# Patient Record
Sex: Male | Born: 1935 | Race: White | Hispanic: No | Smoking: Former smoker
Health system: Southern US, Community
[De-identification: ages and names within clinical notes are randomized; demographics above are authoritative.]

## PROBLEM LIST (undated history)

## (undated) DIAGNOSIS — J90 Pleural effusion, not elsewhere classified: Secondary | ICD-10-CM

## (undated) DIAGNOSIS — I714 Abdominal aortic aneurysm, without rupture, unspecified: Secondary | ICD-10-CM

## (undated) DIAGNOSIS — I4891 Unspecified atrial fibrillation: Secondary | ICD-10-CM

## (undated) DIAGNOSIS — N529 Male erectile dysfunction, unspecified: Secondary | ICD-10-CM

## (undated) DIAGNOSIS — K769 Liver disease, unspecified: Secondary | ICD-10-CM

## (undated) DIAGNOSIS — E7800 Pure hypercholesterolemia, unspecified: Secondary | ICD-10-CM

## (undated) DIAGNOSIS — N400 Enlarged prostate without lower urinary tract symptoms: Secondary | ICD-10-CM

## (undated) DIAGNOSIS — N62 Hypertrophy of breast: Secondary | ICD-10-CM

## (undated) DIAGNOSIS — R011 Cardiac murmur, unspecified: Secondary | ICD-10-CM

## (undated) DIAGNOSIS — J8410 Pulmonary fibrosis, unspecified: Secondary | ICD-10-CM

## (undated) DIAGNOSIS — M1990 Unspecified osteoarthritis, unspecified site: Secondary | ICD-10-CM

## (undated) DIAGNOSIS — I719 Aortic aneurysm of unspecified site, without rupture: Secondary | ICD-10-CM

## (undated) DIAGNOSIS — K449 Diaphragmatic hernia without obstruction or gangrene: Secondary | ICD-10-CM

## (undated) DIAGNOSIS — I4510 Unspecified right bundle-branch block: Secondary | ICD-10-CM

## (undated) DIAGNOSIS — IMO0001 Reserved for inherently not codable concepts without codable children: Secondary | ICD-10-CM

## (undated) DIAGNOSIS — H353290 Exudative age-related macular degeneration, unspecified eye, stage unspecified: Secondary | ICD-10-CM

## (undated) DIAGNOSIS — S72002A Fracture of unspecified part of neck of left femur, initial encounter for closed fracture: Secondary | ICD-10-CM

## (undated) DIAGNOSIS — G629 Polyneuropathy, unspecified: Secondary | ICD-10-CM

## (undated) DIAGNOSIS — R7989 Other specified abnormal findings of blood chemistry: Secondary | ICD-10-CM

## (undated) DIAGNOSIS — E119 Type 2 diabetes mellitus without complications: Secondary | ICD-10-CM

## (undated) DIAGNOSIS — R778 Other specified abnormalities of plasma proteins: Secondary | ICD-10-CM

## (undated) HISTORY — PX: NOSE SURGERY: SHX723

## (undated) HISTORY — DX: Diaphragmatic hernia without obstruction or gangrene: K44.9

## (undated) HISTORY — DX: Exudative age-related macular degeneration, unspecified eye, stage unspecified: H35.3290

## (undated) HISTORY — DX: Unspecified right bundle-branch block: I45.10

## (undated) HISTORY — DX: Pulmonary fibrosis, unspecified: J84.10

## (undated) HISTORY — DX: Pure hypercholesterolemia, unspecified: E78.00

## (undated) HISTORY — DX: Abdominal aortic aneurysm, without rupture, unspecified: I71.40

## (undated) HISTORY — PX: SHOULDER SURGERY: SHX246

## (undated) HISTORY — DX: Male erectile dysfunction, unspecified: N52.9

## (undated) HISTORY — DX: Hypertrophy of breast: N62

## (undated) HISTORY — DX: Liver disease, unspecified: K76.9

## (undated) HISTORY — DX: Polyneuropathy, unspecified: G62.9

## (undated) HISTORY — DX: Cardiac murmur, unspecified: R01.1

## (undated) HISTORY — DX: Type 2 diabetes mellitus without complications: E11.9

## (undated) HISTORY — PX: COLOSTOMY REVERSAL: SHX5782

## (undated) HISTORY — DX: Abdominal aortic aneurysm, without rupture: I71.4

## (undated) HISTORY — DX: Unspecified atrial fibrillation: I48.91

---

## 1958-07-29 HISTORY — PX: COLOSTOMY: SHX63

## 2004-05-28 ENCOUNTER — Encounter: Admission: RE | Admit: 2004-05-28 | Discharge: 2004-05-28 | Payer: Self-pay | Admitting: Geriatric Medicine

## 2006-11-03 ENCOUNTER — Encounter: Admission: RE | Admit: 2006-11-03 | Discharge: 2006-11-03 | Payer: Self-pay | Admitting: Geriatric Medicine

## 2006-11-20 ENCOUNTER — Encounter: Admission: RE | Admit: 2006-11-20 | Discharge: 2006-11-20 | Payer: Self-pay | Admitting: Geriatric Medicine

## 2006-11-27 ENCOUNTER — Ambulatory Visit (HOSPITAL_COMMUNITY): Admission: RE | Admit: 2006-11-27 | Discharge: 2006-11-27 | Payer: Self-pay | Admitting: Chiropractic Medicine

## 2007-06-03 ENCOUNTER — Encounter: Admission: RE | Admit: 2007-06-03 | Discharge: 2007-06-03 | Payer: Self-pay | Admitting: Geriatric Medicine

## 2007-07-15 ENCOUNTER — Encounter: Admission: RE | Admit: 2007-07-15 | Discharge: 2007-07-15 | Payer: Self-pay | Admitting: Gastroenterology

## 2008-07-07 ENCOUNTER — Encounter: Admission: RE | Admit: 2008-07-07 | Discharge: 2008-07-07 | Payer: Self-pay | Admitting: Geriatric Medicine

## 2009-07-10 ENCOUNTER — Encounter: Admission: RE | Admit: 2009-07-10 | Discharge: 2009-07-10 | Payer: Self-pay | Admitting: Geriatric Medicine

## 2010-07-25 ENCOUNTER — Ambulatory Visit: Payer: Self-pay | Admitting: Cardiology

## 2011-07-19 ENCOUNTER — Other Ambulatory Visit (HOSPITAL_COMMUNITY): Payer: Self-pay | Admitting: Geriatric Medicine

## 2011-08-05 ENCOUNTER — Ambulatory Visit (HOSPITAL_COMMUNITY)
Admission: RE | Admit: 2011-08-05 | Discharge: 2011-08-05 | Disposition: A | Discharge status: Home / Self Care | Payer: Medicare Other | Source: Ambulatory Visit | Attending: Geriatric Medicine | Admitting: Geriatric Medicine

## 2011-08-05 DIAGNOSIS — K224 Dyskinesia of esophagus: Secondary | ICD-10-CM | POA: Insufficient documentation

## 2011-08-05 DIAGNOSIS — R1310 Dysphagia, unspecified: Secondary | ICD-10-CM | POA: Insufficient documentation

## 2011-08-05 DIAGNOSIS — K225 Diverticulum of esophagus, acquired: Secondary | ICD-10-CM | POA: Insufficient documentation

## 2013-08-18 ENCOUNTER — Other Ambulatory Visit: Payer: Self-pay | Admitting: Geriatric Medicine

## 2013-08-18 DIAGNOSIS — I714 Abdominal aortic aneurysm, without rupture, unspecified: Secondary | ICD-10-CM

## 2013-08-18 DIAGNOSIS — N281 Cyst of kidney, acquired: Secondary | ICD-10-CM

## 2013-08-23 ENCOUNTER — Other Ambulatory Visit: Payer: Self-pay | Admitting: Geriatric Medicine

## 2013-08-23 DIAGNOSIS — I714 Abdominal aortic aneurysm, without rupture, unspecified: Secondary | ICD-10-CM

## 2013-08-25 ENCOUNTER — Other Ambulatory Visit: Payer: Medicare Other

## 2013-08-26 ENCOUNTER — Other Ambulatory Visit: Payer: Self-pay | Admitting: Nurse Practitioner

## 2013-08-26 ENCOUNTER — Ambulatory Visit
Admission: RE | Admit: 2013-08-26 | Discharge: 2013-08-26 | Disposition: A | Discharge status: Home / Self Care | Payer: Medicare Other | Source: Ambulatory Visit | Attending: Nurse Practitioner | Admitting: Nurse Practitioner

## 2013-08-26 DIAGNOSIS — J069 Acute upper respiratory infection, unspecified: Secondary | ICD-10-CM

## 2013-08-27 ENCOUNTER — Ambulatory Visit
Admission: RE | Admit: 2013-08-27 | Discharge: 2013-08-27 | Disposition: A | Discharge status: Home / Self Care | Payer: Medicare Other | Source: Ambulatory Visit | Attending: Geriatric Medicine | Admitting: Geriatric Medicine

## 2013-08-27 ENCOUNTER — Other Ambulatory Visit: Payer: Self-pay | Admitting: Geriatric Medicine

## 2013-08-27 DIAGNOSIS — I714 Abdominal aortic aneurysm, without rupture, unspecified: Secondary | ICD-10-CM

## 2013-08-27 DIAGNOSIS — R918 Other nonspecific abnormal finding of lung field: Secondary | ICD-10-CM

## 2013-08-27 MED ORDER — IOHEXOL 300 MG/ML  SOLN
125.0000 mL | Freq: Once | INTRAMUSCULAR | Status: AC | PRN
  Administered 2013-08-27: 125 mL via INTRAVENOUS

## 2013-08-27 MED ORDER — IOHEXOL 300 MG/ML  SOLN: 100.0000 mL | Freq: Once | INTRAMUSCULAR | Status: DC | PRN

## 2013-08-30 ENCOUNTER — Ambulatory Visit
Admission: RE | Admit: 2013-08-30 | Discharge: 2013-08-30 | Disposition: A | Discharge status: Home / Self Care | Payer: Medicare Other | Source: Ambulatory Visit | Attending: Geriatric Medicine | Admitting: Geriatric Medicine

## 2013-08-30 DIAGNOSIS — R918 Other nonspecific abnormal finding of lung field: Secondary | ICD-10-CM

## 2013-08-30 MED ORDER — IOHEXOL 300 MG/ML  SOLN
75.0000 mL | Freq: Once | INTRAMUSCULAR | Status: AC | PRN
  Administered 2013-08-30: 75 mL via INTRAVENOUS

## 2013-09-24 ENCOUNTER — Encounter: Payer: Self-pay | Admitting: *Deleted

## 2013-09-27 ENCOUNTER — Encounter: Payer: Self-pay | Admitting: Pulmonary Disease

## 2013-09-27 ENCOUNTER — Ambulatory Visit (INDEPENDENT_AMBULATORY_CARE_PROVIDER_SITE_OTHER): Payer: Medicare Other | Admitting: Pulmonary Disease

## 2013-09-27 ENCOUNTER — Other Ambulatory Visit (INDEPENDENT_AMBULATORY_CARE_PROVIDER_SITE_OTHER): Payer: Medicare Other

## 2013-09-27 VITALS — BP 128/60 | HR 55 | Temp 97.4°F | Ht 71.25 in | Wt 209.0 lb

## 2013-09-27 DIAGNOSIS — J849 Interstitial pulmonary disease, unspecified: Secondary | ICD-10-CM

## 2013-09-27 DIAGNOSIS — R918 Other nonspecific abnormal finding of lung field: Secondary | ICD-10-CM

## 2013-09-27 DIAGNOSIS — J8410 Pulmonary fibrosis, unspecified: Secondary | ICD-10-CM

## 2013-09-27 LAB — RHEUMATOID FACTOR: Rhuematoid fact SerPl-aCnc: 39 IU/mL — ABNORMAL HIGH (ref <=14)

## 2013-09-27 LAB — C-REACTIVE PROTEIN: CRP: 1 mg/dL (ref 0.5–20.0)

## 2013-09-27 LAB — SEDIMENTATION RATE: Sed Rate: 26 mm/hr — ABNORMAL HIGH (ref 0–22)

## 2013-09-27 NOTE — Patient Instructions (Signed)
Will check bloodwork to look for autoimmune diseases. Will schedule for breathing studies in 2-3 weeks, with followup with me on the same day to review.

## 2013-09-27 NOTE — Addendum Note (Signed)
Addended by: Virl Cagey on: 09/27/2013 03:10 PM   Modules accepted: Orders

## 2013-09-27 NOTE — Assessment & Plan Note (Addendum)
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 This dates back to at least 2008, but I cannot find a chest CT for comparison in order to determine disease progression.  He denies any of the classic causes for pulmonary fibrosis, and therefore this most likely represents idiopathic pulmonary fibrosis. We do need to check autoimmune blood work, and also need to do pulmonary function studies to get some idea how much this may be impacting his lung function. I will then see the patient back, and we can discuss prognosis and whether to consider various treatment options.  My only other thought is whether some of this could be due to chronic microaspiration.  The pt does have a h/o esophageal dysmotility on BS, but denies issues with aspiration.

## 2013-09-27 NOTE — Progress Notes (Signed)
Subjective:    Patient ID: [name] [name], male    DOB: 17-May-1927, 80 y.o.   MRN: [id number]  HPI The patient is an 80 year old male who I've been asked to see for an abnormal CT chest. He recently was found to have interstitial changes on a chest x-ray, and this was followed by a CT chest. This showed subpleural fibrosis, as well as honeycombing and traction bronchiectasis. The pattern was most consistent with usual interstitial pneumonitis. He was also noted to have bilateral pleural based densities which involved the major fissure, and it is unclear whether this is part of his overall fibrotic process. A review of his x-ray shows the patient had some degree of fibrosis dating back to an abdominal CT in 2008. Currently, the patient feels that his exertional tolerance is similar to the past with respect to shortness of breath, but has noticed that he gets much more fatigued. He describes a 3 block dyspnea on exertion at a moderate pace on flat ground, and will also get winded walking up one flight of stairs. He denies any significant cough except in the mornings upon arising, and it is typically dry. He will produce mucus that he's had issues with postnasal drip. The patient denies ever being on Macrodantin or amiodarone for any prolonged period of time. He has never been diagnosed with rheumatoid arthritis or other autoimmune process. He has no family history for this either. He does have an issue with swallowing at times, but denies any problem with aspiration. He did have a barium swallow in 2013 which showed esophageal dysmotility. He has never worked in Psychologist, educational, and denies any exposure to asbestos. His father did run an Geophysicist/field seismologist, and he helped out with this. He is unsure if he ever did significant brake work.  The patient does have a long standing history of smoking, and continues to do so.   Review of Systems  Constitutional: Negative for fever and unexpected weight change.   HENT: Positive for rhinorrhea. Negative for congestion, dental problem, ear pain, nosebleeds, postnasal drip, sinus pressure, sneezing, sore throat and trouble swallowing.   Eyes: Negative for redness and itching.  Respiratory: Negative for cough, chest tightness, shortness of breath and wheezing.        Increased mucus  Cardiovascular: Negative for palpitations and leg swelling.  Gastrointestinal: Negative for nausea and vomiting.  Genitourinary: Negative for dysuria.  Musculoskeletal: Negative for joint swelling.  Skin: Negative for rash.  Neurological: Negative for headaches.  Hematological: Does not bruise/bleed easily.  Psychiatric/Behavioral: Negative for dysphoric mood. The patient is not nervous/anxious.        Objective:   Physical Exam Constitutional:  Thin male, no acute distress  HENT:  Nares patent without discharge  Oropharynx without exudate, palate and uvula are normal  Eyes:  Perrla, eomi, no scleral icterus  Neck:  No JVD, no TMG  Cardiovascular:  Normal rate, regular rhythm, no rubs or gallops.  1/6 sem        Intact distal pulses but dimished.  Pulmonary :  Normal breath sounds, no stridor or respiratory distress   No rhonchi, or wheezing.  Crackles both bases.   Abdominal:  Soft, nondistended, bowel sounds present.  No tenderness noted.   Musculoskeletal:  Minimal ankle edema noted.  Lymph Nodes:  No cervical lymphadenopathy noted  Skin:  No cyanosis noted  Neurologic:  Alert, appropriate, moves all 4 extremities without obvious deficit.         Assessment & Plan:

## 2013-09-27 NOTE — Assessment & Plan Note (Signed)
The patient has bilateral pleural based densities that are associated with the major fissure, but he denies any history of asbestos exposure. The abnormality on the right is a little more concerning, and I cannot exclude the possibility of a pleural based mass given his prior smoking history. We will need to follow this with a repeat CT in approximately 6 months.

## 2013-09-28 LAB — CYCLIC CITRUL PEPTIDE ANTIBODY, IGG: Cyclic Citrullin Peptide Ab: >300 U/mL — ABNORMAL HIGH (ref 0.0–5.0)

## 2013-09-28 LAB — ANA: ANA: NEGATIVE

## 2013-09-28 LAB — ANTI-SCLERODERMA ANTIBODY: Scleroderma (Scl-70) (ENA) Antibody, IgG: <1

## 2013-09-28 LAB — CK TOTAL AND CKMB (NOT AT ARMC)
CK, MB: 1.1 ng/mL (ref 0.0–5.0)
Total CK: 28 U/L (ref 7–232)

## 2013-09-28 LAB — JO-1 ANTIBODY-IGG: Jo-1 Antibody, IgG: <1

## 2013-09-28 LAB — RNP ANTIBODY: Ribonucleic Protein(ENA) Antibody, IgG: <1

## 2013-09-29 LAB — ALDOLASE: ALDOLASE: 5.3 U/L (ref <=8.1)

## 2013-10-01 ENCOUNTER — Emergency Department (HOSPITAL_COMMUNITY): Payer: Medicare Other

## 2013-10-01 ENCOUNTER — Emergency Department (HOSPITAL_COMMUNITY)
Admission: EM | Admit: 2013-10-01 | Discharge: 2013-10-01 | Disposition: A | Discharge status: Home / Self Care | Payer: Medicare Other | Attending: Emergency Medicine | Admitting: Emergency Medicine

## 2013-10-01 ENCOUNTER — Encounter (HOSPITAL_COMMUNITY): Payer: Self-pay | Admitting: Emergency Medicine

## 2013-10-01 DIAGNOSIS — W010XXA Fall on same level from slipping, tripping and stumbling without subsequent striking against object, initial encounter: Secondary | ICD-10-CM | POA: Insufficient documentation

## 2013-10-01 DIAGNOSIS — S62639A Displaced fracture of distal phalanx of unspecified finger, initial encounter for closed fracture: Secondary | ICD-10-CM | POA: Insufficient documentation

## 2013-10-01 DIAGNOSIS — Z7982 Long term (current) use of aspirin: Secondary | ICD-10-CM | POA: Insufficient documentation

## 2013-10-01 DIAGNOSIS — S62509A Fracture of unspecified phalanx of unspecified thumb, initial encounter for closed fracture: Secondary | ICD-10-CM

## 2013-10-01 DIAGNOSIS — N529 Male erectile dysfunction, unspecified: Secondary | ICD-10-CM | POA: Insufficient documentation

## 2013-10-01 DIAGNOSIS — E119 Type 2 diabetes mellitus without complications: Secondary | ICD-10-CM | POA: Insufficient documentation

## 2013-10-01 DIAGNOSIS — R42 Dizziness and giddiness: Secondary | ICD-10-CM | POA: Insufficient documentation

## 2013-10-01 DIAGNOSIS — Z8709 Personal history of other diseases of the respiratory system: Secondary | ICD-10-CM | POA: Insufficient documentation

## 2013-10-01 DIAGNOSIS — Z791 Long term (current) use of non-steroidal anti-inflammatories (NSAID): Secondary | ICD-10-CM | POA: Insufficient documentation

## 2013-10-01 DIAGNOSIS — H353290 Exudative age-related macular degeneration, unspecified eye, stage unspecified: Secondary | ICD-10-CM | POA: Insufficient documentation

## 2013-10-01 DIAGNOSIS — W1809XA Striking against other object with subsequent fall, initial encounter: Secondary | ICD-10-CM | POA: Insufficient documentation

## 2013-10-01 DIAGNOSIS — N62 Hypertrophy of breast: Secondary | ICD-10-CM | POA: Insufficient documentation

## 2013-10-01 DIAGNOSIS — E7800 Pure hypercholesterolemia, unspecified: Secondary | ICD-10-CM | POA: Insufficient documentation

## 2013-10-01 DIAGNOSIS — Y9289 Other specified places as the place of occurrence of the external cause: Secondary | ICD-10-CM | POA: Insufficient documentation

## 2013-10-01 DIAGNOSIS — Z79899 Other long term (current) drug therapy: Secondary | ICD-10-CM | POA: Insufficient documentation

## 2013-10-01 DIAGNOSIS — S0990XA Unspecified injury of head, initial encounter: Secondary | ICD-10-CM | POA: Insufficient documentation

## 2013-10-01 DIAGNOSIS — W19XXXA Unspecified fall, initial encounter: Secondary | ICD-10-CM

## 2013-10-01 DIAGNOSIS — Y9389 Activity, other specified: Secondary | ICD-10-CM | POA: Insufficient documentation

## 2013-10-01 DIAGNOSIS — Z8719 Personal history of other diseases of the digestive system: Secondary | ICD-10-CM | POA: Insufficient documentation

## 2013-10-01 DIAGNOSIS — I4891 Unspecified atrial fibrillation: Secondary | ICD-10-CM | POA: Insufficient documentation

## 2013-10-01 DIAGNOSIS — R011 Cardiac murmur, unspecified: Secondary | ICD-10-CM | POA: Insufficient documentation

## 2013-10-01 DIAGNOSIS — F17200 Nicotine dependence, unspecified, uncomplicated: Secondary | ICD-10-CM | POA: Insufficient documentation

## 2013-10-01 LAB — CBC WITH DIFFERENTIAL/PLATELET
Basophils Absolute: 0 10*3/uL (ref 0.0–0.1)
Basophils Relative: 0 % (ref 0–1)
Eosinophils Absolute: 0 10*3/uL (ref 0.0–0.7)
Eosinophils Relative: 1 % (ref 0–5)
HEMATOCRIT: 35.2 % — AB (ref 39.0–52.0)
Hemoglobin: 12.2 g/dL — ABNORMAL LOW (ref 13.0–17.0)
LYMPHS ABS: 1.3 10*3/uL (ref 0.7–4.0)
Lymphocytes Relative: 22 % (ref 12–46)
MCH: 30.7 pg (ref 26.0–34.0)
MCHC: 34.7 g/dL (ref 30.0–36.0)
MCV: 88.4 fL (ref 78.0–100.0)
MONO ABS: 0.8 10*3/uL (ref 0.1–1.0)
Monocytes Relative: 13 % — ABNORMAL HIGH (ref 3–12)
NEUTROS ABS: 3.9 10*3/uL (ref 1.7–7.7)
NEUTROS PCT: 64 % (ref 43–77)
Platelets: 117 10*3/uL — ABNORMAL LOW (ref 150–400)
RBC: 3.98 MIL/uL — AB (ref 4.22–5.81)
RDW: 13.7 % (ref 11.5–15.5)
WBC: 6.1 10*3/uL (ref 4.0–10.5)

## 2013-10-01 LAB — TYPE AND SCREEN
ABO/RH(D): A POS
Antibody Screen: NEGATIVE

## 2013-10-01 LAB — BASIC METABOLIC PANEL
BUN: 16 mg/dL (ref 6–23)
CHLORIDE: 104 meq/L (ref 96–112)
CO2: 26 mEq/L (ref 19–32)
Calcium: 9.3 mg/dL (ref 8.4–10.5)
Creatinine, Ser: 0.84 mg/dL (ref 0.50–1.35)
GFR, EST NON AFRICAN AMERICAN: 78 mL/min — AB (ref >90)
GLUCOSE: 174 mg/dL — AB (ref 70–99)
Potassium: 4.8 mEq/L (ref 3.7–5.3)
SODIUM: 140 meq/L (ref 137–147)

## 2013-10-01 LAB — ABO/RH: ABO/RH(D): A POS

## 2013-10-01 LAB — PROTIME-INR
INR: 1.06 (ref 0.00–1.49)
Prothrombin Time: 13.6 seconds (ref 11.6–15.2)

## 2013-10-01 MED ORDER — MORPHINE SULFATE 4 MG/ML IJ SOLN
4.0000 mg | Freq: Once | INTRAMUSCULAR | Status: AC
  Administered 2013-10-01: 4 mg via INTRAVENOUS
  Filled 2013-10-01: qty 1

## 2013-10-01 MED ORDER — HYDROCODONE-ACETAMINOPHEN 5-325 MG PO TABS: 1.0000 | ORAL_TABLET | Freq: Four times a day (QID) | ORAL | Status: DC | PRN

## 2013-10-01 MED ORDER — ONDANSETRON HCL 4 MG/2ML IJ SOLN
4.0000 mg | Freq: Once | INTRAMUSCULAR | Status: AC
  Administered 2013-10-01: 4 mg via INTRAVENOUS
  Filled 2013-10-01: qty 2

## 2013-10-01 MED ORDER — ONDANSETRON HCL 4 MG PO TABS: 4.0000 mg | ORAL_TABLET | Freq: Four times a day (QID) | ORAL | Status: DC

## 2013-10-01 NOTE — Discharge Instructions (Signed)
Thumb Fracture  There are many types of thumb fractures (breaks). There are different ways of treating these fractures, all of which may be correct, varying from case to case. Your caregiver will discuss different ways to treat these fractures with you. TREATMENT   Immobilization. This means the fracture is casted as it is without changing the positions of the fracture (bone pieces) involved. This fracture is casted in a "thumb spica" also called a hitchhiker cast. It is generally left on for 2 to 6 weeks.  Closed reduction. The bones are manipulated back into position without using surgery.  ORIF (open reduction and internal fixation). The fracture site is opened and the bone pieces are fixed into place with some type of hardware such as screws or wires. Your caregiver will discuss the type of fracture you have and the treatment that will be best for that problem. If surgery is the treatment of choice, the following is information for you to know and to let your caregiver know about prior to surgery. LET YOUR CAREGIVERS KNOW ABOUT:  Allergies.  Medications taken including herbs, eye drops, over the counter medications, and creams.  Use of steroids (by mouth or creams).  Previous problems with anesthetics or Novocain.  Family history of anesthetic complications..  Possibility of pregnancy, if this applies.  History of blood clots (thrombophlebitis).  History of bleeding or blood problems.  Previous surgery.  Other health problems. AFTER THE PROCEDURE  After surgery, you will be taken to the recovery area. A nurse will watch and check your progress. Once you are awake, stable, and taking fluids well, barring other problems you will be allowed to go home. Once home, an ice pack applied to your operative site may help with discomfort and keep the swelling down. Elevate your hand above your heart as much as possible for the first 4-5 days after the injury/surgery. HOME CARE INSTRUCTIONS     Follow your caregiver's instructions as to activities, exercises, physical therapy, and driving a car.  Use thumb and exercise as directed.  Only take over-the-counter or prescription medicines for pain, discomfort, or fever as directed by your caregiver. Do not take aspirin until your caregiver instructs. This can increase bleeding immediately following surgery. SEEK MEDICAL CARE IF:   There is increased bleeding (more than a small spot) from the wound or from beneath your cast or splint.  There is redness, swelling, or increasing pain in the wound or from beneath your cast or splint.  You have pus coming from wound or from beneath your cast or splint.  An unexplained oral temperature above 102 F (38.9 C) develops.  There is a foul smell coming from the wound or dressing or from beneath your cast or splint. SEEK IMMEDIATE MEDICAL CARE IF:   You develop severe pain, decreased sensation such as numbness or tingling.  You develop a rash.  You have difficulty breathing.  Youhave any allergic problems. If you do not have a window in your cast for observing the wound, a discharge or minor bleeding may show up as a stain on the outside of your cast. Report these findings to your caregiver. If you have a removable splint overlying the surgical dressings it is common to see a small amount of bleeding. Change the dressings as instructed by your caregiver. Document Released: 04/13/2003 Document Revised: 10/07/2011 Document Reviewed: 11/23/2007 [facility] Patient Information 2014 [place].  Fall Prevention and Home Safety Falls cause injuries and can affect all age groups. It is possible  to prevent falls.  HOW TO PREVENT FALLS  Wear shoes with rubber soles that do not have an opening for your toes.  Keep the inside and outside of your house well lit.  Use night lights throughout your home.  Remove clutter from floors.  Clean up floor spills.  Remove throw rugs or fasten  them to the floor with carpet tape.  Do not place electrical cords across pathways.  Put grab bars by your tub, shower, and toilet. Do not use towel bars as grab bars.  Put handrails on both sides of the stairway. Fix loose handrails.  Do not climb on stools or stepladders, if possible.  Do not wax your floors.  Repair uneven or unsafe sidewalks, walkways, or stairs.  Keep items you use a lot within reach.  Be aware of pets.  Keep emergency numbers next to the telephone.  Put smoke detectors in your home and near bedrooms. Ask your doctor what other things you can do to prevent falls. Document Released: 05/11/2009 Document Revised: 01/14/2012 Document Reviewed: 10/15/2011 [facility] Patient Information 2014 [place], [place].

## 2013-10-01 NOTE — ED Notes (Signed)
Dr. Burney Gauze at the bedside.

## 2013-10-01 NOTE — ED Notes (Addendum)
Pt tripped over the door sill last night and fell. He states he hit the back of his head but did not black out. His son ran in the room and helped him get up immediately. He woke today and felt dizzy, and c/o L thumb, R shoulder and R groin/thigh pain pain since. He states he is unable to bear any weight on R leg due to pain. A&Ox4 now.

## 2013-10-01 NOTE — ED Provider Notes (Signed)
CSN: [id number]     Arrival date & time 10/01/13  3716 History   First MD Initiated Contact with Patient 10/01/13 1128     Chief Complaint  Patient presents with  . Fall     (Consider location/radiation/quality/duration/timing/severity/associated sxs/prior Treatment) HPI Comments: The patient is an 80 year old male with history diabetes, heat or fibrillation, gynecomastia, right bundle branch block, hyperlipidemia, AAA, neuropathy who presents today after a fall last night. He reports that he was trying to take off his shirt which was over his head and eyes when he tripped on a lip in the floor. He fell to the ground and landed on his right side. His son was in the other room and was able to immediately help him up. There was no LOC, confusion, or disorientation. He has pain to his right thumb, shoulder, and hip. He has not been able to bear weight since the time of the fall. He has had some lightheadedness since the time of the fall. He denies any sensitivity to light. He has decreased sensation in his lower legs bilaterally, however this is chronic for his due to his diabetes. He feels like this is slightly worse since the time of the fall. He denies chest pain, shortness of breath, fever, chills, nausea, vomiting.   The history is provided by the patient. No language interpreter was used.    Past Medical History  Diagnosis Date  . Diabetes   . Atrial fibrillation   . Gynecomastia   . RBBB   . High cholesterol   . Hiatal hernia   . AAA (abdominal aortic aneurysm)   . Heart murmur   . Neuropathic impotence   . Neuropathy   . Macular degeneration, wet   . Pulmonary fibrosis   . Liver disease    Past Surgical History  Procedure Laterality Date  . Colostomy  1960  . Shoulder surgery Right   . Nose surgery      multiple times--broken nose/lacerations   Family History  Problem Relation Age of Onset  . Rectal cancer Father   . COPD Brother    History  Substance Use Topics  .  Smoking status: Current Every Day Smoker -- 0.25 packs/day for 63 years    Types: Cigarettes  . Smokeless tobacco: Not on file     Comment: smoke about 5-6 cigs qd  . Alcohol Use: No     Comment: quit 8 years ago    Review of Systems  Constitutional: Negative for fever and chills.  Respiratory: Negative for shortness of breath.   Cardiovascular: Negative for chest pain.  Gastrointestinal: Negative for nausea, vomiting and abdominal pain.  Musculoskeletal: Positive for arthralgias, gait problem, joint swelling and myalgias.  Neurological: Positive for light-headedness.  All other systems reviewed and are negative.      Allergies  Review of patient's allergies indicates no known allergies.  Home Medications   Current Outpatient Rx  Name  Route  Sig  Dispense  Refill  . 5-HTP-VALERIAN-NIACIN-B6-MG PO   Oral   Take 1 tablet by mouth daily.         . Acetylcarnitine HCl (ACETYL-L-CARNITINE HCL) POWD   Does not apply   by Does not apply route daily.         Marland Kitchen antiseptic oral rinse (BIOTENE) LIQD   Mouth Rinse   15 mLs by Mouth Rinse route as needed for dry mouth.         . Artificial Saliva (BIOTENE DRY MOUTH) GUM  Mouth/Throat   Use as directed in the mouth or throat as needed.         Marland Kitchen aspirin 81 MG tablet   Oral   Take 81 mg by mouth daily.         . Calcium 1500 MG tablet   Oral   Take 3,000 mg by mouth daily.         . DHA-EPA-Vit B6-B12-Folic Acid (CARDIOVID PLUS PO)   Oral   Take 3 tablets by mouth daily.         . finasteride (PROSCAR) 5 MG tablet   Oral   Take 5 mg by mouth daily.         Marland Kitchen glipiZIDE (GLUCOTROL) 5 MG tablet   Oral   Take 10 mg by mouth daily before breakfast.         . Glucosamine-Chondroit-Vit C-Mn (GLUCOSAMINE 1500 COMPLEX PO)   Oral   Take by mouth daily.         Marland Kitchen GOTU KOLA, CENTELLA ASIATICA, PO   Oral   Take 4 tablets by mouth daily.         . metFORMIN (GLUCOPHAGE) 500 MG tablet   Oral   Take  1,000 mg by mouth 2 (two) times daily with a meal.         . Multiple Vitamins-Minerals (EYE VITAMINS PO)   Oral   Take 4 tablets by mouth daily. SMOTHERS FORMULA         . naproxen sodium (ANAPROX) 220 MG tablet   Oral   Take 220 mg by mouth 2 (two) times daily with a meal.         . Omega-3 Fatty Acids (FISH OIL) 1200 MG CAPS   Oral   Take 1,200 mg by mouth daily.         . rosuvastatin (CRESTOR) 20 MG tablet   Oral   Take 20 mg by mouth daily.         Orlie Dakin Sodium (STOOL SOFTENER & LAXATIVE PO)   Oral   Take 3 capsules by mouth daily.         . sotalol (BETAPACE) 80 MG tablet   Oral   Take 80 mg by mouth 2 (two) times daily.          BP 120/66  Pulse 67  Temp(Src) 97.4 F (36.3 C) (Oral)  Resp 18  SpO2 99% Physical Exam  Nursing note and vitals reviewed. Constitutional: He is oriented to person, place, and time. He appears well-developed and well-nourished. He does not have a sickly appearance. He does not appear ill. No distress.  HENT:  Head: Normocephalic and atraumatic.  Right Ear: External ear normal.  Left Ear: External ear normal.  Nose: Nose normal.  Mouth/Throat: Uvula is midline, oropharynx is clear and moist and mucous membranes are normal.  Eyes: Conjunctivae and EOM are normal. Pupils are equal, round, and reactive to light.  Neck: Normal range of motion. No spinous process tenderness and no muscular tenderness present. No rigidity. No tracheal deviation present.  Cardiovascular: Normal rate, regular rhythm, normal heart sounds, intact distal pulses and normal pulses.   Pulses:      Radial pulses are 2+ on the right side, and 2+ on the left side.       Dorsalis pedis pulses are 2+ on the right side, and 2+ on the left side.       Posterior tibial pulses are 2+ on the right side, and 2+ on  the left side.  Pulmonary/Chest: Effort normal and breath sounds normal. No stridor.  Abdominal: Soft. He exhibits no distension. There  is no tenderness.  Musculoskeletal: Normal range of motion.  Significant amount of bruising and swelling to right thumb. Neurovascularly intact. Compartment soft. TTP.  Log roll test positive on right. TTP from right hip to right knee diffusely. Strength 5/5 in lower extremities bilaterally tested against resistance.   Neurological: He is alert and oriented to person, place, and time. He has normal strength. Coordination normal.  Finger nose finger normal. Grip strength 5/5 bilaterally.   Skin: Skin is warm and dry. He is not diaphoretic.  Psychiatric: He has a normal mood and affect. His behavior is normal.    ED Course  Procedures (including critical care time) Labs Review Labs Reviewed  BASIC METABOLIC PANEL - Abnormal; Notable for the following:    Glucose, Bld 174 (*)    GFR calc non Af Amer 78 (*)    All other components within normal limits  CBC WITH DIFFERENTIAL - Abnormal; Notable for the following:    RBC 3.98 (*)    Hemoglobin 12.2 (*)    HCT 35.2 (*)    Platelets 117 (*)    Monocytes Relative 13 (*)    All other components within normal limits  PROTIME-INR  TYPE AND SCREEN  ABO/RH   Imaging Review Dg Shoulder Right  10/01/2013   CLINICAL DATA:  Status post fall 1 day ago.  Right shoulder pain.  EXAM: RIGHT SHOULDER - 2+ VIEW  COMPARISON:  None.  FINDINGS: No acute bony or joint abnormality is identified. The patient has severe acromioclavicular osteoarthritis. Image right lung and ribs are unremarkable.  IMPRESSION: No acute finding.  Severe acromioclavicular osteoarthritis.   Electronically Signed   By: Inge Rise M.D.   On: 10/01/2013 11:02   Dg Hip Complete Right  10/01/2013   CLINICAL DATA:  Status post fall 1 day ago.  Right hip pain.  EXAM: RIGHT HIP - COMPLETE 2+ VIEW  COMPARISON:  None.  FINDINGS: No acute bony or joint abnormality is identified. Mild degenerative change is present about the hips. Lower lumbar spondylosis. Atherosclerosis is noted.   IMPRESSION: No acute finding.   Electronically Signed   By: Inge Rise M.D.   On: 10/01/2013 12:32   Ct Head Wo Contrast  10/01/2013   CLINICAL DATA:  Status post fall with a blow to the back of the head. Dizziness.  EXAM: CT HEAD WITHOUT CONTRAST  CT CERVICAL SPINE WITHOUT CONTRAST  TECHNIQUE: Multidetector CT imaging of the head and cervical spine was performed following the standard protocol without intravenous contrast. Multiplanar CT image reconstructions of the cervical spine were also generated.  COMPARISON:  None.  FINDINGS: CT HEAD FINDINGS  There is cortical atrophy and chronic microvascular ischemic change. No evidence of acute intracranial abnormality including infarction, hemorrhage, mass lesion, mass effect, midline shift or abnormal extra-axial fluid collection is identified. There is no hydrocephalus or pneumocephalus. Extensive atherosclerosis is noted. The calvarium is intact.  CT CERVICAL SPINE FINDINGS  No fracture or malalignment of the cervical spine is identified. The patient has severe multilevel degenerative disc and uncovertebral disease. Facet arthropathy appears worst at C7-T1 on the left. Lung apices are clear.  IMPRESSION: No acute finding head or cervical spine.  Marked atrophy and extensive chronic microvascular ischemic change.  Severe multilevel cervical spondylosis.   Electronically Signed   By: Inge Rise M.D.   On: 10/01/2013 11:28  Ct Cervical Spine Wo Contrast  10/01/2013   CLINICAL DATA:  Status post fall with a blow to the back of the head. Dizziness.  EXAM: CT HEAD WITHOUT CONTRAST  CT CERVICAL SPINE WITHOUT CONTRAST  TECHNIQUE: Multidetector CT imaging of the head and cervical spine was performed following the standard protocol without intravenous contrast. Multiplanar CT image reconstructions of the cervical spine were also generated.  COMPARISON:  None.  FINDINGS: CT HEAD FINDINGS  There is cortical atrophy and chronic microvascular ischemic change. No  evidence of acute intracranial abnormality including infarction, hemorrhage, mass lesion, mass effect, midline shift or abnormal extra-axial fluid collection is identified. There is no hydrocephalus or pneumocephalus. Extensive atherosclerosis is noted. The calvarium is intact.  CT CERVICAL SPINE FINDINGS  No fracture or malalignment of the cervical spine is identified. The patient has severe multilevel degenerative disc and uncovertebral disease. Facet arthropathy appears worst at C7-T1 on the left. Lung apices are clear.  IMPRESSION: No acute finding head or cervical spine.  Marked atrophy and extensive chronic microvascular ischemic change.  Severe multilevel cervical spondylosis.   Electronically Signed   By: Inge Rise M.D.   On: 10/01/2013 11:28   Dg Knee Complete 4 Views Right  10/01/2013   CLINICAL DATA:  Fall, right hip pain  EXAM: RIGHT KNEE - COMPLETE 4+ VIEW  COMPARISON:  None.  FINDINGS: Four views of the right knee submitted. Atherosclerotic calcifications of femoral and popliteal artery. Narrowing of lateral joint compartment. Mild spurring of lateral and medial tibial plateau. No joint effusion. Narrowing of patellofemoral joint space. Mild spurring lateral femoral condyle. No acute fracture or subluxation.  IMPRESSION: No acute fracture or subluxation. Osteoarthritic changes as described above.   Electronically Signed   By: Lahoma Crocker M.D.   On: 10/01/2013 12:25   Dg Hand Complete Left  10/01/2013   CLINICAL DATA:  Status post fall 1 day ago.  Left thumb pain.  EXAM: LEFT HAND - COMPLETE 3+ VIEW  COMPARISON:  None.  FINDINGS: There is a nondisplaced fracture at the base of the distal phalanx of the thumb. No other acute bony or joint abnormality is identified. Severe first CMC osteoarthritis is seen. Degenerative change is also present at the scaphoid trapezium trapezoid joint. Mild chondrocalcinosis is noted.  IMPRESSION: Nondisplaced fracture base of the distal phalanx of the thumb.   Severe first CMC osteoarthritis.  Chondrocalcinosis.   Electronically Signed   By: Inge Rise M.D.   On: 10/01/2013 11:01     EKG Interpretation None      MDM   Final diagnoses:  Fall  Thumb fracture   Patient presents to ED after fall last night. He is hemodynamically stable and neuro exam shows no focal deficit. Patient is not on anticoagulation. XR shows patient has nondisplaced fracture base of the distal phalanx of the thumb. Patient placed in a thumb spica splint and encouraged to ice and elevate finger. Patient feels improved after pain medication. XR hip and knee show no acute pathology. CT head and cervical spine show no acute abnormality. Mr. Maselli is a patient of Dr. Burney Gauze who visited him in the department. Patient will follow up with him as needed. Discussed strict reasons to return to the ED. Vital signs stable for discharge. Dr. Canary Brim evaluated patient and agrees with plan. Patient / Family / Caregiver informed of clinical course, understand medical decision-making process, and agree with plan.    Elwyn Lade, PA-C 10/01/13 [phone]

## 2013-10-01 NOTE — ED Notes (Signed)
Hannah, PA at the bedside.  

## 2013-10-01 NOTE — ED Provider Notes (Signed)
Medical screening examination/treatment/procedure(s) were conducted as a shared visit with non-physician practitioner(s) and myself.  I personally evaluated the patient during the encounter.   EKG Interpretation None     Pt seen and evaluated, alert, oriented, no acute distress, bruising on left thumb- splint has been applied, distally NVI.    Threasa Beards, MD 10/01/13 [phone]

## 2013-11-03 ENCOUNTER — Ambulatory Visit: Payer: Medicare Other | Admitting: Pulmonary Disease

## 2014-05-30 ENCOUNTER — Encounter (HOSPITAL_COMMUNITY): Payer: Self-pay

## 2014-05-30 ENCOUNTER — Emergency Department (HOSPITAL_COMMUNITY): Payer: Medicare Other

## 2014-05-30 ENCOUNTER — Inpatient Hospital Stay (HOSPITAL_COMMUNITY)
Admission: EM | Admit: 2014-05-30 | Discharge: 2014-06-06 | DRG: 956 | Disposition: A | Discharge status: Skilled Nursing Facility | Payer: Medicare Other | Attending: Internal Medicine | Admitting: Internal Medicine

## 2014-05-30 DIAGNOSIS — G92 Toxic encephalopathy: Secondary | ICD-10-CM | POA: Diagnosis not present

## 2014-05-30 DIAGNOSIS — Z825 Family history of asthma and other chronic lower respiratory diseases: Secondary | ICD-10-CM

## 2014-05-30 DIAGNOSIS — N390 Urinary tract infection, site not specified: Secondary | ICD-10-CM | POA: Diagnosis not present

## 2014-05-30 DIAGNOSIS — G8929 Other chronic pain: Secondary | ICD-10-CM | POA: Diagnosis present

## 2014-05-30 DIAGNOSIS — E785 Hyperlipidemia, unspecified: Secondary | ICD-10-CM | POA: Diagnosis present

## 2014-05-30 DIAGNOSIS — H3532 Exudative age-related macular degeneration: Secondary | ICD-10-CM | POA: Diagnosis present

## 2014-05-30 DIAGNOSIS — Z66 Do not resuscitate: Secondary | ICD-10-CM | POA: Diagnosis present

## 2014-05-30 DIAGNOSIS — K5900 Constipation, unspecified: Secondary | ICD-10-CM | POA: Diagnosis not present

## 2014-05-30 DIAGNOSIS — Z79899 Other long term (current) drug therapy: Secondary | ICD-10-CM

## 2014-05-30 DIAGNOSIS — S32401A Unspecified fracture of right acetabulum, initial encounter for closed fracture: Secondary | ICD-10-CM | POA: Diagnosis present

## 2014-05-30 DIAGNOSIS — N400 Enlarged prostate without lower urinary tract symptoms: Secondary | ICD-10-CM | POA: Diagnosis present

## 2014-05-30 DIAGNOSIS — R079 Chest pain, unspecified: Secondary | ICD-10-CM

## 2014-05-30 DIAGNOSIS — J849 Interstitial pulmonary disease, unspecified: Secondary | ICD-10-CM | POA: Diagnosis present

## 2014-05-30 DIAGNOSIS — Z8781 Personal history of (healed) traumatic fracture: Secondary | ICD-10-CM

## 2014-05-30 DIAGNOSIS — E119 Type 2 diabetes mellitus without complications: Secondary | ICD-10-CM

## 2014-05-30 DIAGNOSIS — E780 Pure hypercholesterolemia: Secondary | ICD-10-CM | POA: Diagnosis present

## 2014-05-30 DIAGNOSIS — Z01811 Encounter for preprocedural respiratory examination: Secondary | ICD-10-CM

## 2014-05-30 DIAGNOSIS — E1142 Type 2 diabetes mellitus with diabetic polyneuropathy: Secondary | ICD-10-CM | POA: Diagnosis present

## 2014-05-30 DIAGNOSIS — J9601 Acute respiratory failure with hypoxia: Secondary | ICD-10-CM | POA: Diagnosis present

## 2014-05-30 DIAGNOSIS — S72002A Fracture of unspecified part of neck of left femur, initial encounter for closed fracture: Secondary | ICD-10-CM | POA: Diagnosis not present

## 2014-05-30 DIAGNOSIS — E559 Vitamin D deficiency, unspecified: Secondary | ICD-10-CM | POA: Diagnosis present

## 2014-05-30 DIAGNOSIS — S72009A Fracture of unspecified part of neck of unspecified femur, initial encounter for closed fracture: Secondary | ICD-10-CM | POA: Diagnosis present

## 2014-05-30 DIAGNOSIS — W19XXXA Unspecified fall, initial encounter: Secondary | ICD-10-CM

## 2014-05-30 DIAGNOSIS — E872 Acidosis: Secondary | ICD-10-CM | POA: Diagnosis present

## 2014-05-30 DIAGNOSIS — J441 Chronic obstructive pulmonary disease with (acute) exacerbation: Secondary | ICD-10-CM | POA: Insufficient documentation

## 2014-05-30 DIAGNOSIS — I248 Other forms of acute ischemic heart disease: Secondary | ICD-10-CM | POA: Diagnosis present

## 2014-05-30 DIAGNOSIS — I4891 Unspecified atrial fibrillation: Secondary | ICD-10-CM | POA: Diagnosis present

## 2014-05-30 DIAGNOSIS — Z8679 Personal history of other diseases of the circulatory system: Secondary | ICD-10-CM

## 2014-05-30 DIAGNOSIS — W1839XA Other fall on same level, initial encounter: Secondary | ICD-10-CM | POA: Diagnosis present

## 2014-05-30 DIAGNOSIS — R7989 Other specified abnormal findings of blood chemistry: Secondary | ICD-10-CM | POA: Diagnosis present

## 2014-05-30 DIAGNOSIS — I10 Essential (primary) hypertension: Secondary | ICD-10-CM | POA: Diagnosis present

## 2014-05-30 DIAGNOSIS — R778 Other specified abnormalities of plasma proteins: Secondary | ICD-10-CM | POA: Diagnosis present

## 2014-05-30 DIAGNOSIS — Y9389 Activity, other specified: Secondary | ICD-10-CM

## 2014-05-30 DIAGNOSIS — T50995A Adverse effect of other drugs, medicaments and biological substances, initial encounter: Secondary | ICD-10-CM | POA: Diagnosis not present

## 2014-05-30 DIAGNOSIS — J8410 Pulmonary fibrosis, unspecified: Secondary | ICD-10-CM | POA: Insufficient documentation

## 2014-05-30 DIAGNOSIS — D649 Anemia, unspecified: Secondary | ICD-10-CM | POA: Diagnosis present

## 2014-05-30 DIAGNOSIS — Y92009 Unspecified place in unspecified non-institutional (private) residence as the place of occurrence of the external cause: Secondary | ICD-10-CM

## 2014-05-30 DIAGNOSIS — M810 Age-related osteoporosis without current pathological fracture: Secondary | ICD-10-CM | POA: Diagnosis present

## 2014-05-30 DIAGNOSIS — F17210 Nicotine dependence, cigarettes, uncomplicated: Secondary | ICD-10-CM | POA: Diagnosis present

## 2014-05-30 DIAGNOSIS — M25511 Pain in right shoulder: Secondary | ICD-10-CM | POA: Diagnosis present

## 2014-05-30 DIAGNOSIS — R0902 Hypoxemia: Secondary | ICD-10-CM

## 2014-05-30 HISTORY — DX: Other specified abnormalities of plasma proteins: R77.8

## 2014-05-30 HISTORY — DX: Fracture of unspecified part of neck of left femur, initial encounter for closed fracture: S72.002A

## 2014-05-30 HISTORY — DX: Other specified abnormal findings of blood chemistry: R79.89

## 2014-05-30 LAB — CBC WITH DIFFERENTIAL/PLATELET
Basophils Absolute: 0 10*3/uL (ref 0.0–0.1)
Basophils Relative: 0 % (ref 0–1)
EOS PCT: 1 % (ref 0–5)
Eosinophils Absolute: 0.1 10*3/uL (ref 0.0–0.7)
HCT: 37.9 % — ABNORMAL LOW (ref 39.0–52.0)
Hemoglobin: 12.4 g/dL — ABNORMAL LOW (ref 13.0–17.0)
LYMPHS ABS: 1.1 10*3/uL (ref 0.7–4.0)
Lymphocytes Relative: 12 % (ref 12–46)
MCH: 29.2 pg (ref 26.0–34.0)
MCHC: 32.7 g/dL (ref 30.0–36.0)
MCV: 89.4 fL (ref 78.0–100.0)
MONO ABS: 0.5 10*3/uL (ref 0.1–1.0)
Monocytes Relative: 5 % (ref 3–12)
Neutro Abs: 7.9 10*3/uL — ABNORMAL HIGH (ref 1.7–7.7)
Neutrophils Relative %: 82 % — ABNORMAL HIGH (ref 43–77)
Platelets: 156 10*3/uL (ref 150–400)
RBC: 4.24 MIL/uL (ref 4.22–5.81)
RDW: 13.9 % (ref 11.5–15.5)
WBC: 9.6 10*3/uL (ref 4.0–10.5)

## 2014-05-30 LAB — BASIC METABOLIC PANEL
Anion gap: 14 (ref 5–15)
BUN: 18 mg/dL (ref 6–23)
CALCIUM: 8.8 mg/dL (ref 8.4–10.5)
CO2: 24 meq/L (ref 19–32)
CREATININE: 0.94 mg/dL (ref 0.50–1.35)
Chloride: 101 mEq/L (ref 96–112)
GFR calc Af Amer: 86 mL/min — ABNORMAL LOW (ref >90)
GFR calc non Af Amer: 74 mL/min — ABNORMAL LOW (ref >90)
GLUCOSE: 227 mg/dL — AB (ref 70–99)
Potassium: 4.6 mEq/L (ref 3.7–5.3)
Sodium: 139 mEq/L (ref 137–147)

## 2014-05-30 LAB — TROPONIN I: Troponin I: <0.3 ng/mL (ref <0.30)

## 2014-05-30 MED ORDER — IOHEXOL 300 MG/ML  SOLN
100.0000 mL | Freq: Once | INTRAMUSCULAR | Status: AC | PRN
  Administered 2014-05-30: 100 mL via INTRAVENOUS

## 2014-05-30 MED ORDER — CEFAZOLIN SODIUM-DEXTROSE 2-3 GM-% IV SOLR
2.0000 g | INTRAVENOUS | Status: AC
  Administered 2014-06-01: 2 g via INTRAVENOUS

## 2014-05-30 MED ORDER — MORPHINE SULFATE 4 MG/ML IJ SOLN
4.0000 mg | Freq: Once | INTRAMUSCULAR | Status: AC
Start: 2014-05-30 — End: 2014-05-30
  Administered 2014-05-30: 4 mg via INTRAVENOUS
  Filled 2014-05-30: qty 1

## 2014-05-30 MED ORDER — FENTANYL CITRATE 0.05 MG/ML IJ SOLN
50.0000 ug | Freq: Once | INTRAMUSCULAR | Status: AC
  Administered 2014-05-30: 50 ug via INTRAVENOUS
  Filled 2014-05-30: qty 2

## 2014-05-30 MED ORDER — MORPHINE SULFATE 4 MG/ML IJ SOLN
4.0000 mg | Freq: Once | INTRAMUSCULAR | Status: AC
  Administered 2014-05-30: 4 mg via INTRAVENOUS
  Filled 2014-05-30: qty 1

## 2014-05-30 NOTE — ED Notes (Signed)
Oxygen increased to 5 l and pt instructed to take deep breaths sat increased to 90%

## 2014-05-30 NOTE — Progress Notes (Signed)
Spoke with ER MD re: left hip fracture.  Will need left hip hemiarthroplasty once patient is optimized, possibly tomorrow, depending on medical status and workup.  Currently requiring oxygen, multiple medical problems.    Full consult to follow, will keep npo after midnight for possible surgery tomorrow.  Johnny Bridge, MD

## 2014-05-30 NOTE — ED Notes (Signed)
Pt to xray

## 2014-05-30 NOTE — ED Notes (Signed)
Returned from xray

## 2014-05-30 NOTE — ED Notes (Signed)
Sat at 94 % on nrb mask

## 2014-05-30 NOTE — ED Notes (Signed)
Pt. Presents to ED by [facility] with complaint of fall around 1500. Pt. Has diabetic neuropathy with shuffling gait. Tripped at doorway to home. Denies LOC or hitting head. Denies neck pain but states that he has some sacral tenderness. [facility] notes no obvious deformity of extremities. Pt. Complaint of R shoulder pain but has full ROM.

## 2014-05-30 NOTE — Consult Note (Signed)
ORTHOPAEDIC CONSULTATION  REQUESTING PHYSICIAN: Artis Delay, MD  Chief Complaint: left hip pain  HPI: [name] [name] is a 80 y.o. male who complains of acute left hip pain after a mechanical fall today.  Lives at home with children support.  Pain is located over the left hip, also has pain over the right shoulder. Difficulty with lifting the arm. Pain is rated as severe with movement, better with rest.he denies loss of consciousness, but does indicate that some of his neurologic loss of sensation in his feet may have contributed to the fall, which was secondary to to diabetic neuropathy.  He normally was able to walk with either a walker, or with no assistive device, and goes to [place] daily, and is active.  He was also found to be somewhat hypoxic today, and also complains of some abdominal pain.  Past Medical History  Diagnosis Date  . Diabetes   . Atrial fibrillation   . Gynecomastia   . RBBB   . High cholesterol   . Hiatal hernia   . AAA (abdominal aortic aneurysm)   . Heart murmur   . Neuropathic impotence   . Neuropathy   . Macular degeneration, wet   . Pulmonary fibrosis   . Liver disease    Past Surgical History  Procedure Laterality Date  . Colostomy  1960  . Shoulder surgery Right   . Nose surgery      multiple times--broken nose/lacerations   History   Social History  . Marital Status: Widowed    Spouse Name: N/A    Number of Children: N/A  . Years of Education: N/A   Occupational History  . retired    Social History Main Topics  . Smoking status: Current Every Day Smoker -- 0.50 packs/day for 63 years    Types: Cigarettes  . Smokeless tobacco: None     Comment: smoke about 5-6 cigs qd  . Alcohol Use: No     Comment: quit 8 years ago  . Drug Use: No  . Sexual Activity: None   Other Topics Concern  . None   Social History Narrative   Family History  Problem Relation Age of Onset  . Rectal cancer Father   . COPD Brother    No Known  Allergies Prior to Admission medications   Medication Sig Start Date End Date Taking? Authorizing Provider  antiseptic oral rinse (BIOTENE) LIQD 15 mLs by Mouth Rinse route as needed for dry mouth.   Yes Historical Provider, MD  docusate sodium (COLACE) 100 MG capsule Take 100 mg by mouth 2 (two) times daily as needed for mild constipation.   Yes Historical Provider, MD  finasteride (PROSCAR) 5 MG tablet Take 5 mg by mouth daily.   Yes Historical Provider, MD  glipiZIDE (GLUCOTROL XL) 10 MG 24 hr tablet Take 20 mg by mouth daily with breakfast.   Yes Historical Provider, MD  Glucosamine-Chondroit-Vit C-Mn (GLUCOSAMINE 1500 COMPLEX PO) Take 1,500 mg by mouth 2 (two) times daily.    Yes Historical Provider, MD  guaiFENesin (MUCINEX) 600 MG 12 hr tablet Take 600 mg by mouth daily as needed for to loosen phlegm.   Yes Historical Provider, MD  metFORMIN (GLUCOPHAGE-XR) 500 MG 24 hr tablet Take 1,000 mg by mouth 2 (two) times daily.    Yes Historical Provider, MD  Multiple Vitamins-Minerals (EYE VITAMINS PO) Take 1 tablet by mouth daily. SMOTHERS FORMULA   Yes Historical Provider, MD  naproxen sodium (ANAPROX) 220 MG tablet Take 440 mg by  mouth daily.    Yes Historical Provider, MD  Omega-3 Fatty Acids (FISH OIL) 1200 MG CAPS Take 1,200 mg by mouth 2 (two) times daily.    Yes Historical Provider, MD  rosuvastatin (CRESTOR) 10 MG tablet Take 10 mg by mouth daily.   Yes Historical Provider, MD  sotalol (BETAPACE) 80 MG tablet Take 80 mg by mouth 2 (two) times daily.   Yes Historical Provider, MD   Dg Thoracic Spine 2 View  05/30/2014   CLINICAL DATA:  Fall, mid back pain  EXAM: THORACIC SPINE - 2 VIEW  COMPARISON:  None.  FINDINGS: Normal thoracic kyphosis.  No evidence of fracture dislocation. Vertebral body heights are maintained.  Moderate multilevel degenerative changes.  Vascular calcifications.  Visualized lungs are clear.  IMPRESSION: No fracture or dislocation is seen.  Moderate multilevel  degenerative changes.   Electronically Signed   By: Julian Hy M.D.   On: 05/30/2014 19:23   Dg Lumbar Spine Complete  05/30/2014   CLINICAL DATA:  Fall, mid/lower back pain  EXAM: LUMBAR SPINE - COMPLETE 4+ VIEW  COMPARISON:  CT abdomen dated 08/27/2013  FINDINGS: Five lumbar type vertebral bodies.  Normal lumbar lordosis.  No evidence of fracture or dislocation. Vertebral body heights are maintained.  Grade 1 anterolisthesis of L4 on L5.  Moderate multilevel degenerative changes.  Visualized bony pelvis appears intact.  Vascular calcifications with known infrarenal abdominal aortic aneurysm.a  IMPRESSION: No fracture or dislocation is seen.  Moderate degenerative changes.  Grade 1 anterolisthesis of L4 on L5.   Electronically Signed   By: Julian Hy M.D.   On: 05/30/2014 20:01   Dg Shoulder Right  05/30/2014   CLINICAL DATA:  Fall, right shoulder pain  EXAM: RIGHT SHOULDER - 2+ VIEW  COMPARISON:  None.  FINDINGS: No fracture or dislocation is seen.  Moderate degenerative changes of the glenohumeral joint and acromioclavicular joint  The visualized soft tissues are unremarkable.  Visualized right lung is clear.  IMPRESSION: No fracture or dislocation is seen.  Moderate degenerative changes.   Electronically Signed   By: Julian Hy M.D.   On: 05/30/2014 19:22   Dg Hip Complete Left  05/30/2014   CLINICAL DATA:  Left hip pain after falling at home today at 1500 hr.  EXAM: LEFT HIP - COMPLETE 2+ VIEW  COMPARISON:  Portable pelvis obtained earlier today.  FINDINGS: Left femoral neck fracture with mild valgus angulation. Atheromatous arterial calcifications.  IMPRESSION: Previously noted mildly displaced left femoral neck fracture with mild valgus angulation.   Electronically Signed   By: Enrique Sack M.D.   On: 05/30/2014 19:21   Ct Head Wo Contrast  05/30/2014   CLINICAL DATA:  Right neck pain after falling today and hitting his shoulder against a door jamb.  EXAM: CT HEAD WITHOUT  CONTRAST  CT CERVICAL SPINE WITHOUT CONTRAST  TECHNIQUE: Multidetector CT imaging of the head and cervical spine was performed following the standard protocol without intravenous contrast. Multiplanar CT image reconstructions of the cervical spine were also generated.  COMPARISON:  10/01/2013.  FINDINGS: CT HEAD FINDINGS  Diffusely enlarged ventricles and subarachnoid spaces. Patchy white matter low density in both cerebral hemispheres. No skull fracture, intracranial hemorrhage or paranasal sinus air-fluid levels. Dense arterial calcifications at the skull base.  CT CERVICAL SPINE FINDINGS  Extensive multilevel cervical spine degenerative changes. No prevertebral soft tissue swelling, fractures or subluxations. Bilateral vertebral and carotid artery atheromatous calcifications. Bullous changes at both lung apices.  IMPRESSION: 1. No  skull fracture or intracranial hemorrhage. 2. No cervical spine fracture or subluxation. 3. Stable mid atrophy and chronic small vessel white matter ischemic changes. 4. Stable marked cervical spine degenerative changes. 5. COPD.   Electronically Signed   By: Enrique Sack M.D.   On: 05/30/2014 19:07   Ct Cervical Spine Wo Contrast  05/30/2014   CLINICAL DATA:  Right neck pain after falling today and hitting his shoulder against a door jamb.  EXAM: CT HEAD WITHOUT CONTRAST  CT CERVICAL SPINE WITHOUT CONTRAST  TECHNIQUE: Multidetector CT imaging of the head and cervical spine was performed following the standard protocol without intravenous contrast. Multiplanar CT image reconstructions of the cervical spine were also generated.  COMPARISON:  10/01/2013.  FINDINGS: CT HEAD FINDINGS  Diffusely enlarged ventricles and subarachnoid spaces. Patchy white matter low density in both cerebral hemispheres. No skull fracture, intracranial hemorrhage or paranasal sinus air-fluid levels. Dense arterial calcifications at the skull base.  CT CERVICAL SPINE FINDINGS  Extensive multilevel cervical  spine degenerative changes. No prevertebral soft tissue swelling, fractures or subluxations. Bilateral vertebral and carotid artery atheromatous calcifications. Bullous changes at both lung apices.  IMPRESSION: 1. No skull fracture or intracranial hemorrhage. 2. No cervical spine fracture or subluxation. 3. Stable mid atrophy and chronic small vessel white matter ischemic changes. 4. Stable marked cervical spine degenerative changes. 5. COPD.   Electronically Signed   By: Enrique Sack M.D.   On: 05/30/2014 19:07   Dg Pelvis Portable  05/30/2014   CLINICAL DATA:  [place] today at 3 o'clock. Tripped over a door way. Left hip pain.  EXAM: PORTABLE PELVIS 1-2 VIEWS PORTABLE ONE-VIEW CHEST X-RAY.  COMPARISON:  HIP RADIOGRAPHS 10/01/2013 AND CHEST X-RAY 08/26/2013.  FINDINGS: CHEST X-RAY:  The heart is mildly enlarged but stable. There is tortuosity and calcification of the thoracic aorta. Chronic lung changes with pulmonary fibrosis. No definite acute overlying pulmonary process. The bony thorax is grossly intact.  Pelvis:  There is a displaced left femoral neck fracture. The right hip is intact. Moderate degenerative changes bilaterally, right greater than left. The pubic symphysis and SI joints are intact. No pelvic fractures. Extensive vascular calcifications are noted.  IMPRESSION: 1. Displaced left femoral neck fracture. 2. No acute cardiopulmonary findings. Chronic lung changes/pulmonary fibrosis.   Electronically Signed   By: Kalman Jewels M.D.   On: 05/30/2014 17:36   Dg Chest Port 1 View  05/30/2014   CLINICAL DATA:  80 year old male who fell at 1500 hr today tripping over door way. Acute pain. Initial encounter.  EXAM: PORTABLE CHEST - 1 VIEW  COMPARISON:  Chest CT 08/30/2013 and earlier.  FINDINGS: Portable AP semi upright view at 1716 hr. Lower lung volumes. Stable cardiomegaly and mediastinal contours. Peripheral increased opacity greater in the left lung is stable. No pneumothorax, pulmonary edema, or  pleural effusion identified. No acute pulmonary opacity identified. Calcified atherosclerosis of the aorta. Visualized tracheal air column is within normal limits. No acute osseous abnormality identified.  IMPRESSION: Cardiomegaly and chronic lung disease. No superimposed acute findings are identified.   Electronically Signed   By: Lars Pinks M.D.   On: 05/30/2014 17:35    Positive ROS: All other systems have been reviewed and were otherwise negative with the exception of those mentioned in the HPI and as above.  Filed Vitals:   05/30/14 2121  BP: 123/50  Pulse: 78  Temp:   Resp: 21   BP 123/50 mmHg  Pulse 78  Temp(Src) 97.9 F (36.6 C) (  Oral)  Resp 21  Ht 6\' 1"  (1.854 m)  Wt 82.555 kg (182 lb)  BMI 24.02 kg/m2  SpO2 94%  Physical Exam: General: interactive, and reasonably appropriate with me although does have some mild confusion. Cardiovascular: No pedal edema Respiratory: patient is on oxygen, mild use of accessory musculature GI: No organomegaly, abdomen is mildly tender in the midline, and slightly to the right side lower quadrant. Skin: No lesions in the area of chief complaint Neurologic: mild decreased sensation throughout both lower extremities, consistent with peripheral neuropathy. Psychiatric: Patient is competent for consent with normal mood and affect Lymphatic: No axillary or cervical lymphadenopathy  MUSCULOSKELETAL: left hip is shortened, externally rotated, EHL and FHL are intact. Positive logroll. right shoulder has active motion 0-80, moderate weakness with rotator cuff testing.no gross deformity.  Assessment: Acute displaced left femoral neck fracture with some degree of hypoxia, and history of atrial fibrillation, status post ablation, with multiple other medical comorbidities including diabetes and peripheral neuropathy.  Plan: This is an acute severe injury that does have locations both from a morbidity and mortality standpoint. I recommended left hip  hemiarthroplasty in order to restore his level of function, or at least optimize his long-term function. He is currently going to CAT scan for CT the abdomen, and we will consider surgical intervention tomorrow if he is optimized.  The risks benefits and alternatives were discussed with the patient And his familyincluding but not limited to the risks of nonoperative treatment, versus surgical intervention including infection, bleeding, nerve injury, periprosthetic fracture, the need for revision surgery, dislocation, leg length discrepancy, blood clots, cardiopulmonary complications, morbidity, mortality, among others, and they were willing to proceed.    Additionally, he does have right shoulder pain although x-rays were negative, and he certainly may have some rotator cuff pathology or direct contusion, and we will continue to monitor this after we have gotten through the surgical intervention for his hip.  I will also have them place a Foley, as he has not been able to void in the last 7 hours, and coordinate surgical intervention tomorrow, it may be Dr. Edmonia Lynch actually doing surgery depending on timing, and I've discussed this with the family.    Johnny Bridge, MD Cell [phone]   05/30/2014 9:28 PM

## 2014-05-30 NOTE — ED Notes (Signed)
returned from ct

## 2014-05-30 NOTE — ED Provider Notes (Signed)
CSN: [id number]     Arrival date & time 05/30/14  1627 History   First MD Initiated Contact with Patient 05/30/14 1643     Chief Complaint  Patient presents with  . Fall     (Consider location/radiation/quality/duration/timing/severity/associated sxs/prior Treatment) HPI Comments: 80 year old male who presents after mechanical fall. He tripped over his door sill.  He denies head injury or loss of consciousness. He complains ofsevere right shoulder pain and right chest wall pain.  Patient is a 80 y.o. male presenting with fall.  Fall This is a new problem. The current episode started less than 1 hour ago. Episode frequency: once. Associated symptoms include chest pain (right sided chest wall pain). Associated symptoms comments: Right shoulder pain. Exacerbated by: pain worse with movement and palpation. Relieved by: Being still.    Past Medical History  Diagnosis Date  . Diabetes   . Atrial fibrillation   . Gynecomastia   . RBBB   . High cholesterol   . Hiatal hernia   . AAA (abdominal aortic aneurysm)   . Heart murmur   . Neuropathic impotence   . Neuropathy   . Macular degeneration, wet   . Pulmonary fibrosis   . Liver disease    Past Surgical History  Procedure Laterality Date  . Colostomy  1960  . Shoulder surgery Right   . Nose surgery      multiple times--broken nose/lacerations   Family History  Problem Relation Age of Onset  . Rectal cancer Father   . COPD Brother    History  Substance Use Topics  . Smoking status: Current Every Day Smoker -- 0.25 packs/day for 63 years    Types: Cigarettes  . Smokeless tobacco: Not on file     Comment: smoke about 5-6 cigs qd  . Alcohol Use: No     Comment: quit 8 years ago    Review of Systems  Cardiovascular: Positive for chest pain (right sided chest wall pain).  All other systems reviewed and are negative.     Allergies  Review of patient's allergies indicates no known allergies.  Home Medications   Prior  to Admission medications   Medication Sig Start Date End Date Taking? Authorizing Provider  antiseptic oral rinse (BIOTENE) LIQD 15 mLs by Mouth Rinse route as needed for dry mouth.    Historical Provider, MD  aspirin 81 MG tablet Take 81 mg by mouth daily.    Historical Provider, MD  docusate sodium (COLACE) 100 MG capsule Take 100 mg by mouth 2 (two) times daily as needed for mild constipation.    Historical Provider, MD  finasteride (PROSCAR) 5 MG tablet Take 5 mg by mouth daily.    Historical Provider, MD  glipiZIDE (GLUCOTROL XL) 10 MG 24 hr tablet Take 20 mg by mouth daily with breakfast.    Historical Provider, MD  Glucosamine-Chondroit-Vit C-Mn (GLUCOSAMINE 1500 COMPLEX PO) Take 1,500 mg by mouth 2 (two) times daily.     Historical Provider, MD  HYDROcodone-acetaminophen (NORCO/VICODIN) 5-325 MG per tablet Take 1-2 tablets by mouth every 6 (six) hours as needed. 10/01/13   Elwyn Lade, PA-C  metFORMIN (GLUCOPHAGE-XR) 500 MG 24 hr tablet Take 1,000 mg by mouth 2 (two) times daily.     Historical Provider, MD  Multiple Vitamins-Minerals (EYE VITAMINS PO) Take 1 tablet by mouth daily. SMOTHERS FORMULA    Historical Provider, MD  naproxen sodium (ANAPROX) 220 MG tablet Take 220 mg by mouth 2 (two) times daily with a meal.  Historical Provider, MD  Omega-3 Fatty Acids (FISH OIL) 1200 MG CAPS Take 1,200 mg by mouth 2 (two) times daily.     Historical Provider, MD  ondansetron (ZOFRAN) 4 MG tablet Take 1 tablet (4 mg total) by mouth every 6 (six) hours. 10/01/13   Elwyn Lade, PA-C  polyethylene glycol (MIRALAX / GLYCOLAX) packet Take 17 g by mouth daily.    Historical Provider, MD  rosuvastatin (CRESTOR) 10 MG tablet Take 10 mg by mouth daily.    Historical Provider, MD  sotalol (BETAPACE) 80 MG tablet Take 80 mg by mouth 2 (two) times daily.    Historical Provider, MD   SpO2 84% Physical Exam  Constitutional: He is oriented to person, place, and time. He appears well-developed and  well-nourished. No distress.  HENT:  Head: Normocephalic and atraumatic. Head is without raccoon's eyes and without Battle's sign.  Nose: Nose normal.  Eyes: Conjunctivae and EOM are normal. Pupils are equal, round, and reactive to light. No scleral icterus.  Neck: Spinous process tenderness and muscular tenderness present.  Cardiovascular: Normal rate, regular rhythm, normal heart sounds and intact distal pulses.   No murmur heard. Pulmonary/Chest: Effort normal and breath sounds normal. He has no rales. He exhibits no tenderness.    Abdominal: Soft. There is no tenderness. There is no rebound and no guarding.  Musculoskeletal: Normal range of motion. He exhibits no edema or tenderness.       Left hip: He exhibits deformity (Slightly shortened and externally rotated.). He exhibits normal range of motion (Pain with range of motion), normal strength (Distal strength and pulses intact) and no tenderness (No significant tenderness).       Thoracic back: He exhibits bony tenderness. He exhibits no tenderness.       Lumbar back: He exhibits bony tenderness.  No evidence of trauma to extremities, except as noted.  2+ distal pulses.    Neurological: He is alert and oriented to person, place, and time.  Skin: Skin is warm and dry. No rash noted.  Psychiatric: He has a normal mood and affect.  Nursing note and vitals reviewed.   ED Course  Procedures (including critical care time) Labs Review Labs Reviewed  CBC WITH DIFFERENTIAL - Abnormal; Notable for the following:    Hemoglobin 12.4 (*)    HCT 37.9 (*)    Neutrophils Relative % 82 (*)    Neutro Abs 7.9 (*)    All other components within normal limits  BASIC METABOLIC PANEL - Abnormal; Notable for the following:    Glucose, Bld 227 (*)    GFR calc non Af Amer 74 (*)    GFR calc Af Amer 86 (*)    All other components within normal limits  SURGICAL PCR SCREEN  TROPONIN I    Imaging Review Dg Thoracic Spine 2 View  05/30/2014    CLINICAL DATA:  Fall, mid back pain  EXAM: THORACIC SPINE - 2 VIEW  COMPARISON:  None.  FINDINGS: Normal thoracic kyphosis.  No evidence of fracture dislocation. Vertebral body heights are maintained.  Moderate multilevel degenerative changes.  Vascular calcifications.  Visualized lungs are clear.  IMPRESSION: No fracture or dislocation is seen.  Moderate multilevel degenerative changes.   Electronically Signed   By: Julian Hy M.D.   On: 05/30/2014 19:23   Dg Lumbar Spine Complete  05/30/2014   CLINICAL DATA:  Fall, mid/lower back pain  EXAM: LUMBAR SPINE - COMPLETE 4+ VIEW  COMPARISON:  CT abdomen dated 08/27/2013  FINDINGS: Five lumbar type vertebral bodies.  Normal lumbar lordosis.  No evidence of fracture or dislocation. Vertebral body heights are maintained.  Grade 1 anterolisthesis of L4 on L5.  Moderate multilevel degenerative changes.  Visualized bony pelvis appears intact.  Vascular calcifications with known infrarenal abdominal aortic aneurysm.a  IMPRESSION: No fracture or dislocation is seen.  Moderate degenerative changes.  Grade 1 anterolisthesis of L4 on L5.   Electronically Signed   By: Julian Hy M.D.   On: 05/30/2014 20:01   Dg Shoulder Right  05/30/2014   CLINICAL DATA:  Fall, right shoulder pain  EXAM: RIGHT SHOULDER - 2+ VIEW  COMPARISON:  None.  FINDINGS: No fracture or dislocation is seen.  Moderate degenerative changes of the glenohumeral joint and acromioclavicular joint  The visualized soft tissues are unremarkable.  Visualized right lung is clear.  IMPRESSION: No fracture or dislocation is seen.  Moderate degenerative changes.   Electronically Signed   By: Julian Hy M.D.   On: 05/30/2014 19:22   Dg Hip Complete Left  05/30/2014   CLINICAL DATA:  Left hip pain after falling at home today at 1500 hr.  EXAM: LEFT HIP - COMPLETE 2+ VIEW  COMPARISON:  Portable pelvis obtained earlier today.  FINDINGS: Left femoral neck fracture with mild valgus angulation.  Atheromatous arterial calcifications.  IMPRESSION: Previously noted mildly displaced left femoral neck fracture with mild valgus angulation.   Electronically Signed   By: Enrique Sack M.D.   On: 05/30/2014 19:21   Ct Head Wo Contrast  05/30/2014   CLINICAL DATA:  Right neck pain after falling today and hitting his shoulder against a door jamb.  EXAM: CT HEAD WITHOUT CONTRAST  CT CERVICAL SPINE WITHOUT CONTRAST  TECHNIQUE: Multidetector CT imaging of the head and cervical spine was performed following the standard protocol without intravenous contrast. Multiplanar CT image reconstructions of the cervical spine were also generated.  COMPARISON:  10/01/2013.  FINDINGS: CT HEAD FINDINGS  Diffusely enlarged ventricles and subarachnoid spaces. Patchy white matter low density in both cerebral hemispheres. No skull fracture, intracranial hemorrhage or paranasal sinus air-fluid levels. Dense arterial calcifications at the skull base.  CT CERVICAL SPINE FINDINGS  Extensive multilevel cervical spine degenerative changes. No prevertebral soft tissue swelling, fractures or subluxations. Bilateral vertebral and carotid artery atheromatous calcifications. Bullous changes at both lung apices.  IMPRESSION: 1. No skull fracture or intracranial hemorrhage. 2. No cervical spine fracture or subluxation. 3. Stable mid atrophy and chronic small vessel white matter ischemic changes. 4. Stable marked cervical spine degenerative changes. 5. COPD.   Electronically Signed   By: Enrique Sack M.D.   On: 05/30/2014 19:07   Ct Chest W Contrast  05/30/2014   CLINICAL DATA:  [place] today. Complaining of right shoulder and left hip pain.  EXAM: CT CHEST, ABDOMEN, AND PELVIS WITH CONTRAST  TECHNIQUE: Multidetector CT imaging of the chest, abdomen and pelvis was performed following the standard protocol during bolus administration of intravenous contrast.  CONTRAST:  175mL OMNIPAQUE IOHEXOL 300 MG/ML  SOLN  COMPARISON:  Chest CT, 08/30/2013.   FINDINGS: CT CHEST FINDINGS  Heart is mildly enlarged. There are dense coronary artery and mitral valve annular calcifications. Aorta is normal in caliber. There is partly calcified mild plaque along the thoracic aorta and at the origin of the left subclavian artery. No significant stenosis.  Right left pulmonary arteries are dilated to 3 cm, stable.  Borderline enlarged right peritracheal lymph node measures 1 cm short axis, stable. No mediastinal  masses. No hilar masses or adenopathy.  Lungs show heterogeneous coarse peripheral reticular opacities consistent with interstitial fibrosis, with a pattern suggesting UIP. Focal opacity noted in the left lower lobe on the prior exam measuring 15 mm x 7 mm, is without significant change. No acute lung consolidation or evidence of edema. No pleural effusion or pneumothorax.  CT ABDOMEN AND PELVIS FINDINGS  Liver shows morphologic changes consistent with cirrhosis. No liver mass or focal lesion. No contusion or laceration.  Mean is prominent measuring 12.4 cm in greatest dimension. No splenic mass or focal lesion.  Gallbladder is unremarkable. No bile duct dilation. No pancreatic masses or inflammation. No adrenal masses.  Mild bilateral renal cortical thinning. There is a peripherally calcified hypo attenuating, 3.1 cm mass protruding from the inferior margin of the left kidney, not imaged on the prior chest CT. This is likely a mildly complicated cyst. There is a low attenuation lesion in the upper pole left kidney, most likely a simple cyst. No other renal masses or lesions. No hydronephrosis. Normal ureters. Bladder is unremarkable.  There is an infrarenal abdominal aortic aneurysm measuring 3.7 cm in greatest anterior-posterior dimension. No evidence of rupture. Atherosclerotic changes noted along the abdominal aorta and its branch vessels.  No pathologically enlarged lymph nodes. No abnormal fluid collections. Specifically no evidence of hemo peritoneum.  No bowel  wall thickening to suggest a bowel hematoma. No bowel inflammatory changes. No mesenteric inflammation or contusion/hematoma.  MUSCULOSKELETAL:  There fractures of the right acetabulum. Fracture extends across the posterior column, extending across the medial acetabulum to the anterior,. Fracture is mildly distracted by 7 mm. There is mild fracture comminution with small fracture fragments noted between the major fracture components, at least 1 within the hip joint.  There is a fracture of the left femoral neck. This is subcapital, oblique and non comminuted. Fracture is displaced with the distal fracture component migrating superiorly by 17 mm. There is also mild varus angulation as well as significant apex anterior angulation.  No other pelvic or proximal femur fractures.  There are no other fractures.  IMPRESSION: 1. Fractures of the right acetabulum involving the anterior posterior columns. 2. Fracture of the left femoral neck, mildly displaced and angulated. 3. No other fractures. 4. No other acute findings in the chest, abdomen or pelvis. No other evidence of acute injury. 5. Chronic findings include cardiomegaly, coronary artery calcifications and changes consistent with interstitial fibrosis. Pulmonary artery dilation suggest pulmonary artery hypertension. Below the diaphragm, there changes of cirrhosis with borderline splenomegaly. Probable left renal cysts. 6. 3.7 cm infrarenal abdominal aortic aneurysm. No evidence of rupture.   Electronically Signed   By: Lajean Manes M.D.   On: 05/30/2014 22:06   Ct Cervical Spine Wo Contrast  05/30/2014   CLINICAL DATA:  Right neck pain after falling today and hitting his shoulder against a door jamb.  EXAM: CT HEAD WITHOUT CONTRAST  CT CERVICAL SPINE WITHOUT CONTRAST  TECHNIQUE: Multidetector CT imaging of the head and cervical spine was performed following the standard protocol without intravenous contrast. Multiplanar CT image reconstructions of the cervical  spine were also generated.  COMPARISON:  10/01/2013.  FINDINGS: CT HEAD FINDINGS  Diffusely enlarged ventricles and subarachnoid spaces. Patchy white matter low density in both cerebral hemispheres. No skull fracture, intracranial hemorrhage or paranasal sinus air-fluid levels. Dense arterial calcifications at the skull base.  CT CERVICAL SPINE FINDINGS  Extensive multilevel cervical spine degenerative changes. No prevertebral soft tissue swelling, fractures or subluxations. Bilateral  vertebral and carotid artery atheromatous calcifications. Bullous changes at both lung apices.  IMPRESSION: 1. No skull fracture or intracranial hemorrhage. 2. No cervical spine fracture or subluxation. 3. Stable mid atrophy and chronic small vessel white matter ischemic changes. 4. Stable marked cervical spine degenerative changes. 5. COPD.   Electronically Signed   By: Enrique Sack M.D.   On: 05/30/2014 19:07   Ct Abdomen Pelvis W Contrast  05/30/2014   CLINICAL DATA:  [place] today. Complaining of right shoulder and left hip pain.  EXAM: CT CHEST, ABDOMEN, AND PELVIS WITH CONTRAST  TECHNIQUE: Multidetector CT imaging of the chest, abdomen and pelvis was performed following the standard protocol during bolus administration of intravenous contrast.  CONTRAST:  161mL OMNIPAQUE IOHEXOL 300 MG/ML  SOLN  COMPARISON:  Chest CT, 08/30/2013.  FINDINGS: CT CHEST FINDINGS  Heart is mildly enlarged. There are dense coronary artery and mitral valve annular calcifications. Aorta is normal in caliber. There is partly calcified mild plaque along the thoracic aorta and at the origin of the left subclavian artery. No significant stenosis.  Right left pulmonary arteries are dilated to 3 cm, stable.  Borderline enlarged right peritracheal lymph node measures 1 cm short axis, stable. No mediastinal masses. No hilar masses or adenopathy.  Lungs show heterogeneous coarse peripheral reticular opacities consistent with interstitial fibrosis, with a pattern  suggesting UIP. Focal opacity noted in the left lower lobe on the prior exam measuring 15 mm x 7 mm, is without significant change. No acute lung consolidation or evidence of edema. No pleural effusion or pneumothorax.  CT ABDOMEN AND PELVIS FINDINGS  Liver shows morphologic changes consistent with cirrhosis. No liver mass or focal lesion. No contusion or laceration.  Mean is prominent measuring 12.4 cm in greatest dimension. No splenic mass or focal lesion.  Gallbladder is unremarkable. No bile duct dilation. No pancreatic masses or inflammation. No adrenal masses.  Mild bilateral renal cortical thinning. There is a peripherally calcified hypo attenuating, 3.1 cm mass protruding from the inferior margin of the left kidney, not imaged on the prior chest CT. This is likely a mildly complicated cyst. There is a low attenuation lesion in the upper pole left kidney, most likely a simple cyst. No other renal masses or lesions. No hydronephrosis. Normal ureters. Bladder is unremarkable.  There is an infrarenal abdominal aortic aneurysm measuring 3.7 cm in greatest anterior-posterior dimension. No evidence of rupture. Atherosclerotic changes noted along the abdominal aorta and its branch vessels.  No pathologically enlarged lymph nodes. No abnormal fluid collections. Specifically no evidence of hemo peritoneum.  No bowel wall thickening to suggest a bowel hematoma. No bowel inflammatory changes. No mesenteric inflammation or contusion/hematoma.  MUSCULOSKELETAL:  There fractures of the right acetabulum. Fracture extends across the posterior column, extending across the medial acetabulum to the anterior,. Fracture is mildly distracted by 7 mm. There is mild fracture comminution with small fracture fragments noted between the major fracture components, at least 1 within the hip joint.  There is a fracture of the left femoral neck. This is subcapital, oblique and non comminuted. Fracture is displaced with the distal fracture  component migrating superiorly by 17 mm. There is also mild varus angulation as well as significant apex anterior angulation.  No other pelvic or proximal femur fractures.  There are no other fractures.  IMPRESSION: 1. Fractures of the right acetabulum involving the anterior posterior columns. 2. Fracture of the left femoral neck, mildly displaced and angulated. 3. No other fractures. 4. No other  acute findings in the chest, abdomen or pelvis. No other evidence of acute injury. 5. Chronic findings include cardiomegaly, coronary artery calcifications and changes consistent with interstitial fibrosis. Pulmonary artery dilation suggest pulmonary artery hypertension. Below the diaphragm, there changes of cirrhosis with borderline splenomegaly. Probable left renal cysts. 6. 3.7 cm infrarenal abdominal aortic aneurysm. No evidence of rupture.   Electronically Signed   By: Lajean Manes M.D.   On: 05/30/2014 22:06   Dg Pelvis Portable  05/30/2014   CLINICAL DATA:  [place] today at 3 o'clock. Tripped over a door way. Left hip pain.  EXAM: PORTABLE PELVIS 1-2 VIEWS PORTABLE ONE-VIEW CHEST X-RAY.  COMPARISON:  HIP RADIOGRAPHS 10/01/2013 AND CHEST X-RAY 08/26/2013.  FINDINGS: CHEST X-RAY:  The heart is mildly enlarged but stable. There is tortuosity and calcification of the thoracic aorta. Chronic lung changes with pulmonary fibrosis. No definite acute overlying pulmonary process. The bony thorax is grossly intact.  Pelvis:  There is a displaced left femoral neck fracture. The right hip is intact. Moderate degenerative changes bilaterally, right greater than left. The pubic symphysis and SI joints are intact. No pelvic fractures. Extensive vascular calcifications are noted.  IMPRESSION: 1. Displaced left femoral neck fracture. 2. No acute cardiopulmonary findings. Chronic lung changes/pulmonary fibrosis.   Electronically Signed   By: Kalman Jewels M.D.   On: 05/30/2014 17:36   Dg Chest Port 1 View  05/30/2014   CLINICAL  DATA:  80 year old male who fell at 1500 hr today tripping over door way. Acute pain. Initial encounter.  EXAM: PORTABLE CHEST - 1 VIEW  COMPARISON:  Chest CT 08/30/2013 and earlier.  FINDINGS: Portable AP semi upright view at 1716 hr. Lower lung volumes. Stable cardiomegaly and mediastinal contours. Peripheral increased opacity greater in the left lung is stable. No pneumothorax, pulmonary edema, or pleural effusion identified. No acute pulmonary opacity identified. Calcified atherosclerosis of the aorta. Visualized tracheal air column is within normal limits. No acute osseous abnormality identified.  IMPRESSION: Cardiomegaly and chronic lung disease. No superimposed acute findings are identified.   Electronically Signed   By: Lars Pinks M.D.   On: 05/30/2014 17:35  All radiology studies independently viewed by me.      EKG Interpretation   Date/Time:  Monday May 30 2014 16:50:10 EST Ventricular Rate:  83 PR Interval:  237 QRS Duration: 180 QT Interval:  458 QTC Calculation: 538 R Axis:   -78 Text Interpretation:  Sinus rhythm Ventricular premature complex Prolonged  PR interval RBBB and LAFB No old tracing to compare Confirmed by [facility]   MD, TREY ([id number]) on 05/30/2014 5:24:14 PM      MDM   Final diagnoses:  Fall  Closed left hip fracture, initial encounter  Right acetabular fracture, closed, initial encounter  Hypoxia    80 year old male presenting after a fall. Described as mechanical in nature. He complains of right chest wall and right shoulder pain. His exam is also notable for neck tenderness, hypoxia, and left hip deformity.    8:47 PM CT of head and neck negative for acute injury.  Plain films show left hip fracture.  (Discussed case with Dr. Mardelle Matte.)  On reassessment, pt still complains of severe right shoulder pain.  He is breathing comfortably on O2, but he is not on O2 at home. He also now has abdominal tenderness to palpation on left.  No r/r/g. Given this  constellation of symptoms/signs, will obtain CT chest and A/P.    11:37 PM CT C/A/P demonstrated right acetabular  fracture.  This was communicated to Dr. Mardelle Matte. It showed no other acute pathology.  I consulted Hospital Medicine (Dr. Hal Hope) who will admit to stepdown unit.     Artis Delay, MD 05/30/14 [phone]

## 2014-05-30 NOTE — ED Notes (Signed)
Consent obtained

## 2014-05-31 ENCOUNTER — Inpatient Hospital Stay (HOSPITAL_COMMUNITY): Payer: Medicare Other

## 2014-05-31 ENCOUNTER — Encounter (HOSPITAL_COMMUNITY)
Admission: EM | Disposition: A | Discharge status: Skilled Nursing Facility | Payer: Self-pay | Source: Home / Self Care | Attending: Internal Medicine

## 2014-05-31 ENCOUNTER — Encounter (HOSPITAL_COMMUNITY): Payer: Self-pay | Admitting: Internal Medicine

## 2014-05-31 DIAGNOSIS — M810 Age-related osteoporosis without current pathological fracture: Secondary | ICD-10-CM | POA: Diagnosis present

## 2014-05-31 DIAGNOSIS — I359 Nonrheumatic aortic valve disorder, unspecified: Secondary | ICD-10-CM

## 2014-05-31 DIAGNOSIS — J849 Interstitial pulmonary disease, unspecified: Secondary | ICD-10-CM | POA: Diagnosis present

## 2014-05-31 DIAGNOSIS — W1839XA Other fall on same level, initial encounter: Secondary | ICD-10-CM | POA: Diagnosis present

## 2014-05-31 DIAGNOSIS — H3532 Exudative age-related macular degeneration: Secondary | ICD-10-CM | POA: Diagnosis present

## 2014-05-31 DIAGNOSIS — J441 Chronic obstructive pulmonary disease with (acute) exacerbation: Secondary | ICD-10-CM | POA: Diagnosis present

## 2014-05-31 DIAGNOSIS — E872 Acidosis: Secondary | ICD-10-CM | POA: Diagnosis present

## 2014-05-31 DIAGNOSIS — R0902 Hypoxemia: Secondary | ICD-10-CM | POA: Insufficient documentation

## 2014-05-31 DIAGNOSIS — E785 Hyperlipidemia, unspecified: Secondary | ICD-10-CM | POA: Diagnosis present

## 2014-05-31 DIAGNOSIS — I248 Other forms of acute ischemic heart disease: Secondary | ICD-10-CM | POA: Diagnosis present

## 2014-05-31 DIAGNOSIS — Z66 Do not resuscitate: Secondary | ICD-10-CM | POA: Diagnosis present

## 2014-05-31 DIAGNOSIS — T50995A Adverse effect of other drugs, medicaments and biological substances, initial encounter: Secondary | ICD-10-CM | POA: Diagnosis not present

## 2014-05-31 DIAGNOSIS — J9601 Acute respiratory failure with hypoxia: Secondary | ICD-10-CM | POA: Diagnosis present

## 2014-05-31 DIAGNOSIS — D649 Anemia, unspecified: Secondary | ICD-10-CM | POA: Diagnosis present

## 2014-05-31 DIAGNOSIS — F17210 Nicotine dependence, cigarettes, uncomplicated: Secondary | ICD-10-CM | POA: Diagnosis present

## 2014-05-31 DIAGNOSIS — K5900 Constipation, unspecified: Secondary | ICD-10-CM | POA: Diagnosis not present

## 2014-05-31 DIAGNOSIS — I10 Essential (primary) hypertension: Secondary | ICD-10-CM | POA: Diagnosis present

## 2014-05-31 DIAGNOSIS — E780 Pure hypercholesterolemia: Secondary | ICD-10-CM | POA: Diagnosis present

## 2014-05-31 DIAGNOSIS — N400 Enlarged prostate without lower urinary tract symptoms: Secondary | ICD-10-CM | POA: Diagnosis present

## 2014-05-31 DIAGNOSIS — Y9389 Activity, other specified: Secondary | ICD-10-CM | POA: Diagnosis not present

## 2014-05-31 DIAGNOSIS — Z8679 Personal history of other diseases of the circulatory system: Secondary | ICD-10-CM

## 2014-05-31 DIAGNOSIS — S32401A Unspecified fracture of right acetabulum, initial encounter for closed fracture: Secondary | ICD-10-CM | POA: Diagnosis present

## 2014-05-31 DIAGNOSIS — G8929 Other chronic pain: Secondary | ICD-10-CM | POA: Diagnosis present

## 2014-05-31 DIAGNOSIS — S72002A Fracture of unspecified part of neck of left femur, initial encounter for closed fracture: Secondary | ICD-10-CM | POA: Diagnosis present

## 2014-05-31 DIAGNOSIS — Y92009 Unspecified place in unspecified non-institutional (private) residence as the place of occurrence of the external cause: Secondary | ICD-10-CM | POA: Diagnosis not present

## 2014-05-31 DIAGNOSIS — J8410 Pulmonary fibrosis, unspecified: Secondary | ICD-10-CM | POA: Diagnosis present

## 2014-05-31 DIAGNOSIS — I4891 Unspecified atrial fibrillation: Secondary | ICD-10-CM | POA: Diagnosis present

## 2014-05-31 DIAGNOSIS — Z01811 Encounter for preprocedural respiratory examination: Secondary | ICD-10-CM | POA: Diagnosis not present

## 2014-05-31 DIAGNOSIS — N390 Urinary tract infection, site not specified: Secondary | ICD-10-CM | POA: Diagnosis not present

## 2014-05-31 DIAGNOSIS — Z79899 Other long term (current) drug therapy: Secondary | ICD-10-CM | POA: Diagnosis not present

## 2014-05-31 DIAGNOSIS — S72009A Fracture of unspecified part of neck of unspecified femur, initial encounter for closed fracture: Secondary | ICD-10-CM | POA: Diagnosis present

## 2014-05-31 DIAGNOSIS — E1142 Type 2 diabetes mellitus with diabetic polyneuropathy: Secondary | ICD-10-CM | POA: Diagnosis present

## 2014-05-31 DIAGNOSIS — Z825 Family history of asthma and other chronic lower respiratory diseases: Secondary | ICD-10-CM | POA: Diagnosis not present

## 2014-05-31 DIAGNOSIS — E119 Type 2 diabetes mellitus without complications: Secondary | ICD-10-CM

## 2014-05-31 DIAGNOSIS — M25511 Pain in right shoulder: Secondary | ICD-10-CM | POA: Diagnosis present

## 2014-05-31 DIAGNOSIS — E559 Vitamin D deficiency, unspecified: Secondary | ICD-10-CM | POA: Diagnosis present

## 2014-05-31 DIAGNOSIS — G92 Toxic encephalopathy: Secondary | ICD-10-CM | POA: Diagnosis not present

## 2014-05-31 LAB — GLUCOSE, CAPILLARY
GLUCOSE-CAPILLARY: 177 mg/dL — AB (ref 70–99)
GLUCOSE-CAPILLARY: 183 mg/dL — AB (ref 70–99)
GLUCOSE-CAPILLARY: 174 mg/dL — AB (ref 70–99)
Glucose-Capillary: 190 mg/dL — ABNORMAL HIGH (ref 70–99)
Glucose-Capillary: 174 mg/dL — ABNORMAL HIGH (ref 70–99)

## 2014-05-31 LAB — CBC WITH DIFFERENTIAL/PLATELET
BASOS ABS: 0 10*3/uL (ref 0.0–0.1)
Basophils Relative: 0 % (ref 0–1)
Eosinophils Absolute: 0.1 10*3/uL (ref 0.0–0.7)
Eosinophils Relative: 1 % (ref 0–5)
HEMATOCRIT: 37.4 % — AB (ref 39.0–52.0)
Hemoglobin: 12.1 g/dL — ABNORMAL LOW (ref 13.0–17.0)
Lymphocytes Relative: 8 % — ABNORMAL LOW (ref 12–46)
Lymphs Abs: 0.8 10*3/uL (ref 0.7–4.0)
MCH: 28.9 pg (ref 26.0–34.0)
MCHC: 32.4 g/dL (ref 30.0–36.0)
MCV: 89.5 fL (ref 78.0–100.0)
MONO ABS: 0.5 10*3/uL (ref 0.1–1.0)
Monocytes Relative: 5 % (ref 3–12)
NEUTROS ABS: 8.5 10*3/uL — AB (ref 1.7–7.7)
Neutrophils Relative %: 86 % — ABNORMAL HIGH (ref 43–77)
PLATELETS: 151 10*3/uL (ref 150–400)
RBC: 4.18 MIL/uL — ABNORMAL LOW (ref 4.22–5.81)
RDW: 14.1 % (ref 11.5–15.5)
WBC: 9.9 10*3/uL (ref 4.0–10.5)

## 2014-05-31 LAB — URINALYSIS, ROUTINE W REFLEX MICROSCOPIC
Bilirubin Urine: NEGATIVE
GLUCOSE, UA: NEGATIVE mg/dL
Glucose, UA: NEGATIVE mg/dL
Hgb urine dipstick: NEGATIVE
KETONES UR: 15 mg/dL — AB
KETONES UR: NEGATIVE mg/dL
Leukocytes, UA: NEGATIVE
Leukocytes, UA: NEGATIVE
NITRITE: NEGATIVE
Nitrite: NEGATIVE
PH: 5 (ref 5.0–8.0)
Protein, ur: 30 mg/dL — AB
Protein, ur: 30 mg/dL — AB
SPECIFIC GRAVITY, URINE: 1.043 — AB (ref 1.005–1.030)
Specific Gravity, Urine: 1.03 (ref 1.005–1.030)
UROBILINOGEN UA: 1 mg/dL (ref 0.0–1.0)
Urobilinogen, UA: 0.2 mg/dL (ref 0.0–1.0)
pH: 5 (ref 5.0–8.0)

## 2014-05-31 LAB — TYPE AND SCREEN
ABO/RH(D): A POS
Antibody Screen: NEGATIVE

## 2014-05-31 LAB — BASIC METABOLIC PANEL
Anion gap: 12 (ref 5–15)
BUN: 25 mg/dL — ABNORMAL HIGH (ref 6–23)
CALCIUM: 8.7 mg/dL (ref 8.4–10.5)
CO2: 24 mEq/L (ref 19–32)
CREATININE: 1.3 mg/dL (ref 0.50–1.35)
Chloride: 103 mEq/L (ref 96–112)
GFR calc Af Amer: 56 mL/min — ABNORMAL LOW (ref >90)
GFR calc non Af Amer: 48 mL/min — ABNORMAL LOW (ref >90)
GLUCOSE: 186 mg/dL — AB (ref 70–99)
Potassium: 5.1 mEq/L (ref 3.7–5.3)
Sodium: 139 mEq/L (ref 137–147)

## 2014-05-31 LAB — PRO B NATRIURETIC PEPTIDE: Pro B Natriuretic peptide (BNP): 7931 pg/mL — ABNORMAL HIGH (ref 0–450)

## 2014-05-31 LAB — BLOOD GAS, ARTERIAL
Acid-base deficit: 2.1 mmol/L — ABNORMAL HIGH (ref 0.0–2.0)
BICARBONATE: 23.3 meq/L (ref 20.0–24.0)
Drawn by: 418751
O2 Content: 5 L/min
O2 Saturation: 96.2 %
PH ART: 7.306 — AB (ref 7.350–7.450)
Patient temperature: 98.6
TCO2: 24.8 mmol/L (ref 0–100)
pCO2 arterial: 48.3 mmHg — ABNORMAL HIGH (ref 35.0–45.0)
pO2, Arterial: 91.8 mmHg (ref 80.0–100.0)

## 2014-05-31 LAB — TROPONIN I
TROPONIN I: 0.75 ng/mL — AB (ref <0.30)
Troponin I: 0.87 ng/mL (ref <0.30)
Troponin I: 0.51 ng/mL (ref <0.30)
Troponin I: 0.52 ng/mL (ref <0.30)

## 2014-05-31 LAB — COMPREHENSIVE METABOLIC PANEL
ALT: 12 U/L (ref 0–53)
ANION GAP: 10 (ref 5–15)
AST: 17 U/L (ref 0–37)
Albumin: 3.3 g/dL — ABNORMAL LOW (ref 3.5–5.2)
Alkaline Phosphatase: 94 U/L (ref 39–117)
BILIRUBIN TOTAL: 0.4 mg/dL (ref 0.3–1.2)
BUN: 19 mg/dL (ref 6–23)
CHLORIDE: 100 meq/L (ref 96–112)
CO2: 27 meq/L (ref 19–32)
Calcium: 8.8 mg/dL (ref 8.4–10.5)
Creatinine, Ser: 1.08 mg/dL (ref 0.50–1.35)
GFR calc Af Amer: 70 mL/min — ABNORMAL LOW (ref >90)
GFR, EST NON AFRICAN AMERICAN: 61 mL/min — AB (ref >90)
Glucose, Bld: 221 mg/dL — ABNORMAL HIGH (ref 70–99)
POTASSIUM: 5.1 meq/L (ref 3.7–5.3)
Sodium: 137 mEq/L (ref 137–147)
Total Protein: 6.9 g/dL (ref 6.0–8.3)

## 2014-05-31 LAB — CBC
HCT: 35.5 % — ABNORMAL LOW (ref 39.0–52.0)
HEMOGLOBIN: 11.6 g/dL — AB (ref 13.0–17.0)
MCH: 29.4 pg (ref 26.0–34.0)
MCHC: 32.7 g/dL (ref 30.0–36.0)
MCV: 90.1 fL (ref 78.0–100.0)
PLATELETS: 125 10*3/uL — AB (ref 150–400)
RBC: 3.94 MIL/uL — AB (ref 4.22–5.81)
RDW: 14.2 % (ref 11.5–15.5)
WBC: 9 10*3/uL (ref 4.0–10.5)

## 2014-05-31 LAB — URINE MICROSCOPIC-ADD ON

## 2014-05-31 LAB — VITAMIN D 25 HYDROXY (VIT D DEFICIENCY, FRACTURES): Vit D, 25-Hydroxy: 24 ng/mL — ABNORMAL LOW (ref 30–89)

## 2014-05-31 LAB — SURGICAL PCR SCREEN
MRSA, PCR: NEGATIVE
Staphylococcus aureus: POSITIVE — AB

## 2014-05-31 LAB — PROTIME-INR
INR: 1.06 (ref 0.00–1.49)
Prothrombin Time: 14 seconds (ref 11.6–15.2)

## 2014-05-31 SURGERY — HEMIARTHROPLASTY, HIP, DIRECT ANTERIOR APPROACH, FOR FRACTURE
Anesthesia: Choice | Laterality: Left

## 2014-05-31 MED ORDER — FENTANYL CITRATE 0.05 MG/ML IJ SOLN
INTRAMUSCULAR | Status: AC
  Filled 2014-05-31: qty 5

## 2014-05-31 MED ORDER — SUCCINYLCHOLINE CHLORIDE 20 MG/ML IJ SOLN
INTRAMUSCULAR | Status: AC
  Filled 2014-05-31: qty 1

## 2014-05-31 MED ORDER — BIOTENE DRY MOUTH MT LIQD: 15.0000 mL | OROMUCOSAL | Status: DC | PRN

## 2014-05-31 MED ORDER — MORPHINE SULFATE 2 MG/ML IJ SOLN
0.5000 mg | INTRAMUSCULAR | Status: DC | PRN
Start: 2014-05-31 — End: 2014-05-31
  Administered 2014-05-31 (×5): 0.5 mg via INTRAVENOUS
  Filled 2014-05-31 (×5): qty 1

## 2014-05-31 MED ORDER — ENSURE COMPLETE PO LIQD: 237.0000 mL | Freq: Two times a day (BID) | ORAL | Status: DC | PRN

## 2014-05-31 MED ORDER — MORPHINE SULFATE 2 MG/ML IJ SOLN
2.0000 mg | INTRAMUSCULAR | Status: DC | PRN
  Administered 2014-05-31: 2 mg via INTRAVENOUS
  Filled 2014-05-31: qty 1

## 2014-05-31 MED ORDER — ONDANSETRON HCL 4 MG/2ML IJ SOLN
4.0000 mg | Freq: Four times a day (QID) | INTRAMUSCULAR | Status: DC | PRN
  Administered 2014-06-02: 4 mg via INTRAVENOUS
  Filled 2014-05-31: qty 2

## 2014-05-31 MED ORDER — ONDANSETRON HCL 4 MG/2ML IJ SOLN
INTRAMUSCULAR | Status: AC
  Filled 2014-05-31: qty 2

## 2014-05-31 MED ORDER — PROPOFOL 10 MG/ML IV BOLUS
INTRAVENOUS | Status: AC
  Filled 2014-05-31: qty 20

## 2014-05-31 MED ORDER — FENTANYL CITRATE 0.05 MG/ML IJ SOLN
50.0000 ug | Freq: Once | INTRAMUSCULAR | Status: AC
  Administered 2014-05-31: 50 ug via INTRAVENOUS
  Filled 2014-05-31: qty 2

## 2014-05-31 MED ORDER — EPHEDRINE SULFATE 50 MG/ML IJ SOLN
INTRAMUSCULAR | Status: AC
  Filled 2014-05-31: qty 1

## 2014-05-31 MED ORDER — SODIUM CHLORIDE 0.9 % IV SOLN
Freq: Once | INTRAVENOUS | Status: AC
  Administered 2014-05-31: 19:00:00 via INTRAVENOUS

## 2014-05-31 MED ORDER — ASPIRIN EC 81 MG PO TBEC
81.0000 mg | DELAYED_RELEASE_TABLET | Freq: Every day | ORAL | Status: DC
  Administered 2014-06-02 – 2014-06-06 (×5): 81 mg via ORAL
  Filled 2014-05-31 (×6): qty 1

## 2014-05-31 MED ORDER — ARTIFICIAL TEARS OP OINT
TOPICAL_OINTMENT | OPHTHALMIC | Status: AC
  Filled 2014-05-31: qty 3.5

## 2014-05-31 MED ORDER — ROCURONIUM BROMIDE 50 MG/5ML IV SOLN
INTRAVENOUS | Status: AC
  Filled 2014-05-31: qty 1

## 2014-05-31 MED ORDER — OMEGA-3-ACID ETHYL ESTERS 1 G PO CAPS
1000.0000 mg | ORAL_CAPSULE | Freq: Two times a day (BID) | ORAL | Status: DC
  Administered 2014-05-31 – 2014-06-06 (×12): 1000 mg via ORAL
  Filled 2014-05-31 (×15): qty 1

## 2014-05-31 MED ORDER — ONDANSETRON HCL 4 MG/2ML IJ SOLN: 4.0000 mg | Freq: Three times a day (TID) | INTRAMUSCULAR | Status: DC | PRN

## 2014-05-31 MED ORDER — FINASTERIDE 5 MG PO TABS
5.0000 mg | ORAL_TABLET | Freq: Every day | ORAL | Status: DC
Start: 2014-05-31 — End: 2014-06-06
  Administered 2014-05-31 – 2014-06-06 (×6): 5 mg via ORAL
  Filled 2014-05-31 (×7): qty 1

## 2014-05-31 MED ORDER — GLIPIZIDE ER 10 MG PO TB24
20.0000 mg | ORAL_TABLET | Freq: Every day | ORAL | Status: DC
  Filled 2014-05-31 (×2): qty 2

## 2014-05-31 MED ORDER — HYDROCODONE-ACETAMINOPHEN 5-325 MG PO TABS
1.0000 | ORAL_TABLET | Freq: Four times a day (QID) | ORAL | Status: DC | PRN
  Administered 2014-05-31 (×2): 2 via ORAL
  Administered 2014-05-31: 1 via ORAL
  Filled 2014-05-31 (×2): qty 2
  Filled 2014-05-31: qty 1

## 2014-05-31 MED ORDER — DEXTROSE 5 % IV SOLN
1.0000 g | INTRAVENOUS | Status: DC
  Administered 2014-05-31 – 2014-06-01 (×2): 1 g via INTRAVENOUS
  Filled 2014-05-31 (×3): qty 10

## 2014-05-31 MED ORDER — SODIUM CHLORIDE 0.9 % IV SOLN
INTRAVENOUS | Status: DC
  Administered 2014-05-31 (×3): via INTRAVENOUS

## 2014-05-31 MED ORDER — NEOSTIGMINE METHYLSULFATE 10 MG/10ML IV SOLN
INTRAVENOUS | Status: AC
  Filled 2014-05-31: qty 1

## 2014-05-31 MED ORDER — SODIUM CHLORIDE 0.9 % IJ SOLN
INTRAMUSCULAR | Status: AC
  Filled 2014-05-31: qty 10

## 2014-05-31 MED ORDER — INSULIN ASPART 100 UNIT/ML ~~LOC~~ SOLN
0.0000 [IU] | Freq: Three times a day (TID) | SUBCUTANEOUS | Status: DC
  Administered 2014-05-31 – 2014-06-03 (×7): 2 [IU] via SUBCUTANEOUS
  Administered 2014-06-03: 1 [IU] via SUBCUTANEOUS
  Administered 2014-06-03 – 2014-06-04 (×4): 2 [IU] via SUBCUTANEOUS
  Administered 2014-06-05: 1 [IU] via SUBCUTANEOUS
  Administered 2014-06-05: 2 [IU] via SUBCUTANEOUS
  Administered 2014-06-05 – 2014-06-06 (×2): 5 [IU] via SUBCUTANEOUS
  Administered 2014-06-06: 3 [IU] via SUBCUTANEOUS

## 2014-05-31 MED ORDER — SOTALOL HCL 80 MG PO TABS
80.0000 mg | ORAL_TABLET | Freq: Two times a day (BID) | ORAL | Status: DC
  Administered 2014-05-31 – 2014-06-06 (×13): 80 mg via ORAL
  Filled 2014-05-31 (×15): qty 1

## 2014-05-31 MED ORDER — ONDANSETRON HCL 4 MG/2ML IJ SOLN
INTRAMUSCULAR | Status: AC
  Administered 2014-05-31: 4 mg
  Filled 2014-05-31: qty 2

## 2014-05-31 MED ORDER — GLYCOPYRROLATE 0.2 MG/ML IJ SOLN
INTRAMUSCULAR | Status: AC
  Filled 2014-05-31: qty 2

## 2014-05-31 MED ORDER — ROSUVASTATIN CALCIUM 10 MG PO TABS
10.0000 mg | ORAL_TABLET | Freq: Every day | ORAL | Status: DC
Start: 2014-05-31 — End: 2014-06-06
  Administered 2014-05-31 – 2014-06-06 (×7): 10 mg via ORAL
  Filled 2014-05-31 (×7): qty 1

## 2014-05-31 MED ORDER — DOCUSATE SODIUM 100 MG PO CAPS
100.0000 mg | ORAL_CAPSULE | Freq: Two times a day (BID) | ORAL | Status: DC | PRN
  Filled 2014-05-31: qty 1

## 2014-05-31 MED ORDER — GLIPIZIDE ER 10 MG PO TB24
20.0000 mg | ORAL_TABLET | Freq: Every day | ORAL | Status: DC
  Filled 2014-05-31: qty 2

## 2014-05-31 MED ORDER — ONDANSETRON HCL 4 MG/2ML IJ SOLN: 4.0000 mg | Freq: Four times a day (QID) | INTRAMUSCULAR | Status: DC

## 2014-05-31 MED ORDER — FENTANYL CITRATE 0.05 MG/ML IJ SOLN: 50.0000 ug | INTRAMUSCULAR | Status: DC | PRN

## 2014-05-31 MED ORDER — ASPIRIN 325 MG PO TABS
325.0000 mg | ORAL_TABLET | Freq: Every day | ORAL | Status: DC
  Administered 2014-05-31: 325 mg via ORAL
  Filled 2014-05-31: qty 1

## 2014-05-31 MED ORDER — FUROSEMIDE 10 MG/ML IJ SOLN
20.0000 mg | Freq: Once | INTRAMUSCULAR | Status: AC
  Administered 2014-05-31: 20 mg via INTRAVENOUS
  Filled 2014-05-31: qty 2

## 2014-05-31 MED ORDER — SODIUM CHLORIDE 0.9 % IV BOLUS (SEPSIS)
250.0000 mL | Freq: Once | INTRAVENOUS | Status: AC
  Administered 2014-05-31: 250 mL via INTRAVENOUS

## 2014-05-31 MED ORDER — HYDROCODONE-ACETAMINOPHEN 5-325 MG PO TABS: 1.0000 | ORAL_TABLET | ORAL | Status: DC | PRN

## 2014-05-31 MED ORDER — PHENYLEPHRINE 40 MCG/ML (10ML) SYRINGE FOR IV PUSH (FOR BLOOD PRESSURE SUPPORT)
PREFILLED_SYRINGE | INTRAVENOUS | Status: AC
  Filled 2014-05-31: qty 10

## 2014-05-31 SURGICAL SUPPLY — 57 items
BENZOIN TINCTURE PRP APPL 2/3 (GAUZE/BANDAGES/DRESSINGS) ×3 IMPLANT
BLADE SAW SGTL 18.5X63.X.64 HD (BLADE) ×3 IMPLANT
BRUSH FEMORAL CANAL (MISCELLANEOUS) IMPLANT
CLOSURE STERI-STRIP 1/2X4 (GAUZE/BANDAGES/DRESSINGS) ×1
CLSR STERI-STRIP ANTIMIC 1/2X4 (GAUZE/BANDAGES/DRESSINGS) ×2 IMPLANT
COVER BACK TABLE 24X17X13 BIG (DRAPES) IMPLANT
COVER SURGICAL LIGHT HANDLE (MISCELLANEOUS) ×3 IMPLANT
DRAPE IMP U-DRAPE 54X76 (DRAPES) ×3 IMPLANT
DRAPE INCISE IOBAN 66X45 STRL (DRAPES) IMPLANT
DRAPE ORTHO SPLIT 77X108 STRL (DRAPES) ×4
DRAPE SURG ORHT 6 SPLT 77X108 (DRAPES) ×2 IMPLANT
DRAPE U-SHAPE 47X51 STRL (DRAPES) ×3 IMPLANT
DRILL BIT 5/64 (BIT) ×3 IMPLANT
DRSG MEPILEX BORDER 4X12 (GAUZE/BANDAGES/DRESSINGS) IMPLANT
DRSG MEPILEX BORDER 4X8 (GAUZE/BANDAGES/DRESSINGS) IMPLANT
DURAPREP 26ML APPLICATOR (WOUND CARE) ×3 IMPLANT
ELECT BLADE 6.5 EXT (BLADE) IMPLANT
ELECT CAUTERY BLADE 6.4 (BLADE) ×3 IMPLANT
ELECT REM PT RETURN 9FT ADLT (ELECTROSURGICAL) ×3
ELECTRODE REM PT RTRN 9FT ADLT (ELECTROSURGICAL) ×1 IMPLANT
FACESHIELD WRAPAROUND (MASK) ×6 IMPLANT
GLOVE BIOGEL PI ORTHO PRO SZ8 (GLOVE) ×2
GLOVE ORTHO TXT STRL SZ7.5 (GLOVE) ×3 IMPLANT
GLOVE PI ORTHO PRO STRL SZ8 (GLOVE) ×1 IMPLANT
GLOVE SURG ORTHO 8.0 STRL STRW (GLOVE) ×6 IMPLANT
GOWN STRL REUS W/ TWL XL LVL3 (GOWN DISPOSABLE) ×1 IMPLANT
GOWN STRL REUS W/TWL 2XL LVL3 (GOWN DISPOSABLE) ×3 IMPLANT
GOWN STRL REUS W/TWL XL LVL3 (GOWN DISPOSABLE) ×2
HANDPIECE INTERPULSE COAX TIP (DISPOSABLE)
KIT BASIN OR (CUSTOM PROCEDURE TRAY) ×3 IMPLANT
KIT ROOM TURNOVER OR (KITS) ×3 IMPLANT
MANIFOLD NEPTUNE II (INSTRUMENTS) ×3 IMPLANT
NDL SUT 6 .5 CRC .975X.05 MAYO (NEEDLE) IMPLANT
NEEDLE 22X1 1/2 (OR ONLY) (NEEDLE) ×3 IMPLANT
NEEDLE MAYO TAPER (NEEDLE)
NS IRRIG 1000ML POUR BTL (IV SOLUTION) ×3 IMPLANT
PACK TOTAL JOINT (CUSTOM PROCEDURE TRAY) ×3 IMPLANT
PACK UNIVERSAL I (CUSTOM PROCEDURE TRAY) ×3 IMPLANT
PAD ARMBOARD 7.5X6 YLW CONV (MISCELLANEOUS) ×6 IMPLANT
PILLOW ABDUCTION HIP (SOFTGOODS) ×3 IMPLANT
PRESSURIZER FEMORAL UNIV (MISCELLANEOUS) IMPLANT
RETRIEVER SUT HEWSON (MISCELLANEOUS) ×3 IMPLANT
SET HNDPC FAN SPRY TIP SCT (DISPOSABLE) IMPLANT
SUT FIBERWIRE #2 38 REV NDL BL (SUTURE) ×6
SUT MNCRL AB 4-0 PS2 18 (SUTURE) ×3 IMPLANT
SUT VIC AB 0 CT1 27 (SUTURE) ×2
SUT VIC AB 0 CT1 27XBRD ANBCTR (SUTURE) ×1 IMPLANT
SUT VIC AB 1 CT1 27 (SUTURE) ×4
SUT VIC AB 1 CT1 27XBRD ANBCTR (SUTURE) ×2 IMPLANT
SUT VIC AB 3-0 SH 8-18 (SUTURE) ×3 IMPLANT
SUTURE FIBERWR#2 38 REV NDL BL (SUTURE) ×2 IMPLANT
SYR CONTROL 10ML LL (SYRINGE) ×3 IMPLANT
TOWEL OR 17X24 6PK STRL BLUE (TOWEL DISPOSABLE) ×3 IMPLANT
TOWEL OR 17X26 10 PK STRL BLUE (TOWEL DISPOSABLE) ×3 IMPLANT
TOWER CARTRIDGE SMART MIX (DISPOSABLE) IMPLANT
TRAY FOLEY CATH 16FRSI W/METER (SET/KITS/TRAYS/PACK) ×3 IMPLANT
WATER STERILE IRR 1000ML POUR (IV SOLUTION) ×12 IMPLANT

## 2014-05-31 NOTE — Progress Notes (Addendum)
INITIAL NUTRITION ASSESSMENT  DOCUMENTATION CODES Per approved criteria  -Not Applicable   INTERVENTION: Ensure Complete po BID PRN, each supplement provides 350 kcal and 13 grams of protein RD to follow for nutrition care plan  NUTRITION DIAGNOSIS: Increased nutrient needs related to hip fracture as evidenced by estimated nutrition needs  Goal: Pt to meet >/= 90% of their estimated nutrition needs   Monitor:  PO & supplemental intake, weight, labs, I/O's  Reason for Assessment: Consult  80 y.o. male  Admitting Dx: Hip fracture, left  ASSESSMENT: 80 y.o. male with history of atrial fibrillation status post ablation, DM, HTN and hyperlipidemia had a fall at his house. In ER workup revealed left hip fracture and also right acetabular fracture.   RD unable to obtain nutrition hx.  Pt currently in CT IMAGING.  Consulted via Hip Fracture Protocol.  Noted swallow evaluation ordered and pending.  Pt with increased nutrition needs given acute hospitalization and future surgical intervention (anticipated Thursday AM).  No % PO intake available per flowsheet records.  Would benefit from addition of oral nutrition supplements as needed.  RD to order.  Per wt readings below, pt has had a 13% weight loss since March 2015; not significant for time frame.  RD unable to complete Nutrition Focused Physical Exam at this time.  Height: Ht Readings from Last 1 Encounters:  05/30/14 6\' 1"  (1.854 m)    Weight: Wt Readings from Last 1 Encounters:  05/30/14 182 lb (82.555 kg)    Ideal Body Weight: 184 lb  % Ideal Body Weight: 98%  Wt Readings from Last 10 Encounters:  05/30/14 182 lb (82.555 kg)  09/27/13 209 lb (94.802 kg)    Usual Body Weight: 209 lb  % Usual Body Weight: 87%  BMI:  Body mass index is 24.02 kg/(m^2).  Estimated Nutritional Needs: Kcal: 1800-2000 Protein: 90-100 gm Fluid: 1.8-2.0 L  Skin: Intact  Diet Order: Diet heart healthy/carb modified  EDUCATION  NEEDS: -No education needs identified at this time   Intake/Output Summary (Last 24 hours) at 05/31/14 1132 Last data filed at 05/31/14 0900  Gross per 24 hour  Intake  31.83 ml  Output    925 ml  Net -893.17 ml    Labs:   Recent Labs Lab 05/30/14 1701 05/31/14 0315  NA 139 137  K 4.6 5.1  CL 101 100  CO2 24 27  BUN 18 19  CREATININE 0.94 1.08  CALCIUM 8.8 8.8  GLUCOSE 227* 221*    CBG (last 3)   Recent Labs  05/31/14 0513 05/31/14 0917  GLUCAP 190* 177*    Scheduled Meds: . [START ON 06/01/2014] aspirin EC  81 mg Oral Daily  .  ceFAZolin (ANCEF) IV  2 g Intravenous 30 min Pre-Op  . finasteride  5 mg Oral Daily  . insulin aspart  0-9 Units Subcutaneous TID WC  . omega-3 acid ethyl esters  1,000 mg Oral BID  . rosuvastatin  10 mg Oral Daily  . sotalol  80 mg Oral BID    Continuous Infusions: . sodium chloride 75 mL/hr at 05/31/14 4166    Past Medical History  Diagnosis Date  . Diabetes   . Atrial fibrillation   . Gynecomastia   . RBBB   . High cholesterol   . Hiatal hernia   . AAA (abdominal aortic aneurysm)   . Heart murmur   . Neuropathic impotence   . Neuropathy   . Macular degeneration, wet   . Pulmonary fibrosis   .  Liver disease   . Left displaced femoral neck fracture 05/30/2014    Past Surgical History  Procedure Laterality Date  . Colostomy  1960  . Shoulder surgery Right   . Nose surgery      multiple times--broken nose/lacerations    Arthur Holms, RD, LDN Pager #: [phone] After-Hours Pager #: [phone]

## 2014-05-31 NOTE — Progress Notes (Addendum)
[facility] PROGRESS NOTE  [name] [name] ZJI:[id number] DOB: [birth year] DOA: 05/30/2014 PCP: Mathews Argyle, MD  Assessment/Plan: Principal Problem:   Hip fracture, left Active Problems:   Interstitial lung disease   Hyperlipidemia   History of atrial fibrillation   Diabetes mellitus, controlled   Hip fracture      Left hip fracture status post mechanical fall - hypoxic improved and the patient is currently on 2 L of oxygen , active smoker,history of interstitial lung disease . consulted pulmonary critical care for clearance for surgery. Patient will be kept nothing by mouth in anticipation of possible surgery once pulmonary critical care clears.Anticipate surgery Thursday morning per Dr Maree Krabbe started on a diet.  Abnormal troponin-trending down likely secondary to demand ischemia, cardiology consulted, 2-D echo will be ordered,patient needs cardiology clearance prior to surgery  Hypoxia with interstitial lung disease - patient has known history of interstitial lung disease and at this time patient has descalated from a100% nonrebreather 2 nasal cannula, pulmonary critical care following. BNP elevated  Diabetes mellitus type 2 - continue home medications but hold metformin since patient has received contrast. Closely follow CBGs with slight scale coverage.  History of atrial fibrillation status post ablation presently in sinus rhythm - continue sotalol.  Hypertension - continue present medications.  Hyperlipidemia - continue present medications.  Mild anemia - follow CBC and if there is no significant fall in hemoglobin further workup as outpatient  Code Status: full Family Communication: family updated about patient's clinical progress Disposition Plan:  As above    Brief narrative: [name] [name] is a 80 y.o. male with history of atrial fibrillation status post ablation, diabetes mellitus, hypertension and hyperlipidemia had a fall at his house when he was  trying to cross the door. Patient did not hit his head or lose consciousness and patient was brought to the ER. In the ER workup revealed left hip fracture and also right acetabular fracture. Dr. Mardelle Matte was consulted from orthopedics. Patient was found to be hypoxic requiring 100% nonrebreather. Patient otherwise denies any chest pain or shortness of breath or any productive cough fever chills nausea vomiting abdominal pain. Patient has known history of interstitial lung disease and since patient was short of breath CT chest was done which was not showing anything acute except for the known interstitial lung disease. Patient in addition also has right shoulder pain since the fall.  Consultants:  Cardiology  Pulmonary  Orthopedics  Procedures:  none  Antibiotics:  none  HPI/Subjective: Patient complaining of pain in his hip, mild nausea, dry mouth, denies chest pain or shortness of breath  Objective: Filed Vitals:   05/31/14 0700 05/31/14 0800 05/31/14 0823 05/31/14 0900  BP: 108/51 104/51  123/53  Pulse: 67 45  72  Temp:   97.9 F (36.6 C)   TempSrc:   Axillary   Resp: 23 19  24   Height:      Weight:      SpO2: 93% 88%  92%    Intake/Output Summary (Last 24 hours) at 05/31/14 0936 Last data filed at 05/31/14 0900  Gross per 24 hour  Intake  31.83 ml  Output    925 ml  Net -893.17 ml    Exam:   General: Well-built and nourished.  Eyes: anicteric no pallor.  ENT: no discharge from ears eyes nose and mouth.  Neck: no mass felt.  Cardiovascular: S1-S2 heard.  Respiratory: no rhonchi or crepitations.  Abdomen: soft nontender bowel sounds present no guarding or rigidity.  Skin:  no rash.  Musculoskeletal: pain on moving lower extremities. Patient also has pain in my right shoulder.  Psychiatric: appears normal.  Neurologic: alert awake oriented to time place and person. Moves all extremities.      Data Reviewed: Basic Metabolic Panel:  Recent  Labs Lab 05/30/14 1701 05/31/14 0315  NA 139 137  K 4.6 5.1  CL 101 100  CO2 24 27  GLUCOSE 227* 221*  BUN 18 19  CREATININE 0.94 1.08  CALCIUM 8.8 8.8    Liver Function Tests:  Recent Labs Lab 05/31/14 0315  AST 17  ALT 12  ALKPHOS 94  BILITOT 0.4  PROT 6.9  ALBUMIN 3.3*   No results for input(s): LIPASE, AMYLASE in the last 168 hours. No results for input(s): AMMONIA in the last 168 hours.  CBC:  Recent Labs Lab 05/30/14 1701 05/31/14 0315  WBC 9.6 9.9  NEUTROABS 7.9* 8.5*  HGB 12.4* 12.1*  HCT 37.9* 37.4*  MCV 89.4 89.5  PLT 156 151    Cardiac Enzymes:  Recent Labs Lab 05/30/14 1701 05/31/14 0315 05/31/14 0615  TROPONINI <0.30 0.87* 0.75*   BNP (last 3 results)  Recent Labs  05/31/14 0315  PROBNP 7931.0*     CBG:  Recent Labs Lab 05/31/14 0513 05/31/14 0917  GLUCAP 190* 177*    Recent Results (from the past 240 hour(s))  Surgical pcr screen     Status: Abnormal   Collection Time: 05/31/14  1:33 AM  Result Value Ref Range Status   MRSA, PCR NEGATIVE NEGATIVE Final   Staphylococcus aureus POSITIVE (A) NEGATIVE Final    Comment:        The Xpert SA Assay (FDA approved for NASAL specimens in patients over 55 years of age), is one component of a comprehensive surveillance program.  Test performance has been validated by [employer] for patients greater than or equal to 29 year old. It is not intended to diagnose infection nor to guide or monitor treatment. RESULT CALLED TO, READ BACK BY AND VERIFIED WITH: A WEATHERFORD,RN 05/31/14 0400 BY RHOLMES      Studies: Dg Thoracic Spine 2 View  05/30/2014   CLINICAL DATA:  Fall, mid back pain  EXAM: THORACIC SPINE - 2 VIEW  COMPARISON:  None.  FINDINGS: Normal thoracic kyphosis.  No evidence of fracture dislocation. Vertebral body heights are maintained.  Moderate multilevel degenerative changes.  Vascular calcifications.  Visualized lungs are clear.  IMPRESSION: No fracture or  dislocation is seen.  Moderate multilevel degenerative changes.   Electronically Signed   By: Julian Hy M.D.   On: 05/30/2014 19:23   Dg Lumbar Spine Complete  05/30/2014   CLINICAL DATA:  Fall, mid/lower back pain  EXAM: LUMBAR SPINE - COMPLETE 4+ VIEW  COMPARISON:  CT abdomen dated 08/27/2013  FINDINGS: Five lumbar type vertebral bodies.  Normal lumbar lordosis.  No evidence of fracture or dislocation. Vertebral body heights are maintained.  Grade 1 anterolisthesis of L4 on L5.  Moderate multilevel degenerative changes.  Visualized bony pelvis appears intact.  Vascular calcifications with known infrarenal abdominal aortic aneurysm.a  IMPRESSION: No fracture or dislocation is seen.  Moderate degenerative changes.  Grade 1 anterolisthesis of L4 on L5.   Electronically Signed   By: Julian Hy M.D.   On: 05/30/2014 20:01   Dg Shoulder Right  05/30/2014   CLINICAL DATA:  Fall, right shoulder pain  EXAM: RIGHT SHOULDER - 2+ VIEW  COMPARISON:  None.  FINDINGS: No fracture or dislocation is  seen.  Moderate degenerative changes of the glenohumeral joint and acromioclavicular joint  The visualized soft tissues are unremarkable.  Visualized right lung is clear.  IMPRESSION: No fracture or dislocation is seen.  Moderate degenerative changes.   Electronically Signed   By: Julian Hy M.D.   On: 05/30/2014 19:22   Dg Hip Complete Left  05/30/2014   CLINICAL DATA:  Left hip pain after falling at home today at 1500 hr.  EXAM: LEFT HIP - COMPLETE 2+ VIEW  COMPARISON:  Portable pelvis obtained earlier today.  FINDINGS: Left femoral neck fracture with mild valgus angulation. Atheromatous arterial calcifications.  IMPRESSION: Previously noted mildly displaced left femoral neck fracture with mild valgus angulation.   Electronically Signed   By: Enrique Sack M.D.   On: 05/30/2014 19:21   Ct Head Wo Contrast  05/30/2014   CLINICAL DATA:  Right neck pain after falling today and hitting his shoulder against  a door jamb.  EXAM: CT HEAD WITHOUT CONTRAST  CT CERVICAL SPINE WITHOUT CONTRAST  TECHNIQUE: Multidetector CT imaging of the head and cervical spine was performed following the standard protocol without intravenous contrast. Multiplanar CT image reconstructions of the cervical spine were also generated.  COMPARISON:  10/01/2013.  FINDINGS: CT HEAD FINDINGS  Diffusely enlarged ventricles and subarachnoid spaces. Patchy white matter low density in both cerebral hemispheres. No skull fracture, intracranial hemorrhage or paranasal sinus air-fluid levels. Dense arterial calcifications at the skull base.  CT CERVICAL SPINE FINDINGS  Extensive multilevel cervical spine degenerative changes. No prevertebral soft tissue swelling, fractures or subluxations. Bilateral vertebral and carotid artery atheromatous calcifications. Bullous changes at both lung apices.  IMPRESSION: 1. No skull fracture or intracranial hemorrhage. 2. No cervical spine fracture or subluxation. 3. Stable mid atrophy and chronic small vessel white matter ischemic changes. 4. Stable marked cervical spine degenerative changes. 5. COPD.   Electronically Signed   By: Enrique Sack M.D.   On: 05/30/2014 19:07   Ct Chest W Contrast  05/30/2014   CLINICAL DATA:  [place] today. Complaining of right shoulder and left hip pain.  EXAM: CT CHEST, ABDOMEN, AND PELVIS WITH CONTRAST  TECHNIQUE: Multidetector CT imaging of the chest, abdomen and pelvis was performed following the standard protocol during bolus administration of intravenous contrast.  CONTRAST:  167mL OMNIPAQUE IOHEXOL 300 MG/ML  SOLN  COMPARISON:  Chest CT, 08/30/2013.  FINDINGS: CT CHEST FINDINGS  Heart is mildly enlarged. There are dense coronary artery and mitral valve annular calcifications. Aorta is normal in caliber. There is partly calcified mild plaque along the thoracic aorta and at the origin of the left subclavian artery. No significant stenosis.  Right left pulmonary arteries are dilated to 3  cm, stable.  Borderline enlarged right peritracheal lymph node measures 1 cm short axis, stable. No mediastinal masses. No hilar masses or adenopathy.  Lungs show heterogeneous coarse peripheral reticular opacities consistent with interstitial fibrosis, with a pattern suggesting UIP. Focal opacity noted in the left lower lobe on the prior exam measuring 15 mm x 7 mm, is without significant change. No acute lung consolidation or evidence of edema. No pleural effusion or pneumothorax.  CT ABDOMEN AND PELVIS FINDINGS  Liver shows morphologic changes consistent with cirrhosis. No liver mass or focal lesion. No contusion or laceration.  Mean is prominent measuring 12.4 cm in greatest dimension. No splenic mass or focal lesion.  Gallbladder is unremarkable. No bile duct dilation. No pancreatic masses or inflammation. No adrenal masses.  Mild bilateral renal cortical thinning.  There is a peripherally calcified hypo attenuating, 3.1 cm mass protruding from the inferior margin of the left kidney, not imaged on the prior chest CT. This is likely a mildly complicated cyst. There is a low attenuation lesion in the upper pole left kidney, most likely a simple cyst. No other renal masses or lesions. No hydronephrosis. Normal ureters. Bladder is unremarkable.  There is an infrarenal abdominal aortic aneurysm measuring 3.7 cm in greatest anterior-posterior dimension. No evidence of rupture. Atherosclerotic changes noted along the abdominal aorta and its branch vessels.  No pathologically enlarged lymph nodes. No abnormal fluid collections. Specifically no evidence of hemo peritoneum.  No bowel wall thickening to suggest a bowel hematoma. No bowel inflammatory changes. No mesenteric inflammation or contusion/hematoma.  MUSCULOSKELETAL:  There fractures of the right acetabulum. Fracture extends across the posterior column, extending across the medial acetabulum to the anterior,. Fracture is mildly distracted by 7 mm. There is mild  fracture comminution with small fracture fragments noted between the major fracture components, at least 1 within the hip joint.  There is a fracture of the left femoral neck. This is subcapital, oblique and non comminuted. Fracture is displaced with the distal fracture component migrating superiorly by 17 mm. There is also mild varus angulation as well as significant apex anterior angulation.  No other pelvic or proximal femur fractures.  There are no other fractures.  IMPRESSION: 1. Fractures of the right acetabulum involving the anterior posterior columns. 2. Fracture of the left femoral neck, mildly displaced and angulated. 3. No other fractures. 4. No other acute findings in the chest, abdomen or pelvis. No other evidence of acute injury. 5. Chronic findings include cardiomegaly, coronary artery calcifications and changes consistent with interstitial fibrosis. Pulmonary artery dilation suggest pulmonary artery hypertension. Below the diaphragm, there changes of cirrhosis with borderline splenomegaly. Probable left renal cysts. 6. 3.7 cm infrarenal abdominal aortic aneurysm. No evidence of rupture.   Electronically Signed   By: Lajean Manes M.D.   On: 05/30/2014 22:06   Ct Cervical Spine Wo Contrast  05/30/2014   CLINICAL DATA:  Right neck pain after falling today and hitting his shoulder against a door jamb.  EXAM: CT HEAD WITHOUT CONTRAST  CT CERVICAL SPINE WITHOUT CONTRAST  TECHNIQUE: Multidetector CT imaging of the head and cervical spine was performed following the standard protocol without intravenous contrast. Multiplanar CT image reconstructions of the cervical spine were also generated.  COMPARISON:  10/01/2013.  FINDINGS: CT HEAD FINDINGS  Diffusely enlarged ventricles and subarachnoid spaces. Patchy white matter low density in both cerebral hemispheres. No skull fracture, intracranial hemorrhage or paranasal sinus air-fluid levels. Dense arterial calcifications at the skull base.  CT CERVICAL  SPINE FINDINGS  Extensive multilevel cervical spine degenerative changes. No prevertebral soft tissue swelling, fractures or subluxations. Bilateral vertebral and carotid artery atheromatous calcifications. Bullous changes at both lung apices.  IMPRESSION: 1. No skull fracture or intracranial hemorrhage. 2. No cervical spine fracture or subluxation. 3. Stable mid atrophy and chronic small vessel white matter ischemic changes. 4. Stable marked cervical spine degenerative changes. 5. COPD.   Electronically Signed   By: Enrique Sack M.D.   On: 05/30/2014 19:07   Ct Abdomen Pelvis W Contrast  05/30/2014   CLINICAL DATA:  [place] today. Complaining of right shoulder and left hip pain.  EXAM: CT CHEST, ABDOMEN, AND PELVIS WITH CONTRAST  TECHNIQUE: Multidetector CT imaging of the chest, abdomen and pelvis was performed following the standard protocol during bolus administration of intravenous  contrast.  CONTRAST:  162mL OMNIPAQUE IOHEXOL 300 MG/ML  SOLN  COMPARISON:  Chest CT, 08/30/2013.  FINDINGS: CT CHEST FINDINGS  Heart is mildly enlarged. There are dense coronary artery and mitral valve annular calcifications. Aorta is normal in caliber. There is partly calcified mild plaque along the thoracic aorta and at the origin of the left subclavian artery. No significant stenosis.  Right left pulmonary arteries are dilated to 3 cm, stable.  Borderline enlarged right peritracheal lymph node measures 1 cm short axis, stable. No mediastinal masses. No hilar masses or adenopathy.  Lungs show heterogeneous coarse peripheral reticular opacities consistent with interstitial fibrosis, with a pattern suggesting UIP. Focal opacity noted in the left lower lobe on the prior exam measuring 15 mm x 7 mm, is without significant change. No acute lung consolidation or evidence of edema. No pleural effusion or pneumothorax.  CT ABDOMEN AND PELVIS FINDINGS  Liver shows morphologic changes consistent with cirrhosis. No liver mass or focal lesion.  No contusion or laceration.  Mean is prominent measuring 12.4 cm in greatest dimension. No splenic mass or focal lesion.  Gallbladder is unremarkable. No bile duct dilation. No pancreatic masses or inflammation. No adrenal masses.  Mild bilateral renal cortical thinning. There is a peripherally calcified hypo attenuating, 3.1 cm mass protruding from the inferior margin of the left kidney, not imaged on the prior chest CT. This is likely a mildly complicated cyst. There is a low attenuation lesion in the upper pole left kidney, most likely a simple cyst. No other renal masses or lesions. No hydronephrosis. Normal ureters. Bladder is unremarkable.  There is an infrarenal abdominal aortic aneurysm measuring 3.7 cm in greatest anterior-posterior dimension. No evidence of rupture. Atherosclerotic changes noted along the abdominal aorta and its branch vessels.  No pathologically enlarged lymph nodes. No abnormal fluid collections. Specifically no evidence of hemo peritoneum.  No bowel wall thickening to suggest a bowel hematoma. No bowel inflammatory changes. No mesenteric inflammation or contusion/hematoma.  MUSCULOSKELETAL:  There fractures of the right acetabulum. Fracture extends across the posterior column, extending across the medial acetabulum to the anterior,. Fracture is mildly distracted by 7 mm. There is mild fracture comminution with small fracture fragments noted between the major fracture components, at least 1 within the hip joint.  There is a fracture of the left femoral neck. This is subcapital, oblique and non comminuted. Fracture is displaced with the distal fracture component migrating superiorly by 17 mm. There is also mild varus angulation as well as significant apex anterior angulation.  No other pelvic or proximal femur fractures.  There are no other fractures.  IMPRESSION: 1. Fractures of the right acetabulum involving the anterior posterior columns. 2. Fracture of the left femoral neck, mildly  displaced and angulated. 3. No other fractures. 4. No other acute findings in the chest, abdomen or pelvis. No other evidence of acute injury. 5. Chronic findings include cardiomegaly, coronary artery calcifications and changes consistent with interstitial fibrosis. Pulmonary artery dilation suggest pulmonary artery hypertension. Below the diaphragm, there changes of cirrhosis with borderline splenomegaly. Probable left renal cysts. 6. 3.7 cm infrarenal abdominal aortic aneurysm. No evidence of rupture.   Electronically Signed   By: Lajean Manes M.D.   On: 05/30/2014 22:06   Dg Pelvis Portable  05/30/2014   CLINICAL DATA:  [place] today at 3 o'clock. Tripped over a door way. Left hip pain.  EXAM: PORTABLE PELVIS 1-2 VIEWS PORTABLE ONE-VIEW CHEST X-RAY.  COMPARISON:  HIP RADIOGRAPHS 10/01/2013 AND CHEST X-RAY  08/26/2013.  FINDINGS: CHEST X-RAY:  The heart is mildly enlarged but stable. There is tortuosity and calcification of the thoracic aorta. Chronic lung changes with pulmonary fibrosis. No definite acute overlying pulmonary process. The bony thorax is grossly intact.  Pelvis:  There is a displaced left femoral neck fracture. The right hip is intact. Moderate degenerative changes bilaterally, right greater than left. The pubic symphysis and SI joints are intact. No pelvic fractures. Extensive vascular calcifications are noted.  IMPRESSION: 1. Displaced left femoral neck fracture. 2. No acute cardiopulmonary findings. Chronic lung changes/pulmonary fibrosis.   Electronically Signed   By: Kalman Jewels M.D.   On: 05/30/2014 17:36   Dg Chest Port 1 View  05/31/2014   CLINICAL DATA:  80 year old male with low oxygen level. Subsequent encounter.  EXAM: PORTABLE CHEST - 1 VIEW  COMPARISON:  05/30/2014 and 08/26/2013.  FINDINGS: Pulmonary vascular congestion superimposed upon chronic lung changes.  Elevated left hemidiaphragm with left base subsegmental atelectasis.  No gross pneumothorax.  Cardiomegaly.  Calcified  mildly tortuous aorta.  IMPRESSION: Pulmonary vascular congestion superimposed upon chronic lung changes.  Elevated left hemidiaphragm with left base subsegmental atelectasis.  No gross pneumothorax.  Cardiomegaly.  Calcified mildly tortuous aorta.   Electronically Signed   By: Chauncey Cruel M.D.   On: 05/31/2014 06:55   Dg Chest Port 1 View  05/30/2014   CLINICAL DATA:  80 year old male who fell at 1500 hr today tripping over door way. Acute pain. Initial encounter.  EXAM: PORTABLE CHEST - 1 VIEW  COMPARISON:  Chest CT 08/30/2013 and earlier.  FINDINGS: Portable AP semi upright view at 1716 hr. Lower lung volumes. Stable cardiomegaly and mediastinal contours. Peripheral increased opacity greater in the left lung is stable. No pneumothorax, pulmonary edema, or pleural effusion identified. No acute pulmonary opacity identified. Calcified atherosclerosis of the aorta. Visualized tracheal air column is within normal limits. No acute osseous abnormality identified.  IMPRESSION: Cardiomegaly and chronic lung disease. No superimposed acute findings are identified.   Electronically Signed   By: Lars Pinks M.D.   On: 05/30/2014 17:35    Scheduled Meds: . aspirin  325 mg Oral Daily  .  ceFAZolin (ANCEF) IV  2 g Intravenous 30 min Pre-Op  . finasteride  5 mg Oral Daily  . [START ON 06/01/2014] glipiZIDE  20 mg Oral Q breakfast  . insulin aspart  0-9 Units Subcutaneous TID WC  . omega-3 acid ethyl esters  1,000 mg Oral BID  . rosuvastatin  10 mg Oral Daily  . sotalol  80 mg Oral BID   Continuous Infusions: . sodium chloride 75 mL/hr at 05/31/14 7412    Principal Problem:   Hip fracture, left Active Problems:   Interstitial lung disease   Hyperlipidemia   History of atrial fibrillation   Diabetes mellitus, controlled   Hip fracture    Time spent: 40 minutes   [facility] Pager [phone]. If 7PM-7AM, please contact night-coverage at [email or web address], password [facility] 05/31/2014,  9:36 AM  LOS: 1 day

## 2014-05-31 NOTE — Consult Note (Signed)
CARDIOLOGY CONSULT NOTE   Patient ID: [name] [name] MRN: [id number], DOB/AGE: Jan 11, 1927   Admit date: 05/30/2014 Date of Consult: 05/31/2014   Primary Physician: Mathews Argyle, MD Primary Cardiologist: None  Pt. Profile  80 year old gentleman admitted with left femoral neck fracture and right transverse acetabular fracture.  Patient has a history of severe COPD.  He has a remote history of atrial fibrillation treated with successful ablation approximately 10 years ago elsewhere.  Problem List  Past Medical History  Diagnosis Date  . Diabetes   . Atrial fibrillation   . Gynecomastia   . RBBB   . High cholesterol   . Hiatal hernia   . AAA (abdominal aortic aneurysm)   . Heart murmur   . Neuropathic impotence   . Neuropathy   . Macular degeneration, wet   . Pulmonary fibrosis   . Liver disease   . Left displaced femoral neck fracture 05/30/2014    Past Surgical History  Procedure Laterality Date  . Colostomy  1960  . Shoulder surgery Right   . Nose surgery      multiple times--broken nose/lacerations     Allergies  No Known Allergies  HPI   This 80 year old gentleman is admitted with left femoral neck fracture and right transverse acetabular fracture with medial displacement of the quadrilateral plate.  The patient has been in poor overall health.  He lives with his son.  He walks from one room to the next.  He continues to smoke about 15 cigarettes a day.  He has not been experiencing any chest pain or angina.  He does have shortness of breath with minimal activity.  He saw Dr. Gwenette Greet about 6 months ago and the family was told that the patient had some interstitial lung disease.  The [place] and since then has been maintaining normal sinus rhythm on low dose sotalol.  He has had no recurrent atrial fibrillation according to the family.  He has been followed by his PCP Dr. Felipa Eth.   The family notes that the patient has a known small abdominal aortic aneurysm which has been followed.  The patient is diabetic on oral agents. On admission today his troponin is slightly elevated and his electrocardiogram demonstrates some new inferolateral T-wave inversion.  Patient denies any substernal chest discomfort but is complaining of right shoulder pain. Inpatient Medications  . aspirin  325 mg Oral Daily  .  ceFAZolin (ANCEF) IV  2 g Intravenous 30 min Pre-Op  . finasteride  5 mg Oral Daily  . [START ON 06/01/2014] glipiZIDE  20 mg Oral Q breakfast  . insulin aspart  0-9 Units Subcutaneous TID WC  . omega-3 acid ethyl esters  1,000 mg Oral BID  . rosuvastatin  10 mg Oral Daily  . sotalol  80 mg Oral BID    Family History Family History  Problem Relation Age of Onset  . Rectal cancer Father   . COPD Brother      Social History History   Social History  . Marital Status: Widowed    Spouse Name: N/A    Number of Children: N/A  . Years of Education: N/A   Occupational History  . retired    Social History Main Topics  . Smoking status: Current Every Day Smoker -- 0.50 packs/day for 63 years    Types: Cigarettes  . Smokeless tobacco: Not on file     Comment: smoke about 5-6  cigs qd  . Alcohol Use: No     Comment: quit 8 years ago  . Drug Use: No  . Sexual Activity: Not on file   Other Topics Concern  . Not on file   Social History Narrative     Review of Systems  General:  No chills, fever, night sweats or weight changes.  Cardiovascular:  No chest pain, positive for dyspnea on exertion edema, orthopnea, palpitations, paroxysmal nocturnal dyspnea. Dermatological: No rash, lesions/masses Respiratory: No cough, dyspnea Urologic: No hematuria, dysuria Abdominal:   No nausea, vomiting, diarrhea, bright red blood per rectum, melena, or hematemesis Neurologic:  No visual changes, wkns, changes in mental status. All other systems reviewed and are otherwise  negative except as noted above.  Physical Exam  Blood pressure 123/53, pulse 72, temperature 97.9 F (36.6 C), temperature source Axillary, resp. rate 24, height 6\' 1"  (1.854 m), weight 182 lb (82.555 kg), SpO2 92 %.  General: Pleasant, NAD Psych: Normal affect. Neuro: Alert and oriented X 3.  HEENT: Normal  Neck: Supple without bruits or JVD. Lungs:  Chest reveals coarse inspiratory Current rhonchi bilaterally Heart: RRR no s3, s4, or murmurs. Abdomen: Soft, non-tender, non-distended, BS + x 4.  Extremities: No clubbing, cyanosis or edema. The left leg is foreshortened and externally rotated.  Labs   Recent Labs  05/30/14 1701 05/31/14 0315 05/31/14 0615  TROPONINI <0.30 0.87* 0.75*   Lab Results  Component Value Date   WBC 9.9 05/31/2014   HGB 12.1* 05/31/2014   HCT 37.4* 05/31/2014   MCV 89.5 05/31/2014   PLT 151 05/31/2014     Recent Labs Lab 05/31/14 0315  NA 137  K 5.1  CL 100  CO2 27  BUN 19  CREATININE 1.08  CALCIUM 8.8  PROT 6.9  BILITOT 0.4  ALKPHOS 94  ALT 12  AST 17  GLUCOSE 221*   No results found for: CHOL, HDL, LDLCALC, TRIG No results found for: DDIMER  Radiology/Studies  Dg Thoracic Spine 2 View  05/30/2014   CLINICAL DATA:  Fall, mid back pain  EXAM: THORACIC SPINE - 2 VIEW  COMPARISON:  None.  FINDINGS: Normal thoracic kyphosis.  No evidence of fracture dislocation. Vertebral body heights are maintained.  Moderate multilevel degenerative changes.  Vascular calcifications.  Visualized lungs are clear.  IMPRESSION: No fracture or dislocation is seen.  Moderate multilevel degenerative changes.   Electronically Signed   By: Julian Hy M.D.   On: 05/30/2014 19:23   Dg Lumbar Spine Complete  05/30/2014   CLINICAL DATA:  Fall, mid/lower back pain  EXAM: LUMBAR SPINE - COMPLETE 4+ VIEW  COMPARISON:  CT abdomen dated 08/27/2013  FINDINGS: Five lumbar type vertebral bodies.  Normal lumbar lordosis.  No evidence of fracture or dislocation.  Vertebral body heights are maintained.  Grade 1 anterolisthesis of L4 on L5.  Moderate multilevel degenerative changes.  Visualized bony pelvis appears intact.  Vascular calcifications with known infrarenal abdominal aortic aneurysm.a  IMPRESSION: No fracture or dislocation is seen.  Moderate degenerative changes.  Grade 1 anterolisthesis of L4 on L5.   Electronically Signed   By: Julian Hy M.D.   On: 05/30/2014 20:01   Dg Shoulder Right  05/30/2014   CLINICAL DATA:  Fall, right shoulder pain  EXAM: RIGHT SHOULDER - 2+ VIEW  COMPARISON:  None.  FINDINGS: No fracture or dislocation is seen.  Moderate degenerative changes of the glenohumeral joint and acromioclavicular joint  The visualized soft tissues are unremarkable.  Visualized  right lung is clear.  IMPRESSION: No fracture or dislocation is seen.  Moderate degenerative changes.   Electronically Signed   By: Julian Hy M.D.   On: 05/30/2014 19:22   Dg Hip Complete Left  05/30/2014   CLINICAL DATA:  Left hip pain after falling at home today at 1500 hr.  EXAM: LEFT HIP - COMPLETE 2+ VIEW  COMPARISON:  Portable pelvis obtained earlier today.  FINDINGS: Left femoral neck fracture with mild valgus angulation. Atheromatous arterial calcifications.  IMPRESSION: Previously noted mildly displaced left femoral neck fracture with mild valgus angulation.   Electronically Signed   By: Enrique Sack M.D.   On: 05/30/2014 19:21   Ct Head Wo Contrast  05/30/2014   CLINICAL DATA:  Right neck pain after falling today and hitting his shoulder against a door jamb.  EXAM: CT HEAD WITHOUT CONTRAST  CT CERVICAL SPINE WITHOUT CONTRAST  TECHNIQUE: Multidetector CT imaging of the head and cervical spine was performed following the standard protocol without intravenous contrast. Multiplanar CT image reconstructions of the cervical spine were also generated.  COMPARISON:  10/01/2013.  FINDINGS: CT HEAD FINDINGS  Diffusely enlarged ventricles and subarachnoid spaces.  Patchy white matter low density in both cerebral hemispheres. No skull fracture, intracranial hemorrhage or paranasal sinus air-fluid levels. Dense arterial calcifications at the skull base.  CT CERVICAL SPINE FINDINGS  Extensive multilevel cervical spine degenerative changes. No prevertebral soft tissue swelling, fractures or subluxations. Bilateral vertebral and carotid artery atheromatous calcifications. Bullous changes at both lung apices.  IMPRESSION: 1. No skull fracture or intracranial hemorrhage. 2. No cervical spine fracture or subluxation. 3. Stable mid atrophy and chronic small vessel white matter ischemic changes. 4. Stable marked cervical spine degenerative changes. 5. COPD.   Electronically Signed   By: Enrique Sack M.D.   On: 05/30/2014 19:07   Ct Chest W Contrast  05/30/2014   CLINICAL DATA:  [place] today. Complaining of right shoulder and left hip pain.  EXAM: CT CHEST, ABDOMEN, AND PELVIS WITH CONTRAST  TECHNIQUE: Multidetector CT imaging of the chest, abdomen and pelvis was performed following the standard protocol during bolus administration of intravenous contrast.  CONTRAST:  11mL OMNIPAQUE IOHEXOL 300 MG/ML  SOLN  COMPARISON:  Chest CT, 08/30/2013.  FINDINGS: CT CHEST FINDINGS  Heart is mildly enlarged. There are dense coronary artery and mitral valve annular calcifications. Aorta is normal in caliber. There is partly calcified mild plaque along the thoracic aorta and at the origin of the left subclavian artery. No significant stenosis.  Right left pulmonary arteries are dilated to 3 cm, stable.  Borderline enlarged right peritracheal lymph node measures 1 cm short axis, stable. No mediastinal masses. No hilar masses or adenopathy.  Lungs show heterogeneous coarse peripheral reticular opacities consistent with interstitial fibrosis, with a pattern suggesting UIP. Focal opacity noted in the left lower lobe on the prior exam measuring 15 mm x 7 mm, is without significant change. No acute lung  consolidation or evidence of edema. No pleural effusion or pneumothorax.  CT ABDOMEN AND PELVIS FINDINGS  Liver shows morphologic changes consistent with cirrhosis. No liver mass or focal lesion. No contusion or laceration.  Mean is prominent measuring 12.4 cm in greatest dimension. No splenic mass or focal lesion.  Gallbladder is unremarkable. No bile duct dilation. No pancreatic masses or inflammation. No adrenal masses.  Mild bilateral renal cortical thinning. There is a peripherally calcified hypo attenuating, 3.1 cm mass protruding from the inferior margin of the left kidney, not imaged  on the prior chest CT. This is likely a mildly complicated cyst. There is a low attenuation lesion in the upper pole left kidney, most likely a simple cyst. No other renal masses or lesions. No hydronephrosis. Normal ureters. Bladder is unremarkable.  There is an infrarenal abdominal aortic aneurysm measuring 3.7 cm in greatest anterior-posterior dimension. No evidence of rupture. Atherosclerotic changes noted along the abdominal aorta and its branch vessels.  No pathologically enlarged lymph nodes. No abnormal fluid collections. Specifically no evidence of hemo peritoneum.  No bowel wall thickening to suggest a bowel hematoma. No bowel inflammatory changes. No mesenteric inflammation or contusion/hematoma.  MUSCULOSKELETAL:  There fractures of the right acetabulum. Fracture extends across the posterior column, extending across the medial acetabulum to the anterior,. Fracture is mildly distracted by 7 mm. There is mild fracture comminution with small fracture fragments noted between the major fracture components, at least 1 within the hip joint.  There is a fracture of the left femoral neck. This is subcapital, oblique and non comminuted. Fracture is displaced with the distal fracture component migrating superiorly by 17 mm. There is also mild varus angulation as well as significant apex anterior angulation.  No other pelvic or  proximal femur fractures.  There are no other fractures.  IMPRESSION: 1. Fractures of the right acetabulum involving the anterior posterior columns. 2. Fracture of the left femoral neck, mildly displaced and angulated. 3. No other fractures. 4. No other acute findings in the chest, abdomen or pelvis. No other evidence of acute injury. 5. Chronic findings include cardiomegaly, coronary artery calcifications and changes consistent with interstitial fibrosis. Pulmonary artery dilation suggest pulmonary artery hypertension. Below the diaphragm, there changes of cirrhosis with borderline splenomegaly. Probable left renal cysts. 6. 3.7 cm infrarenal abdominal aortic aneurysm. No evidence of rupture.   Electronically Signed   By: Lajean Manes M.D.   On: 05/30/2014 22:06   Ct Cervical Spine Wo Contrast  05/30/2014   CLINICAL DATA:  Right neck pain after falling today and hitting his shoulder against a door jamb.  EXAM: CT HEAD WITHOUT CONTRAST  CT CERVICAL SPINE WITHOUT CONTRAST  TECHNIQUE: Multidetector CT imaging of the head and cervical spine was performed following the standard protocol without intravenous contrast. Multiplanar CT image reconstructions of the cervical spine were also generated.  COMPARISON:  10/01/2013.  FINDINGS: CT HEAD FINDINGS  Diffusely enlarged ventricles and subarachnoid spaces. Patchy white matter low density in both cerebral hemispheres. No skull fracture, intracranial hemorrhage or paranasal sinus air-fluid levels. Dense arterial calcifications at the skull base.  CT CERVICAL SPINE FINDINGS  Extensive multilevel cervical spine degenerative changes. No prevertebral soft tissue swelling, fractures or subluxations. Bilateral vertebral and carotid artery atheromatous calcifications. Bullous changes at both lung apices.  IMPRESSION: 1. No skull fracture or intracranial hemorrhage. 2. No cervical spine fracture or subluxation. 3. Stable mid atrophy and chronic small vessel white matter ischemic  changes. 4. Stable marked cervical spine degenerative changes. 5. COPD.   Electronically Signed   By: Enrique Sack M.D.   On: 05/30/2014 19:07   Ct Abdomen Pelvis W Contrast  05/30/2014   CLINICAL DATA:  [place] today. Complaining of right shoulder and left hip pain.  EXAM: CT CHEST, ABDOMEN, AND PELVIS WITH CONTRAST  TECHNIQUE: Multidetector CT imaging of the chest, abdomen and pelvis was performed following the standard protocol during bolus administration of intravenous contrast.  CONTRAST:  127mL OMNIPAQUE IOHEXOL 300 MG/ML  SOLN  COMPARISON:  Chest CT, 08/30/2013.  FINDINGS: CT CHEST  FINDINGS  Heart is mildly enlarged. There are dense coronary artery and mitral valve annular calcifications. Aorta is normal in caliber. There is partly calcified mild plaque along the thoracic aorta and at the origin of the left subclavian artery. No significant stenosis.  Right left pulmonary arteries are dilated to 3 cm, stable.  Borderline enlarged right peritracheal lymph node measures 1 cm short axis, stable. No mediastinal masses. No hilar masses or adenopathy.  Lungs show heterogeneous coarse peripheral reticular opacities consistent with interstitial fibrosis, with a pattern suggesting UIP. Focal opacity noted in the left lower lobe on the prior exam measuring 15 mm x 7 mm, is without significant change. No acute lung consolidation or evidence of edema. No pleural effusion or pneumothorax.  CT ABDOMEN AND PELVIS FINDINGS  Liver shows morphologic changes consistent with cirrhosis. No liver mass or focal lesion. No contusion or laceration.  Mean is prominent measuring 12.4 cm in greatest dimension. No splenic mass or focal lesion.  Gallbladder is unremarkable. No bile duct dilation. No pancreatic masses or inflammation. No adrenal masses.  Mild bilateral renal cortical thinning. There is a peripherally calcified hypo attenuating, 3.1 cm mass protruding from the inferior margin of the left kidney, not imaged on the prior chest  CT. This is likely a mildly complicated cyst. There is a low attenuation lesion in the upper pole left kidney, most likely a simple cyst. No other renal masses or lesions. No hydronephrosis. Normal ureters. Bladder is unremarkable.  There is an infrarenal abdominal aortic aneurysm measuring 3.7 cm in greatest anterior-posterior dimension. No evidence of rupture. Atherosclerotic changes noted along the abdominal aorta and its branch vessels.  No pathologically enlarged lymph nodes. No abnormal fluid collections. Specifically no evidence of hemo peritoneum.  No bowel wall thickening to suggest a bowel hematoma. No bowel inflammatory changes. No mesenteric inflammation or contusion/hematoma.  MUSCULOSKELETAL:  There fractures of the right acetabulum. Fracture extends across the posterior column, extending across the medial acetabulum to the anterior,. Fracture is mildly distracted by 7 mm. There is mild fracture comminution with small fracture fragments noted between the major fracture components, at least 1 within the hip joint.  There is a fracture of the left femoral neck. This is subcapital, oblique and non comminuted. Fracture is displaced with the distal fracture component migrating superiorly by 17 mm. There is also mild varus angulation as well as significant apex anterior angulation.  No other pelvic or proximal femur fractures.  There are no other fractures.  IMPRESSION: 1. Fractures of the right acetabulum involving the anterior posterior columns. 2. Fracture of the left femoral neck, mildly displaced and angulated. 3. No other fractures. 4. No other acute findings in the chest, abdomen or pelvis. No other evidence of acute injury. 5. Chronic findings include cardiomegaly, coronary artery calcifications and changes consistent with interstitial fibrosis. Pulmonary artery dilation suggest pulmonary artery hypertension. Below the diaphragm, there changes of cirrhosis with borderline splenomegaly. Probable left  renal cysts. 6. 3.7 cm infrarenal abdominal aortic aneurysm. No evidence of rupture.   Electronically Signed   By: Lajean Manes M.D.   On: 05/30/2014 22:06   Dg Pelvis Portable  05/30/2014   CLINICAL DATA:  [place] today at 3 o'clock. Tripped over a door way. Left hip pain.  EXAM: PORTABLE PELVIS 1-2 VIEWS PORTABLE ONE-VIEW CHEST X-RAY.  COMPARISON:  HIP RADIOGRAPHS 10/01/2013 AND CHEST X-RAY 08/26/2013.  FINDINGS: CHEST X-RAY:  The heart is mildly enlarged but stable. There is tortuosity and calcification of the thoracic  aorta. Chronic lung changes with pulmonary fibrosis. No definite acute overlying pulmonary process. The bony thorax is grossly intact.  Pelvis:  There is a displaced left femoral neck fracture. The right hip is intact. Moderate degenerative changes bilaterally, right greater than left. The pubic symphysis and SI joints are intact. No pelvic fractures. Extensive vascular calcifications are noted.  IMPRESSION: 1. Displaced left femoral neck fracture. 2. No acute cardiopulmonary findings. Chronic lung changes/pulmonary fibrosis.   Electronically Signed   By: Kalman Jewels M.D.   On: 05/30/2014 17:36   Dg Chest Port 1 View  05/31/2014   CLINICAL DATA:  80 year old male with low oxygen level. Subsequent encounter.  EXAM: PORTABLE CHEST - 1 VIEW  COMPARISON:  05/30/2014 and 08/26/2013.  FINDINGS: Pulmonary vascular congestion superimposed upon chronic lung changes.  Elevated left hemidiaphragm with left base subsegmental atelectasis.  No gross pneumothorax.  Cardiomegaly.  Calcified mildly tortuous aorta.  IMPRESSION: Pulmonary vascular congestion superimposed upon chronic lung changes.  Elevated left hemidiaphragm with left base subsegmental atelectasis.  No gross pneumothorax.  Cardiomegaly.  Calcified mildly tortuous aorta.   Electronically Signed   By: Chauncey Cruel M.D.   On: 05/31/2014 06:55   Dg Chest Port 1 View  05/30/2014   CLINICAL DATA:  80 year old male who fell at 1500 hr today  tripping over door way. Acute pain. Initial encounter.  EXAM: PORTABLE CHEST - 1 VIEW  COMPARISON:  Chest CT 08/30/2013 and earlier.  FINDINGS: Portable AP semi upright view at 1716 hr. Lower lung volumes. Stable cardiomegaly and mediastinal contours. Peripheral increased opacity greater in the left lung is stable. No pneumothorax, pulmonary edema, or pleural effusion identified. No acute pulmonary opacity identified. Calcified atherosclerosis of the aorta. Visualized tracheal air column is within normal limits. No acute osseous abnormality identified.  IMPRESSION: Cardiomegaly and chronic lung disease. No superimposed acute findings are identified.   Electronically Signed   By: Lars Pinks M.D.   On: 05/30/2014 17:35    ECG EKG #1 done on 05/30/14 at 1650: Sinus rhythm Ventricular premature complex Prolonged PR interval RBBB and LAFB  EKG #2 done at 0504: Sinus rhythm with marked sinus arrhythmia with 1st degree A-V block with frequent Premature ventricular complexes Left axis deviation Right bundle branch block T wave abnormality, consider inferolateral ischemia Abnormal ECG  ASSESSMENT AND PLAN  1. Elevated troponins, now trending down, consistent with supply demand mismatch and hypoxia secondary to the stress of his fall and hip fracture.  EKG today shows new inferolateral T-wave abnormalities.  Patient denies chest pain  2.  Severe COPD/pulmonary fibrosis with ongoing tobacco abuse  3.  Past history of atrial fibrillation, currently holding normal sinus rhythm following ablation about 8 years ago.  He has been on long-term sotalol ever since.  4.  Mechanical fall with fracture of right acetabulum and left femoral neck.  5.  Frailty  Recommendation: Will get two-dimensional echocardiogram to assess wall motion.  We will continue his sotalol and his statin therapy.  Daily aspirin has been added to his regimen. The patient would appear to be at moderately high risk for general  anesthesia from a cardiac standpoint and a pulmonary standpoint.  Pulmonary consultation is pending Will follow. Signed, Darlin Coco, MD  05/31/2014, 9:51 AM

## 2014-05-31 NOTE — Progress Notes (Signed)
ANTIBIOTIC CONSULT NOTE - INITIAL  Pharmacy Consult for Rocephin Indication: UTI  No Known Allergies  Patient Measurements: Height: 6\' 1"  (185.4 cm) Weight: 182 lb (82.555 kg) IBW/kg (Calculated) : 79.9   Vital Signs: Temp: 98 F (36.7 C) (11/03 1900) Temp Source: Oral (11/03 1900) BP: 108/55 mmHg (11/03 2000) Pulse Rate: 73 (11/03 2000) Intake/Output from previous day: 11/02 0701 - 11/03 0700 In: 31.8 [I.V.:31.8] Out: 800 [Urine:800] Intake/Output from this shift: Total I/O In: 150 [I.V.:150] Out: 20 [Urine:20]  Labs:  Recent Labs  05/30/14 1701 05/31/14 0315 05/31/14 2013  WBC 9.6 9.9 9.0  HGB 12.4* 12.1* 11.6*  PLT 156 151 125*  CREATININE 0.94 1.08  --    Estimated Creatinine Clearance: 56.5 mL/min (by C-G formula based on Cr of 1.08). No results for input(s): VANCOTROUGH, VANCOPEAK, VANCORANDOM, GENTTROUGH, GENTPEAK, GENTRANDOM, TOBRATROUGH, TOBRAPEAK, TOBRARND, AMIKACINPEAK, AMIKACINTROU, AMIKACIN in the last 72 hours.   Microbiology: Recent Results (from the past 720 hour(s))  Surgical pcr screen     Status: Abnormal   Collection Time: 05/31/14  1:33 AM  Result Value Ref Range Status   MRSA, PCR NEGATIVE NEGATIVE Final   Staphylococcus aureus POSITIVE (A) NEGATIVE Final    Comment:        The Xpert SA Assay (FDA approved for NASAL specimens in patients over 28 years of age), is one component of a comprehensive surveillance program.  Test performance has been validated by [employer] for patients greater than or equal to 78 year old. It is not intended to diagnose infection nor to guide or monitor treatment. RESULT CALLED TO, READ BACK BY AND VERIFIED WITH: A WEATHERFORD,RN 05/31/14 0400 BY RHOLMES     Medical History: Past Medical History  Diagnosis Date  . Diabetes   . Atrial fibrillation   . Gynecomastia   . RBBB   . High cholesterol   . Hiatal hernia   . AAA (abdominal aortic aneurysm)   . Heart murmur   . Neuropathic impotence    . Neuropathy   . Macular degeneration, wet   . Pulmonary fibrosis   . Liver disease   . Left displaced femoral neck fracture 05/30/2014    Assessment: 80 YOM brought in s/p fall- workup revealed left hip fracture and right acetabular fracture. Also found to have UTI. SCr 1.08, est CrCL ~74mL/min.  Goal of Therapy:  eradication of infection  Plan:  1. Ceftriaxone 1g IV q24h 2. Pharmacy to sign off as no dose adjustments needed 3. Recommend 10-14 days of therapy  [name] [name], PharmD, BCPS Clinical Pharmacist Pager: [phone] 05/31/77 10:38 PM

## 2014-05-31 NOTE — Progress Notes (Signed)
*  PRELIMINARY RESULTS* Echocardiogram 2D Echocardiogram has been performed.  Leavy Cella 05/31/2014, 11:01 AM

## 2014-05-31 NOTE — Progress Notes (Signed)
Upon assessment, pt disoriented to place, time, situation. Pt saying random sentances. Follows commands. Moves all extremities.  Also informed that UO remains low despite 2 fluid challenges. Pt also having frequent PVCs. Orders received. Will continue to monitor.

## 2014-05-31 NOTE — Progress Notes (Signed)
Orthopedic Tech Progress Note Patient Details:  [name] [name] [birth year] [id number] Patient bed not suitable to hold OHF. If patient is to have OHF bed must be switched.  Patient ID: [name] [name], male   DOB: 02-04-1927, 80 y.o.   MRN: [id number]   Fenton Foy 05/31/2014, 12:45 PM

## 2014-05-31 NOTE — Consult Note (Signed)
Name: [name] [name] MRN: [id number] DOB: 04-Sep-1926    ADMISSION DATE:  05/30/2014 CONSULTATION DATE:  11/232015  REFERRING MD :  Hal Hope  CHIEF COMPLAINT:  Hip fx  BRIEF PATIENT DESCRIPTION: 79 year old male presented to [facility] after mechanical fall c/o R shoulder and chest wall pain. In ED he was found to have hip fx. Scheduled for surgery 11/3. PCCM consulted for pulmonary clearance.   SIGNIFICANT EVENTS  None  STUDIES:  Bilateral hip fracture.  HISTORY OF PRESENT ILLNESS:  80 year old male with PMH as below, which includes ILD, (has seen [place] in office), AAA, and A fib. He suffered a mechanical fall at home 11/2 and presented to [facility] ED 11/2 c/o L hip and R shoulder pain. He was found to have displaced L femoral neck fracture. He was evaluated by orthopedics who would like to take him to OR 11/3. Primary team has requested Pre-op pulmonary clearance.   PAST MEDICAL HISTORY :   has a past medical history of Diabetes; Atrial fibrillation; Gynecomastia; RBBB; High cholesterol; Hiatal hernia; AAA (abdominal aortic aneurysm); Heart murmur; Neuropathic impotence; Neuropathy; Macular degeneration, wet; Pulmonary fibrosis; Liver disease; and Left displaced femoral neck fracture (05/30/2014).  has past surgical history that includes Colostomy (1960); Shoulder surgery (Right); and Nose surgery. Prior to Admission medications   Medication Sig Start Date End Date Taking? Authorizing Provider  antiseptic oral rinse (BIOTENE) LIQD 15 mLs by Mouth Rinse route as needed for dry mouth.   Yes Historical Provider, MD  docusate sodium (COLACE) 100 MG capsule Take 100 mg by mouth 2 (two) times daily as needed for mild constipation.   Yes Historical Provider, MD  finasteride (PROSCAR) 5 MG tablet Take 5 mg by mouth daily.   Yes Historical Provider, MD  glipiZIDE (GLUCOTROL XL) 10 MG 24 hr tablet Take 20 mg by mouth daily with breakfast.   Yes Historical Provider, MD  Glucosamine-Chondroit-Vit C-Mn (GLUCOSAMINE  1500 COMPLEX PO) Take 1,500 mg by mouth 2 (two) times daily.    Yes Historical Provider, MD  guaiFENesin (MUCINEX) 600 MG 12 hr tablet Take 600 mg by mouth daily as needed for to loosen phlegm.   Yes Historical Provider, MD  metFORMIN (GLUCOPHAGE-XR) 500 MG 24 hr tablet Take 1,000 mg by mouth 2 (two) times daily.    Yes Historical Provider, MD  Multiple Vitamins-Minerals (EYE VITAMINS PO) Take 1 tablet by mouth daily. SMOTHERS FORMULA   Yes Historical Provider, MD  naproxen sodium (ANAPROX) 220 MG tablet Take 440 mg by mouth daily.    Yes Historical Provider, MD  Omega-3 Fatty Acids (FISH OIL) 1200 MG CAPS Take 1,200 mg by mouth 2 (two) times daily.    Yes Historical Provider, MD  rosuvastatin (CRESTOR) 10 MG tablet Take 10 mg by mouth daily.   Yes Historical Provider, MD  sotalol (BETAPACE) 80 MG tablet Take 80 mg by mouth 2 (two) times daily.   Yes Historical Provider, MD   No Known Allergies  FAMILY HISTORY:  family history includes COPD in his brother; Rectal cancer in his father. SOCIAL HISTORY:  reports that he has been smoking Cigarettes.  He has a 31.5 pack-year smoking history. He does not have any smokeless tobacco history on file. He reports that he does not drink alcohol or use illicit drugs.  REVIEW OF SYSTEMS:   Bolds are positive  Constitutional: weight loss, gain, night sweats, Fevers, chills, fatigue .  HEENT: headaches, Sore throat, sneezing, nasal congestion, post nasal drip, Difficulty swallowing, Tooth/dental  problems, visual complaints visual changes, ear ache CV:  chest pain, radiates: ,Orthopnea, PND, swelling in lower extremities, dizziness, palpitations, syncope.  GI  heartburn, indigestion, abdominal pain, nausea, vomiting, diarrhea, change in bowel habits, loss of appetite, bloody stools.  Resp: cough, productive: , hemoptysis, dyspnea, chest pain, pleuritic.  Skin: rash or itching or icterus GU: dysuria, change in color of urine, urgency or frequency. flank pain,  hematuria  MS: joint pain or swelling. decreased range of motion  Psych: change in mood or affect. depression or anxiety.  Neuro: difficulty with speech, weakness, numbness, ataxia    SUBJECTIVE:   VITAL SIGNS: Temp:  [97.9 F (36.6 C)] 97.9 F (36.6 C) (11/02 1650) Pulse Rate:  [27-83] 37 (11/03 0300) Resp:  [15-28] 22 (11/03 0300) BP: (106-138)/(44-88) 125/44 mmHg (11/03 0300) SpO2:  [79 %-100 %] 100 % (11/03 0300) Weight:  [82.555 kg (182 lb)] 82.555 kg (182 lb) (11/02 1650)  PHYSICAL EXAMINATION: General:  Chronically ill appearing male, in painful distress and mildly confused. Neuro:  Awake but confused, moving all ext to command. HEENT:  [place]/AT, PERRL, EOM-I and DMM. Cardiovascular:  RRR, Nl S1/S2, -M/R/G. Lungs:  Diffuse wheezing and bibasilar crackles. Abdomen:  Soft, NT, ND and +BS. Musculoskeletal:  -Edema and diffuse tenderness. Skin:  Thin but intact.   Recent Labs Lab 05/30/14 1701  NA 139  K 4.6  CL 101  CO2 24  BUN 18  CREATININE 0.94  GLUCOSE 227*    Recent Labs Lab 05/30/14 1701  HGB 12.4*  HCT 37.9*  WBC 9.6  PLT 156   Dg Thoracic Spine 2 View  05/30/2014   CLINICAL DATA:  Fall, mid back pain  EXAM: THORACIC SPINE - 2 VIEW  COMPARISON:  None.  FINDINGS: Normal thoracic kyphosis.  No evidence of fracture dislocation. Vertebral body heights are maintained.  Moderate multilevel degenerative changes.  Vascular calcifications.  Visualized lungs are clear.  IMPRESSION: No fracture or dislocation is seen.  Moderate multilevel degenerative changes.   Electronically Signed   By: Julian Hy M.D.   On: 05/30/2014 19:23   Dg Lumbar Spine Complete  05/30/2014   CLINICAL DATA:  Fall, mid/lower back pain  EXAM: LUMBAR SPINE - COMPLETE 4+ VIEW  COMPARISON:  CT abdomen dated 08/27/2013  FINDINGS: Five lumbar type vertebral bodies.  Normal lumbar lordosis.  No evidence of fracture or dislocation. Vertebral body heights are maintained.  Grade 1  anterolisthesis of L4 on L5.  Moderate multilevel degenerative changes.  Visualized bony pelvis appears intact.  Vascular calcifications with known infrarenal abdominal aortic aneurysm.a  IMPRESSION: No fracture or dislocation is seen.  Moderate degenerative changes.  Grade 1 anterolisthesis of L4 on L5.   Electronically Signed   By: Julian Hy M.D.   On: 05/30/2014 20:01   Dg Shoulder Right  05/30/2014   CLINICAL DATA:  Fall, right shoulder pain  EXAM: RIGHT SHOULDER - 2+ VIEW  COMPARISON:  None.  FINDINGS: No fracture or dislocation is seen.  Moderate degenerative changes of the glenohumeral joint and acromioclavicular joint  The visualized soft tissues are unremarkable.  Visualized right lung is clear.  IMPRESSION: No fracture or dislocation is seen.  Moderate degenerative changes.   Electronically Signed   By: Julian Hy M.D.   On: 05/30/2014 19:22   Dg Hip Complete Left  05/30/2014   CLINICAL DATA:  Left hip pain after falling at home today at 1500 hr.  EXAM: LEFT HIP - COMPLETE 2+ VIEW  COMPARISON:  Portable  pelvis obtained earlier today.  FINDINGS: Left femoral neck fracture with mild valgus angulation. Atheromatous arterial calcifications.  IMPRESSION: Previously noted mildly displaced left femoral neck fracture with mild valgus angulation.   Electronically Signed   By: Enrique Sack M.D.   On: 05/30/2014 19:21   Ct Head Wo Contrast  05/30/2014   CLINICAL DATA:  Right neck pain after falling today and hitting his shoulder against a door jamb.  EXAM: CT HEAD WITHOUT CONTRAST  CT CERVICAL SPINE WITHOUT CONTRAST  TECHNIQUE: Multidetector CT imaging of the head and cervical spine was performed following the standard protocol without intravenous contrast. Multiplanar CT image reconstructions of the cervical spine were also generated.  COMPARISON:  10/01/2013.  FINDINGS: CT HEAD FINDINGS  Diffusely enlarged ventricles and subarachnoid spaces. Patchy white matter low density in both cerebral  hemispheres. No skull fracture, intracranial hemorrhage or paranasal sinus air-fluid levels. Dense arterial calcifications at the skull base.  CT CERVICAL SPINE FINDINGS  Extensive multilevel cervical spine degenerative changes. No prevertebral soft tissue swelling, fractures or subluxations. Bilateral vertebral and carotid artery atheromatous calcifications. Bullous changes at both lung apices.  IMPRESSION: 1. No skull fracture or intracranial hemorrhage. 2. No cervical spine fracture or subluxation. 3. Stable mid atrophy and chronic small vessel white matter ischemic changes. 4. Stable marked cervical spine degenerative changes. 5. COPD.   Electronically Signed   By: Enrique Sack M.D.   On: 05/30/2014 19:07   Ct Chest W Contrast  05/30/2014   CLINICAL DATA:  [place] today. Complaining of right shoulder and left hip pain.  EXAM: CT CHEST, ABDOMEN, AND PELVIS WITH CONTRAST  TECHNIQUE: Multidetector CT imaging of the chest, abdomen and pelvis was performed following the standard protocol during bolus administration of intravenous contrast.  CONTRAST:  176mL OMNIPAQUE IOHEXOL 300 MG/ML  SOLN  COMPARISON:  Chest CT, 08/30/2013.  FINDINGS: CT CHEST FINDINGS  Heart is mildly enlarged. There are dense coronary artery and mitral valve annular calcifications. Aorta is normal in caliber. There is partly calcified mild plaque along the thoracic aorta and at the origin of the left subclavian artery. No significant stenosis.  Right left pulmonary arteries are dilated to 3 cm, stable.  Borderline enlarged right peritracheal lymph node measures 1 cm short axis, stable. No mediastinal masses. No hilar masses or adenopathy.  Lungs show heterogeneous coarse peripheral reticular opacities consistent with interstitial fibrosis, with a pattern suggesting UIP. Focal opacity noted in the left lower lobe on the prior exam measuring 15 mm x 7 mm, is without significant change. No acute lung consolidation or evidence of edema. No pleural  effusion or pneumothorax.  CT ABDOMEN AND PELVIS FINDINGS  Liver shows morphologic changes consistent with cirrhosis. No liver mass or focal lesion. No contusion or laceration.  Mean is prominent measuring 12.4 cm in greatest dimension. No splenic mass or focal lesion.  Gallbladder is unremarkable. No bile duct dilation. No pancreatic masses or inflammation. No adrenal masses.  Mild bilateral renal cortical thinning. There is a peripherally calcified hypo attenuating, 3.1 cm mass protruding from the inferior margin of the left kidney, not imaged on the prior chest CT. This is likely a mildly complicated cyst. There is a low attenuation lesion in the upper pole left kidney, most likely a simple cyst. No other renal masses or lesions. No hydronephrosis. Normal ureters. Bladder is unremarkable.  There is an infrarenal abdominal aortic aneurysm measuring 3.7 cm in greatest anterior-posterior dimension. No evidence of rupture. Atherosclerotic changes noted along the abdominal aorta  and its branch vessels.  No pathologically enlarged lymph nodes. No abnormal fluid collections. Specifically no evidence of hemo peritoneum.  No bowel wall thickening to suggest a bowel hematoma. No bowel inflammatory changes. No mesenteric inflammation or contusion/hematoma.  MUSCULOSKELETAL:  There fractures of the right acetabulum. Fracture extends across the posterior column, extending across the medial acetabulum to the anterior,. Fracture is mildly distracted by 7 mm. There is mild fracture comminution with small fracture fragments noted between the major fracture components, at least 1 within the hip joint.  There is a fracture of the left femoral neck. This is subcapital, oblique and non comminuted. Fracture is displaced with the distal fracture component migrating superiorly by 17 mm. There is also mild varus angulation as well as significant apex anterior angulation.  No other pelvic or proximal femur fractures.  There are no other  fractures.  IMPRESSION: 1. Fractures of the right acetabulum involving the anterior posterior columns. 2. Fracture of the left femoral neck, mildly displaced and angulated. 3. No other fractures. 4. No other acute findings in the chest, abdomen or pelvis. No other evidence of acute injury. 5. Chronic findings include cardiomegaly, coronary artery calcifications and changes consistent with interstitial fibrosis. Pulmonary artery dilation suggest pulmonary artery hypertension. Below the diaphragm, there changes of cirrhosis with borderline splenomegaly. Probable left renal cysts. 6. 3.7 cm infrarenal abdominal aortic aneurysm. No evidence of rupture.   Electronically Signed   By: Lajean Manes M.D.   On: 05/30/2014 22:06   Ct Cervical Spine Wo Contrast  05/30/2014   CLINICAL DATA:  Right neck pain after falling today and hitting his shoulder against a door jamb.  EXAM: CT HEAD WITHOUT CONTRAST  CT CERVICAL SPINE WITHOUT CONTRAST  TECHNIQUE: Multidetector CT imaging of the head and cervical spine was performed following the standard protocol without intravenous contrast. Multiplanar CT image reconstructions of the cervical spine were also generated.  COMPARISON:  10/01/2013.  FINDINGS: CT HEAD FINDINGS  Diffusely enlarged ventricles and subarachnoid spaces. Patchy white matter low density in both cerebral hemispheres. No skull fracture, intracranial hemorrhage or paranasal sinus air-fluid levels. Dense arterial calcifications at the skull base.  CT CERVICAL SPINE FINDINGS  Extensive multilevel cervical spine degenerative changes. No prevertebral soft tissue swelling, fractures or subluxations. Bilateral vertebral and carotid artery atheromatous calcifications. Bullous changes at both lung apices.  IMPRESSION: 1. No skull fracture or intracranial hemorrhage. 2. No cervical spine fracture or subluxation. 3. Stable mid atrophy and chronic small vessel white matter ischemic changes. 4. Stable marked cervical spine  degenerative changes. 5. COPD.   Electronically Signed   By: Enrique Sack M.D.   On: 05/30/2014 19:07   Ct Abdomen Pelvis W Contrast  05/30/2014   CLINICAL DATA:  [place] today. Complaining of right shoulder and left hip pain.  EXAM: CT CHEST, ABDOMEN, AND PELVIS WITH CONTRAST  TECHNIQUE: Multidetector CT imaging of the chest, abdomen and pelvis was performed following the standard protocol during bolus administration of intravenous contrast.  CONTRAST:  138mL OMNIPAQUE IOHEXOL 300 MG/ML  SOLN  COMPARISON:  Chest CT, 08/30/2013.  FINDINGS: CT CHEST FINDINGS  Heart is mildly enlarged. There are dense coronary artery and mitral valve annular calcifications. Aorta is normal in caliber. There is partly calcified mild plaque along the thoracic aorta and at the origin of the left subclavian artery. No significant stenosis.  Right left pulmonary arteries are dilated to 3 cm, stable.  Borderline enlarged right peritracheal lymph node measures 1 cm short axis, stable. No  mediastinal masses. No hilar masses or adenopathy.  Lungs show heterogeneous coarse peripheral reticular opacities consistent with interstitial fibrosis, with a pattern suggesting UIP. Focal opacity noted in the left lower lobe on the prior exam measuring 15 mm x 7 mm, is without significant change. No acute lung consolidation or evidence of edema. No pleural effusion or pneumothorax.  CT ABDOMEN AND PELVIS FINDINGS  Liver shows morphologic changes consistent with cirrhosis. No liver mass or focal lesion. No contusion or laceration.  Mean is prominent measuring 12.4 cm in greatest dimension. No splenic mass or focal lesion.  Gallbladder is unremarkable. No bile duct dilation. No pancreatic masses or inflammation. No adrenal masses.  Mild bilateral renal cortical thinning. There is a peripherally calcified hypo attenuating, 3.1 cm mass protruding from the inferior margin of the left kidney, not imaged on the prior chest CT. This is likely a mildly complicated  cyst. There is a low attenuation lesion in the upper pole left kidney, most likely a simple cyst. No other renal masses or lesions. No hydronephrosis. Normal ureters. Bladder is unremarkable.  There is an infrarenal abdominal aortic aneurysm measuring 3.7 cm in greatest anterior-posterior dimension. No evidence of rupture. Atherosclerotic changes noted along the abdominal aorta and its branch vessels.  No pathologically enlarged lymph nodes. No abnormal fluid collections. Specifically no evidence of hemo peritoneum.  No bowel wall thickening to suggest a bowel hematoma. No bowel inflammatory changes. No mesenteric inflammation or contusion/hematoma.  MUSCULOSKELETAL:  There fractures of the right acetabulum. Fracture extends across the posterior column, extending across the medial acetabulum to the anterior,. Fracture is mildly distracted by 7 mm. There is mild fracture comminution with small fracture fragments noted between the major fracture components, at least 1 within the hip joint.  There is a fracture of the left femoral neck. This is subcapital, oblique and non comminuted. Fracture is displaced with the distal fracture component migrating superiorly by 17 mm. There is also mild varus angulation as well as significant apex anterior angulation.  No other pelvic or proximal femur fractures.  There are no other fractures.  IMPRESSION: 1. Fractures of the right acetabulum involving the anterior posterior columns. 2. Fracture of the left femoral neck, mildly displaced and angulated. 3. No other fractures. 4. No other acute findings in the chest, abdomen or pelvis. No other evidence of acute injury. 5. Chronic findings include cardiomegaly, coronary artery calcifications and changes consistent with interstitial fibrosis. Pulmonary artery dilation suggest pulmonary artery hypertension. Below the diaphragm, there changes of cirrhosis with borderline splenomegaly. Probable left renal cysts. 6. 3.7 cm infrarenal  abdominal aortic aneurysm. No evidence of rupture.   Electronically Signed   By: Lajean Manes M.D.   On: 05/30/2014 22:06   Dg Pelvis Portable  05/30/2014   CLINICAL DATA:  [place] today at 3 o'clock. Tripped over a door way. Left hip pain.  EXAM: PORTABLE PELVIS 1-2 VIEWS PORTABLE ONE-VIEW CHEST X-RAY.  COMPARISON:  HIP RADIOGRAPHS 10/01/2013 AND CHEST X-RAY 08/26/2013.  FINDINGS: CHEST X-RAY:  The heart is mildly enlarged but stable. There is tortuosity and calcification of the thoracic aorta. Chronic lung changes with pulmonary fibrosis. No definite acute overlying pulmonary process. The bony thorax is grossly intact.  Pelvis:  There is a displaced left femoral neck fracture. The right hip is intact. Moderate degenerative changes bilaterally, right greater than left. The pubic symphysis and SI joints are intact. No pelvic fractures. Extensive vascular calcifications are noted.  IMPRESSION: 1. Displaced left femoral neck fracture. 2.  No acute cardiopulmonary findings. Chronic lung changes/pulmonary fibrosis.   Electronically Signed   By: Kalman Jewels M.D.   On: 05/30/2014 17:36   Dg Chest Port 1 View  05/30/2014   CLINICAL DATA:  80 year old male who fell at 1500 hr today tripping over door way. Acute pain. Initial encounter.  EXAM: PORTABLE CHEST - 1 VIEW  COMPARISON:  Chest CT 08/30/2013 and earlier.  FINDINGS: Portable AP semi upright view at 1716 hr. Lower lung volumes. Stable cardiomegaly and mediastinal contours. Peripheral increased opacity greater in the left lung is stable. No pneumothorax, pulmonary edema, or pleural effusion identified. No acute pulmonary opacity identified. Calcified atherosclerosis of the aorta. Visualized tracheal air column is within normal limits. No acute osseous abnormality identified.  IMPRESSION: Cardiomegaly and chronic lung disease. No superimposed acute findings are identified.   Electronically Signed   By: Lars Pinks M.D.   On: 05/30/2014 17:35    Georgann Housekeeper,  ACNP [place] [facility] Pager [phone] or [phone]  ASSESSMENT / PLAN:  80 year old male active smoker with the diagnosis of COPD who presents to the hospital with bilateral hip fracture in sig amount of pain.  Patient is a full DNR and family was made aware that there is a chance that patient would not come off the vent in which case the family will want him as full comfort care.  Patient is a high risk for surgery from a pulmonary standpoint. Plan: - Duonebs QID. - Albuterol as needed. - Titrate O2 for sats. - Minimize sedation as able given AMS. - Will f/u post op. - Full DNR.  Rush Farmer, M.D. [facility] Pulmonary/Critical Care Medicine. Pager: [phone]. After hours pager: [phone].

## 2014-05-31 NOTE — Progress Notes (Signed)
Speech Language Pathology Treatment:    Patient Details Name: Fahim Kats MRN: [id number] DOB: 05-15-1927 Today's Date: 05/31/2014 Time: 1215-    Received order for swallow assessment.  Daughter unaware of need for eval and states "dad has not had problems swallowing" and Dr. Allyson Sabal paged and ordered eval due to pt.'s hypoxia.  Daughter politely declined assessment.     Orbie Pyo [place].[employer] [phone]

## 2014-05-31 NOTE — Progress Notes (Signed)
Inpatient Diabetes Program Recommendations  AACE/ADA: New Consensus Statement on Inpatient Glycemic Control (2013)  Target Ranges:  Prepandial:   less than 140 mg/dL      Peak postprandial:   less than 180 mg/dL (1-2 hours)      Critically ill patients:  140 - 180 mg/dL   Inpatient Diabetes Program Recommendations Oral Agents: Discontinue Glipizide while hospitalized Thank you  Raoul Pitch BSN, RN,CDE Inpatient Diabetes Coordinator [phone] (team pager)

## 2014-05-31 NOTE — Consult Note (Signed)
[facility] Consultation  Reason for Consult: Right acetabular fracture Referring Physician: Carter Kitten, MD  [name] [name] is an 80 y.o. male.  HPI: Ground level fall. Displaced left femoral neck fracture and right transverse acetabular fracture with medial displacement of quadrilateral plate.    Past Medical History  Diagnosis Date  . Diabetes   . Atrial fibrillation   . Gynecomastia   . RBBB   . High cholesterol   . Hiatal hernia   . AAA (abdominal aortic aneurysm)   . Heart murmur   . Neuropathic impotence   . Neuropathy   . Macular degeneration, wet   . Pulmonary fibrosis   . Liver disease   . Left displaced femoral neck fracture 05/30/2014    Past Surgical History  Procedure Laterality Date  . Colostomy  1960  . Shoulder surgery Right   . Nose surgery      multiple times--broken nose/lacerations    Family History  Problem Relation Age of Onset  . Rectal cancer Father   . COPD Brother     Social History:  reports that he has been smoking Cigarettes.  He has a 31.5 pack-year smoking history. He does not have any smokeless tobacco history on file. He reports that he does not drink alcohol or use illicit drugs.  Allergies: No Known Allergies  Medications: I have reviewed the patient's current medications.  Results for orders placed or performed during the hospital encounter of 05/30/14 (from the past 48 hour(s))  CBC WITH DIFFERENTIAL     Status: Abnormal   Collection Time: 05/30/14  5:01 PM  Result Value Ref Range   WBC 9.6 4.0 - 10.5 K/uL   RBC 4.24 4.22 - 5.81 MIL/uL   Hemoglobin 12.4 (L) 13.0 - 17.0 g/dL   HCT 37.9 (L) 39.0 - 52.0 %   MCV 89.4 78.0 - 100.0 fL   MCH 29.2 26.0 - 34.0 pg   MCHC 32.7 30.0 - 36.0 g/dL   RDW 13.9 11.5 - 15.5 %   Platelets 156 150 - 400 K/uL   Neutrophils Relative % 82 (H) 43 - 77 %   Neutro Abs 7.9 (H) 1.7 - 7.7 K/uL   Lymphocytes Relative 12 12 - 46 %   Lymphs Abs 1.1 0.7 - 4.0 K/uL   Monocytes Relative 5  3 - 12 %   Monocytes Absolute 0.5 0.1 - 1.0 K/uL   Eosinophils Relative 1 0 - 5 %   Eosinophils Absolute 0.1 0.0 - 0.7 K/uL   Basophils Relative 0 0 - 1 %   Basophils Absolute 0.0 0.0 - 0.1 K/uL  Basic metabolic panel     Status: Abnormal   Collection Time: 05/30/14  5:01 PM  Result Value Ref Range   Sodium 139 137 - 147 mEq/L   Potassium 4.6 3.7 - 5.3 mEq/L   Chloride 101 96 - 112 mEq/L   CO2 24 19 - 32 mEq/L   Glucose, Bld 227 (H) 70 - 99 mg/dL   BUN 18 6 - 23 mg/dL   Creatinine, Ser 0.94 0.50 - 1.35 mg/dL   Calcium 8.8 8.4 - 10.5 mg/dL   GFR calc non Af Amer 74 (L) >90 mL/min   GFR calc Af Amer 86 (L) >90 mL/min    Comment: (NOTE) The eGFR has been calculated using the CKD EPI equation. This calculation has not been validated in all clinical situations. eGFR's persistently <90 mL/min signify possible Chronic Kidney Disease.    Anion gap 14 5 -  15  Troponin I     Status: None   Collection Time: 05/30/14  5:01 PM  Result Value Ref Range   Troponin I <0.30 <0.30 ng/mL    Comment:        Due to the release kinetics of cTnI, a negative result within the first hours of the onset of symptoms does not rule out myocardial infarction with certainty. If myocardial infarction is still suspected, repeat the test at appropriate intervals.   Surgical pcr screen     Status: Abnormal   Collection Time: 05/31/14  1:33 AM  Result Value Ref Range   MRSA, PCR NEGATIVE NEGATIVE   Staphylococcus aureus POSITIVE (A) NEGATIVE    Comment:        The Xpert SA Assay (FDA approved for NASAL specimens in patients over 68 years of age), is one component of a comprehensive surveillance program.  Test performance has been validated by [employer] for patients greater than or equal to 22 year old. It is not intended to diagnose infection nor to guide or monitor treatment. RESULT CALLED TO, READ BACK BY AND VERIFIED WITH: A WEATHERFORD,RN 05/31/14 0400 BY RHOLMES   Urinalysis, Routine w  reflex microscopic     Status: Abnormal   Collection Time: 05/31/14  2:06 AM  Result Value Ref Range   Color, Urine YELLOW YELLOW   APPearance CLEAR CLEAR   Specific Gravity, Urine 1.030 1.005 - 1.030   pH 5.0 5.0 - 8.0   Glucose, UA NEGATIVE NEGATIVE mg/dL   Hgb urine dipstick NEGATIVE NEGATIVE   Bilirubin Urine NEGATIVE NEGATIVE   Ketones, ur 15 (A) NEGATIVE mg/dL   Protein, ur 30 (A) NEGATIVE mg/dL   Urobilinogen, UA 0.2 0.0 - 1.0 mg/dL   Nitrite NEGATIVE NEGATIVE   Leukocytes, UA NEGATIVE NEGATIVE  Urine microscopic-add on     Status: Abnormal   Collection Time: 05/31/14  2:06 AM  Result Value Ref Range   Squamous Epithelial / LPF RARE RARE   RBC / HPF 3-6 <3 RBC/hpf   Bacteria, UA RARE RARE   Casts HYALINE CASTS (A) NEGATIVE  Comprehensive metabolic panel     Status: Abnormal   Collection Time: 05/31/14  3:15 AM  Result Value Ref Range   Sodium 137 137 - 147 mEq/L   Potassium 5.1 3.7 - 5.3 mEq/L   Chloride 100 96 - 112 mEq/L   CO2 27 19 - 32 mEq/L   Glucose, Bld 221 (H) 70 - 99 mg/dL   BUN 19 6 - 23 mg/dL   Creatinine, Ser 1.08 0.50 - 1.35 mg/dL   Calcium 8.8 8.4 - 10.5 mg/dL   Total Protein 6.9 6.0 - 8.3 g/dL   Albumin 3.3 (L) 3.5 - 5.2 g/dL   AST 17 0 - 37 U/L   ALT 12 0 - 53 U/L   Alkaline Phosphatase 94 39 - 117 U/L   Total Bilirubin 0.4 0.3 - 1.2 mg/dL   GFR calc non Af Amer 61 (L) >90 mL/min   GFR calc Af Amer 70 (L) >90 mL/min    Comment: (NOTE) The eGFR has been calculated using the CKD EPI equation. This calculation has not been validated in all clinical situations. eGFR's persistently <90 mL/min signify possible Chronic Kidney Disease.    Anion gap 10 5 - 15  CBC with Differential     Status: Abnormal   Collection Time: 05/31/14  3:15 AM  Result Value Ref Range   WBC 9.9 4.0 - 10.5 K/uL  RBC 4.18 (L) 4.22 - 5.81 MIL/uL   Hemoglobin 12.1 (L) 13.0 - 17.0 g/dL   HCT 37.4 (L) 39.0 - 52.0 %   MCV 89.5 78.0 - 100.0 fL   MCH 28.9 26.0 - 34.0 pg   MCHC  32.4 30.0 - 36.0 g/dL   RDW 14.1 11.5 - 15.5 %   Platelets 151 150 - 400 K/uL   Neutrophils Relative % 86 (H) 43 - 77 %   Neutro Abs 8.5 (H) 1.7 - 7.7 K/uL   Lymphocytes Relative 8 (L) 12 - 46 %   Lymphs Abs 0.8 0.7 - 4.0 K/uL   Monocytes Relative 5 3 - 12 %   Monocytes Absolute 0.5 0.1 - 1.0 K/uL   Eosinophils Relative 1 0 - 5 %   Eosinophils Absolute 0.1 0.0 - 0.7 K/uL   Basophils Relative 0 0 - 1 %   Basophils Absolute 0.0 0.0 - 0.1 K/uL  Protime-INR     Status: None   Collection Time: 05/31/14  3:15 AM  Result Value Ref Range   Prothrombin Time 14.0 11.6 - 15.2 seconds   INR 1.06 0.00 - 1.49  Type and screen     Status: None   Collection Time: 05/31/14  3:15 AM  Result Value Ref Range   ABO/RH(D) A POS    Antibody Screen NEG    Sample Expiration 06/03/2014   Pro b natriuretic peptide     Status: Abnormal   Collection Time: 05/31/14  3:15 AM  Result Value Ref Range   Pro B Natriuretic peptide (BNP) 7931.0 (H) 0 - 450 pg/mL  Troponin I     Status: Abnormal   Collection Time: 05/31/14  3:15 AM  Result Value Ref Range   Troponin I 0.87 (HH) <0.30 ng/mL    Comment:        Due to the release kinetics of cTnI, a negative result within the first hours of the onset of symptoms does not rule out myocardial infarction with certainty. If myocardial infarction is still suspected, repeat the test at appropriate intervals. CRITICAL RESULT CALLED TO, READ BACK BY AND VERIFIED WITH: ANINON M,RN 05/31/14 0437 WAYK   Glucose, capillary     Status: Abnormal   Collection Time: 05/31/14  5:13 AM  Result Value Ref Range   Glucose-Capillary 190 (H) 70 - 99 mg/dL   Comment 1 Capillary Sample   Troponin I (q 6hr x 3)     Status: Abnormal   Collection Time: 05/31/14  6:15 AM  Result Value Ref Range   Troponin I 0.75 (HH) <0.30 ng/mL    Comment:        Due to the release kinetics of cTnI, a negative result within the first hours of the onset of symptoms does not rule out myocardial  infarction with certainty. If myocardial infarction is still suspected, repeat the test at appropriate intervals. CRITICAL VALUE NOTED.  VALUE IS CONSISTENT WITH PREVIOUSLY REPORTED AND CALLED VALUE.     Dg Thoracic Spine 2 View  05/30/2014   CLINICAL DATA:  Fall, mid back pain  EXAM: THORACIC SPINE - 2 VIEW  COMPARISON:  None.  FINDINGS: Normal thoracic kyphosis.  No evidence of fracture dislocation. Vertebral body heights are maintained.  Moderate multilevel degenerative changes.  Vascular calcifications.  Visualized lungs are clear.  IMPRESSION: No fracture or dislocation is seen.  Moderate multilevel degenerative changes.   Electronically Signed   By: Julian Hy M.D.   On: 05/30/2014 19:23   Dg Lumbar  Spine Complete  05/30/2014   CLINICAL DATA:  Fall, mid/lower back pain  EXAM: LUMBAR SPINE - COMPLETE 4+ VIEW  COMPARISON:  CT abdomen dated 08/27/2013  FINDINGS: Five lumbar type vertebral bodies.  Normal lumbar lordosis.  No evidence of fracture or dislocation. Vertebral body heights are maintained.  Grade 1 anterolisthesis of L4 on L5.  Moderate multilevel degenerative changes.  Visualized bony pelvis appears intact.  Vascular calcifications with known infrarenal abdominal aortic aneurysm.a  IMPRESSION: No fracture or dislocation is seen.  Moderate degenerative changes.  Grade 1 anterolisthesis of L4 on L5.   Electronically Signed   By: Julian Hy M.D.   On: 05/30/2014 20:01   Dg Shoulder Right  05/30/2014   CLINICAL DATA:  Fall, right shoulder pain  EXAM: RIGHT SHOULDER - 2+ VIEW  COMPARISON:  None.  FINDINGS: No fracture or dislocation is seen.  Moderate degenerative changes of the glenohumeral joint and acromioclavicular joint  The visualized soft tissues are unremarkable.  Visualized right lung is clear.  IMPRESSION: No fracture or dislocation is seen.  Moderate degenerative changes.   Electronically Signed   By: Julian Hy M.D.   On: 05/30/2014 19:22   Dg Hip Complete  Left  05/30/2014   CLINICAL DATA:  Left hip pain after falling at home today at 1500 hr.  EXAM: LEFT HIP - COMPLETE 2+ VIEW  COMPARISON:  Portable pelvis obtained earlier today.  FINDINGS: Left femoral neck fracture with mild valgus angulation. Atheromatous arterial calcifications.  IMPRESSION: Previously noted mildly displaced left femoral neck fracture with mild valgus angulation.   Electronically Signed   By: Enrique Sack M.D.   On: 05/30/2014 19:21   Ct Head Wo Contrast  05/30/2014   CLINICAL DATA:  Right neck pain after falling today and hitting his shoulder against a door jamb.  EXAM: CT HEAD WITHOUT CONTRAST  CT CERVICAL SPINE WITHOUT CONTRAST  TECHNIQUE: Multidetector CT imaging of the head and cervical spine was performed following the standard protocol without intravenous contrast. Multiplanar CT image reconstructions of the cervical spine were also generated.  COMPARISON:  10/01/2013.  FINDINGS: CT HEAD FINDINGS  Diffusely enlarged ventricles and subarachnoid spaces. Patchy white matter low density in both cerebral hemispheres. No skull fracture, intracranial hemorrhage or paranasal sinus air-fluid levels. Dense arterial calcifications at the skull base.  CT CERVICAL SPINE FINDINGS  Extensive multilevel cervical spine degenerative changes. No prevertebral soft tissue swelling, fractures or subluxations. Bilateral vertebral and carotid artery atheromatous calcifications. Bullous changes at both lung apices.  IMPRESSION: 1. No skull fracture or intracranial hemorrhage. 2. No cervical spine fracture or subluxation. 3. Stable mid atrophy and chronic small vessel white matter ischemic changes. 4. Stable marked cervical spine degenerative changes. 5. COPD.   Electronically Signed   By: Enrique Sack M.D.   On: 05/30/2014 19:07   Ct Chest W Contrast  05/30/2014   CLINICAL DATA:  [place] today. Complaining of right shoulder and left hip pain.  EXAM: CT CHEST, ABDOMEN, AND PELVIS WITH CONTRAST  TECHNIQUE:  Multidetector CT imaging of the chest, abdomen and pelvis was performed following the standard protocol during bolus administration of intravenous contrast.  CONTRAST:  197m OMNIPAQUE IOHEXOL 300 MG/ML  SOLN  COMPARISON:  Chest CT, 08/30/2013.  FINDINGS: CT CHEST FINDINGS  Heart is mildly enlarged. There are dense coronary artery and mitral valve annular calcifications. Aorta is normal in caliber. There is partly calcified mild plaque along the thoracic aorta and at the origin of the left subclavian artery.  No significant stenosis.  Right left pulmonary arteries are dilated to 3 cm, stable.  Borderline enlarged right peritracheal lymph node measures 1 cm short axis, stable. No mediastinal masses. No hilar masses or adenopathy.  Lungs show heterogeneous coarse peripheral reticular opacities consistent with interstitial fibrosis, with a pattern suggesting UIP. Focal opacity noted in the left lower lobe on the prior exam measuring 15 mm x 7 mm, is without significant change. No acute lung consolidation or evidence of edema. No pleural effusion or pneumothorax.  CT ABDOMEN AND PELVIS FINDINGS  Liver shows morphologic changes consistent with cirrhosis. No liver mass or focal lesion. No contusion or laceration.  Mean is prominent measuring 12.4 cm in greatest dimension. No splenic mass or focal lesion.  Gallbladder is unremarkable. No bile duct dilation. No pancreatic masses or inflammation. No adrenal masses.  Mild bilateral renal cortical thinning. There is a peripherally calcified hypo attenuating, 3.1 cm mass protruding from the inferior margin of the left kidney, not imaged on the prior chest CT. This is likely a mildly complicated cyst. There is a low attenuation lesion in the upper pole left kidney, most likely a simple cyst. No other renal masses or lesions. No hydronephrosis. Normal ureters. Bladder is unremarkable.  There is an infrarenal abdominal aortic aneurysm measuring 3.7 cm in greatest anterior-posterior  dimension. No evidence of rupture. Atherosclerotic changes noted along the abdominal aorta and its branch vessels.  No pathologically enlarged lymph nodes. No abnormal fluid collections. Specifically no evidence of hemo peritoneum.  No bowel wall thickening to suggest a bowel hematoma. No bowel inflammatory changes. No mesenteric inflammation or contusion/hematoma.  MUSCULOSKELETAL:  There fractures of the right acetabulum. Fracture extends across the posterior column, extending across the medial acetabulum to the anterior,. Fracture is mildly distracted by 7 mm. There is mild fracture comminution with small fracture fragments noted between the major fracture components, at least 1 within the hip joint.  There is a fracture of the left femoral neck. This is subcapital, oblique and non comminuted. Fracture is displaced with the distal fracture component migrating superiorly by 17 mm. There is also mild varus angulation as well as significant apex anterior angulation.  No other pelvic or proximal femur fractures.  There are no other fractures.  IMPRESSION: 1. Fractures of the right acetabulum involving the anterior posterior columns. 2. Fracture of the left femoral neck, mildly displaced and angulated. 3. No other fractures. 4. No other acute findings in the chest, abdomen or pelvis. No other evidence of acute injury. 5. Chronic findings include cardiomegaly, coronary artery calcifications and changes consistent with interstitial fibrosis. Pulmonary artery dilation suggest pulmonary artery hypertension. Below the diaphragm, there changes of cirrhosis with borderline splenomegaly. Probable left renal cysts. 6. 3.7 cm infrarenal abdominal aortic aneurysm. No evidence of rupture.   Electronically Signed   By: Lajean Manes M.D.   On: 05/30/2014 22:06   Ct Cervical Spine Wo Contrast  05/30/2014   CLINICAL DATA:  Right neck pain after falling today and hitting his shoulder against a door jamb.  EXAM: CT HEAD WITHOUT  CONTRAST  CT CERVICAL SPINE WITHOUT CONTRAST  TECHNIQUE: Multidetector CT imaging of the head and cervical spine was performed following the standard protocol without intravenous contrast. Multiplanar CT image reconstructions of the cervical spine were also generated.  COMPARISON:  10/01/2013.  FINDINGS: CT HEAD FINDINGS  Diffusely enlarged ventricles and subarachnoid spaces. Patchy white matter low density in both cerebral hemispheres. No skull fracture, intracranial hemorrhage or paranasal sinus air-fluid  levels. Dense arterial calcifications at the skull base.  CT CERVICAL SPINE FINDINGS  Extensive multilevel cervical spine degenerative changes. No prevertebral soft tissue swelling, fractures or subluxations. Bilateral vertebral and carotid artery atheromatous calcifications. Bullous changes at both lung apices.  IMPRESSION: 1. No skull fracture or intracranial hemorrhage. 2. No cervical spine fracture or subluxation. 3. Stable mid atrophy and chronic small vessel white matter ischemic changes. 4. Stable marked cervical spine degenerative changes. 5. COPD.   Electronically Signed   By: Enrique Sack M.D.   On: 05/30/2014 19:07   Ct Abdomen Pelvis W Contrast  05/30/2014   CLINICAL DATA:  [place] today. Complaining of right shoulder and left hip pain.  EXAM: CT CHEST, ABDOMEN, AND PELVIS WITH CONTRAST  TECHNIQUE: Multidetector CT imaging of the chest, abdomen and pelvis was performed following the standard protocol during bolus administration of intravenous contrast.  CONTRAST:  151mL OMNIPAQUE IOHEXOL 300 MG/ML  SOLN  COMPARISON:  Chest CT, 08/30/2013.  FINDINGS: CT CHEST FINDINGS  Heart is mildly enlarged. There are dense coronary artery and mitral valve annular calcifications. Aorta is normal in caliber. There is partly calcified mild plaque along the thoracic aorta and at the origin of the left subclavian artery. No significant stenosis.  Right left pulmonary arteries are dilated to 3 cm, stable.  Borderline  enlarged right peritracheal lymph node measures 1 cm short axis, stable. No mediastinal masses. No hilar masses or adenopathy.  Lungs show heterogeneous coarse peripheral reticular opacities consistent with interstitial fibrosis, with a pattern suggesting UIP. Focal opacity noted in the left lower lobe on the prior exam measuring 15 mm x 7 mm, is without significant change. No acute lung consolidation or evidence of edema. No pleural effusion or pneumothorax.  CT ABDOMEN AND PELVIS FINDINGS  Liver shows morphologic changes consistent with cirrhosis. No liver mass or focal lesion. No contusion or laceration.  Mean is prominent measuring 12.4 cm in greatest dimension. No splenic mass or focal lesion.  Gallbladder is unremarkable. No bile duct dilation. No pancreatic masses or inflammation. No adrenal masses.  Mild bilateral renal cortical thinning. There is a peripherally calcified hypo attenuating, 3.1 cm mass protruding from the inferior margin of the left kidney, not imaged on the prior chest CT. This is likely a mildly complicated cyst. There is a low attenuation lesion in the upper pole left kidney, most likely a simple cyst. No other renal masses or lesions. No hydronephrosis. Normal ureters. Bladder is unremarkable.  There is an infrarenal abdominal aortic aneurysm measuring 3.7 cm in greatest anterior-posterior dimension. No evidence of rupture. Atherosclerotic changes noted along the abdominal aorta and its branch vessels.  No pathologically enlarged lymph nodes. No abnormal fluid collections. Specifically no evidence of hemo peritoneum.  No bowel wall thickening to suggest a bowel hematoma. No bowel inflammatory changes. No mesenteric inflammation or contusion/hematoma.  MUSCULOSKELETAL:  There fractures of the right acetabulum. Fracture extends across the posterior column, extending across the medial acetabulum to the anterior,. Fracture is mildly distracted by 7 mm. There is mild fracture comminution with  small fracture fragments noted between the major fracture components, at least 1 within the hip joint.  There is a fracture of the left femoral neck. This is subcapital, oblique and non comminuted. Fracture is displaced with the distal fracture component migrating superiorly by 17 mm. There is also mild varus angulation as well as significant apex anterior angulation.  No other pelvic or proximal femur fractures.  There are no other fractures.  IMPRESSION:  1. Fractures of the right acetabulum involving the anterior posterior columns. 2. Fracture of the left femoral neck, mildly displaced and angulated. 3. No other fractures. 4. No other acute findings in the chest, abdomen or pelvis. No other evidence of acute injury. 5. Chronic findings include cardiomegaly, coronary artery calcifications and changes consistent with interstitial fibrosis. Pulmonary artery dilation suggest pulmonary artery hypertension. Below the diaphragm, there changes of cirrhosis with borderline splenomegaly. Probable left renal cysts. 6. 3.7 cm infrarenal abdominal aortic aneurysm. No evidence of rupture.   Electronically Signed   By: Lajean Manes M.D.   On: 05/30/2014 22:06   Dg Pelvis Portable  05/30/2014   CLINICAL DATA:  [place] today at 3 o'clock. Tripped over a door way. Left hip pain.  EXAM: PORTABLE PELVIS 1-2 VIEWS PORTABLE ONE-VIEW CHEST X-RAY.  COMPARISON:  HIP RADIOGRAPHS 10/01/2013 AND CHEST X-RAY 08/26/2013.  FINDINGS: CHEST X-RAY:  The heart is mildly enlarged but stable. There is tortuosity and calcification of the thoracic aorta. Chronic lung changes with pulmonary fibrosis. No definite acute overlying pulmonary process. The bony thorax is grossly intact.  Pelvis:  There is a displaced left femoral neck fracture. The right hip is intact. Moderate degenerative changes bilaterally, right greater than left. The pubic symphysis and SI joints are intact. No pelvic fractures. Extensive vascular calcifications are noted.  IMPRESSION:  1. Displaced left femoral neck fracture. 2. No acute cardiopulmonary findings. Chronic lung changes/pulmonary fibrosis.   Electronically Signed   By: Kalman Jewels M.D.   On: 05/30/2014 17:36   Dg Chest Port 1 View  05/31/2014   CLINICAL DATA:  80 year old male with low oxygen level. Subsequent encounter.  EXAM: PORTABLE CHEST - 1 VIEW  COMPARISON:  05/30/2014 and 08/26/2013.  FINDINGS: Pulmonary vascular congestion superimposed upon chronic lung changes.  Elevated left hemidiaphragm with left base subsegmental atelectasis.  No gross pneumothorax.  Cardiomegaly.  Calcified mildly tortuous aorta.  IMPRESSION: Pulmonary vascular congestion superimposed upon chronic lung changes.  Elevated left hemidiaphragm with left base subsegmental atelectasis.  No gross pneumothorax.  Cardiomegaly.  Calcified mildly tortuous aorta.   Electronically Signed   By: Chauncey Cruel M.D.   On: 05/31/2014 06:55   Dg Chest Port 1 View  05/30/2014   CLINICAL DATA:  80 year old male who fell at 1500 hr today tripping over door way. Acute pain. Initial encounter.  EXAM: PORTABLE CHEST - 1 VIEW  COMPARISON:  Chest CT 08/30/2013 and earlier.  FINDINGS: Portable AP semi upright view at 1716 hr. Lower lung volumes. Stable cardiomegaly and mediastinal contours. Peripheral increased opacity greater in the left lung is stable. No pneumothorax, pulmonary edema, or pleural effusion identified. No acute pulmonary opacity identified. Calcified atherosclerosis of the aorta. Visualized tracheal air column is within normal limits. No acute osseous abnormality identified.  IMPRESSION: Cardiomegaly and chronic lung disease. No superimposed acute findings are identified.   Electronically Signed   By: Lars Pinks M.D.   On: 05/30/2014 17:35    ROS Blood pressure 108/51, pulse 67, temperature 97.3 F (36.3 C), temperature source Oral, resp. rate 23, height _0  (1.854 m), weight 182 lb (82.555 kg), SpO2 93 %. Physical Exam To  follow  Assessment/Plan: Right acetabular fracture with medial displacement of quadrilateral plate.  Because of the displaced fracture pattern, percutaneous screws unlikely to be of significant benefit in achieving reduction given patient's age and anticipated bone quality.  Option would be formal open, medial approach through abdomen with buttress plating.  Weightbearing roof arcs appear to  be for the most part preserved.  At 77 with limited activity, there are reduced odds of long term progression of symptomatic arthritis due to widened lower joint. Joint does already appear to have some arthritis, however.  Repair would not accelarate post-op weight bearing or rehab.  Will discuss with patient but at this time recommend nonsurgical treatment with bed to chair transfers and total hip precautions.  Altamese , MD [facility] [phone] (p)   05/31/2014  8:21 AM

## 2014-05-31 NOTE — Consult Note (Signed)
   ORTHOPAEDIC CONSULTATION  REQUESTING PHYSICIAN: Nayana Abrol, MD  Chief Complaint: mechanical fall, R transverse acetab fx, Left fem neck fracture  HPI: [name] [name] is a 80 y.o. male who complains of pain and bilateral pelvis after a mechanical fall. Dr. Landau initially evaluated him he is not cleared for surgery today and has not surgical timeout of this week Dr. Landau has asked me to take over for surgical management of his left hip. Dr. Handy has evaluated his acetabular fracture and recommends touchdown weightbearing for the right side.  Past Medical History  Diagnosis Date  . Diabetes   . Atrial fibrillation   . Gynecomastia   . RBBB   . High cholesterol   . Hiatal hernia   . AAA (abdominal aortic aneurysm)   . Heart murmur   . Neuropathic impotence   . Neuropathy   . Macular degeneration, wet   . Pulmonary fibrosis   . Liver disease   . Left displaced femoral neck fracture 05/30/2014   Past Surgical History  Procedure Laterality Date  . Colostomy  1960  . Shoulder surgery Right   . Nose surgery      multiple times--broken nose/lacerations   History   Social History  . Marital Status: Widowed    Spouse Name: N/A    Number of Children: N/A  . Years of Education: N/A   Occupational History  . retired    Social History Main Topics  . Smoking status: Current Every Day Smoker -- 0.50 packs/day for 63 years    Types: Cigarettes  . Smokeless tobacco: None     Comment: smoke about 5-6 cigs qd  . Alcohol Use: No     Comment: quit 8 years ago  . Drug Use: No  . Sexual Activity: None   Other Topics Concern  . None   Social History Narrative   Family History  Problem Relation Age of Onset  . Rectal cancer Father   . COPD Brother    No Known Allergies Prior to Admission medications   Medication Sig Start Date End Date Taking? Authorizing Provider  antiseptic oral rinse (BIOTENE) LIQD 15 mLs by Mouth Rinse route as needed for dry mouth.   Yes  Historical Provider, MD  docusate sodium (COLACE) 100 MG capsule Take 100 mg by mouth 2 (two) times daily as needed for mild constipation.   Yes Historical Provider, MD  finasteride (PROSCAR) 5 MG tablet Take 5 mg by mouth daily.   Yes Historical Provider, MD  glipiZIDE (GLUCOTROL XL) 10 MG 24 hr tablet Take 20 mg by mouth daily with breakfast.   Yes Historical Provider, MD  Glucosamine-Chondroit-Vit C-Mn (GLUCOSAMINE 1500 COMPLEX PO) Take 1,500 mg by mouth 2 (two) times daily.    Yes Historical Provider, MD  guaiFENesin (MUCINEX) 600 MG 12 hr tablet Take 600 mg by mouth daily as needed for to loosen phlegm.   Yes Historical Provider, MD  metFORMIN (GLUCOPHAGE-XR) 500 MG 24 hr tablet Take 1,000 mg by mouth 2 (two) times daily.    Yes Historical Provider, MD  Multiple Vitamins-Minerals (EYE VITAMINS PO) Take 1 tablet by mouth daily. SMOTHERS FORMULA   Yes Historical Provider, MD  naproxen sodium (ANAPROX) 220 MG tablet Take 440 mg by mouth daily.    Yes Historical Provider, MD  Omega-3 Fatty Acids (FISH OIL) 1200 MG CAPS Take 1,200 mg by mouth 2 (two) times daily.    Yes Historical Provider, MD  rosuvastatin (CRESTOR) 10 MG tablet Take 10   mg by mouth daily.   Yes Historical Provider, MD  sotalol (BETAPACE) 80 MG tablet Take 80 mg by mouth 2 (two) times daily.   Yes Historical Provider, MD   Dg Thoracic Spine 2 View  05/30/2014   CLINICAL DATA:  Fall, mid back pain  EXAM: THORACIC SPINE - 2 VIEW  COMPARISON:  None.  FINDINGS: Normal thoracic kyphosis.  No evidence of fracture dislocation. Vertebral body heights are maintained.  Moderate multilevel degenerative changes.  Vascular calcifications.  Visualized lungs are clear.  IMPRESSION: No fracture or dislocation is seen.  Moderate multilevel degenerative changes.   Electronically Signed   By: Sriyesh  Krishnan M.D.   On: 05/30/2014 19:23   Dg Lumbar Spine Complete  05/30/2014   CLINICAL DATA:  Fall, mid/lower back pain  EXAM: LUMBAR SPINE - COMPLETE  4+ VIEW  COMPARISON:  CT abdomen dated 08/27/2013  FINDINGS: Five lumbar type vertebral bodies.  Normal lumbar lordosis.  No evidence of fracture or dislocation. Vertebral body heights are maintained.  Grade 1 anterolisthesis of L4 on L5.  Moderate multilevel degenerative changes.  Visualized bony pelvis appears intact.  Vascular calcifications with known infrarenal abdominal aortic aneurysm.a  IMPRESSION: No fracture or dislocation is seen.  Moderate degenerative changes.  Grade 1 anterolisthesis of L4 on L5.   Electronically Signed   By: Sriyesh  Krishnan M.D.   On: 05/30/2014 20:01   Dg Shoulder Right  05/30/2014   CLINICAL DATA:  Fall, right shoulder pain  EXAM: RIGHT SHOULDER - 2+ VIEW  COMPARISON:  None.  FINDINGS: No fracture or dislocation is seen.  Moderate degenerative changes of the glenohumeral joint and acromioclavicular joint  The visualized soft tissues are unremarkable.  Visualized right lung is clear.  IMPRESSION: No fracture or dislocation is seen.  Moderate degenerative changes.   Electronically Signed   By: Sriyesh  Krishnan M.D.   On: 05/30/2014 19:22   Dg Hip Complete Left  05/30/2014   CLINICAL DATA:  Left hip pain after falling at home today at 1500 hr.  EXAM: LEFT HIP - COMPLETE 2+ VIEW  COMPARISON:  Portable pelvis obtained earlier today.  FINDINGS: Left femoral neck fracture with mild valgus angulation. Atheromatous arterial calcifications.  IMPRESSION: Previously noted mildly displaced left femoral neck fracture with mild valgus angulation.   Electronically Signed   By: Steve  Reid M.D.   On: 05/30/2014 19:21   Ct Head Wo Contrast  05/30/2014   CLINICAL DATA:  Right neck pain after falling today and hitting his shoulder against a door jamb.  EXAM: CT HEAD WITHOUT CONTRAST  CT CERVICAL SPINE WITHOUT CONTRAST  TECHNIQUE: Multidetector CT imaging of the head and cervical spine was performed following the standard protocol without intravenous contrast. Multiplanar CT image  reconstructions of the cervical spine were also generated.  COMPARISON:  10/01/2013.  FINDINGS: CT HEAD FINDINGS  Diffusely enlarged ventricles and subarachnoid spaces. Patchy white matter low density in both cerebral hemispheres. No skull fracture, intracranial hemorrhage or paranasal sinus air-fluid levels. Dense arterial calcifications at the skull base.  CT CERVICAL SPINE FINDINGS  Extensive multilevel cervical spine degenerative changes. No prevertebral soft tissue swelling, fractures or subluxations. Bilateral vertebral and carotid artery atheromatous calcifications. Bullous changes at both lung apices.  IMPRESSION: 1. No skull fracture or intracranial hemorrhage. 2. No cervical spine fracture or subluxation. 3. Stable mid atrophy and chronic small vessel white matter ischemic changes. 4. Stable marked cervical spine degenerative changes. 5. COPD.   Electronically Signed   By: Steve    Reid M.D.   On: 05/30/2014 19:07   Ct Chest W Contrast  05/30/2014   CLINICAL DATA:  Fell today. Complaining of right shoulder and left hip pain.  EXAM: CT CHEST, ABDOMEN, AND PELVIS WITH CONTRAST  TECHNIQUE: Multidetector CT imaging of the chest, abdomen and pelvis was performed following the standard protocol during bolus administration of intravenous contrast.  CONTRAST:  100mL OMNIPAQUE IOHEXOL 300 MG/ML  SOLN  COMPARISON:  Chest CT, 08/30/2013.  FINDINGS: CT CHEST FINDINGS  Heart is mildly enlarged. There are dense coronary artery and mitral valve annular calcifications. Aorta is normal in caliber. There is partly calcified mild plaque along the thoracic aorta and at the origin of the left subclavian artery. No significant stenosis.  Right left pulmonary arteries are dilated to 3 cm, stable.  Borderline enlarged right peritracheal lymph node measures 1 cm short axis, stable. No mediastinal masses. No hilar masses or adenopathy.  Lungs show heterogeneous coarse peripheral reticular opacities consistent with interstitial  fibrosis, with a pattern suggesting UIP. Focal opacity noted in the left lower lobe on the prior exam measuring 15 mm x 7 mm, is without significant change. No acute lung consolidation or evidence of edema. No pleural effusion or pneumothorax.  CT ABDOMEN AND PELVIS FINDINGS  Liver shows morphologic changes consistent with cirrhosis. No liver mass or focal lesion. No contusion or laceration.  Mean is prominent measuring 12.4 cm in greatest dimension. No splenic mass or focal lesion.  Gallbladder is unremarkable. No bile duct dilation. No pancreatic masses or inflammation. No adrenal masses.  Mild bilateral renal cortical thinning. There is a peripherally calcified hypo attenuating, 3.1 cm mass protruding from the inferior margin of the left kidney, not imaged on the prior chest CT. This is likely a mildly complicated cyst. There is a low attenuation lesion in the upper pole left kidney, most likely a simple cyst. No other renal masses or lesions. No hydronephrosis. Normal ureters. Bladder is unremarkable.  There is an infrarenal abdominal aortic aneurysm measuring 3.7 cm in greatest anterior-posterior dimension. No evidence of rupture. Atherosclerotic changes noted along the abdominal aorta and its branch vessels.  No pathologically enlarged lymph nodes. No abnormal fluid collections. Specifically no evidence of hemo peritoneum.  No bowel wall thickening to suggest a bowel hematoma. No bowel inflammatory changes. No mesenteric inflammation or contusion/hematoma.  MUSCULOSKELETAL:  There fractures of the right acetabulum. Fracture extends across the posterior column, extending across the medial acetabulum to the anterior,. Fracture is mildly distracted by 7 mm. There is mild fracture comminution with small fracture fragments noted between the major fracture components, at least 1 within the hip joint.  There is a fracture of the left femoral neck. This is subcapital, oblique and non comminuted. Fracture is displaced  with the distal fracture component migrating superiorly by 17 mm. There is also mild varus angulation as well as significant apex anterior angulation.  No other pelvic or proximal femur fractures.  There are no other fractures.  IMPRESSION: 1. Fractures of the right acetabulum involving the anterior posterior columns. 2. Fracture of the left femoral neck, mildly displaced and angulated. 3. No other fractures. 4. No other acute findings in the chest, abdomen or pelvis. No other evidence of acute injury. 5. Chronic findings include cardiomegaly, coronary artery calcifications and changes consistent with interstitial fibrosis. Pulmonary artery dilation suggest pulmonary artery hypertension. Below the diaphragm, there changes of cirrhosis with borderline splenomegaly. Probable left renal cysts. 6. 3.7 cm infrarenal abdominal aortic aneurysm. No evidence of   rupture.   Electronically Signed   By: David  Ormond M.D.   On: 05/30/2014 22:06   Ct Cervical Spine Wo Contrast  05/30/2014   CLINICAL DATA:  Right neck pain after falling today and hitting his shoulder against a door jamb.  EXAM: CT HEAD WITHOUT CONTRAST  CT CERVICAL SPINE WITHOUT CONTRAST  TECHNIQUE: Multidetector CT imaging of the head and cervical spine was performed following the standard protocol without intravenous contrast. Multiplanar CT image reconstructions of the cervical spine were also generated.  COMPARISON:  10/01/2013.  FINDINGS: CT HEAD FINDINGS  Diffusely enlarged ventricles and subarachnoid spaces. Patchy white matter low density in both cerebral hemispheres. No skull fracture, intracranial hemorrhage or paranasal sinus air-fluid levels. Dense arterial calcifications at the skull base.  CT CERVICAL SPINE FINDINGS  Extensive multilevel cervical spine degenerative changes. No prevertebral soft tissue swelling, fractures or subluxations. Bilateral vertebral and carotid artery atheromatous calcifications. Bullous changes at both lung apices.   IMPRESSION: 1. No skull fracture or intracranial hemorrhage. 2. No cervical spine fracture or subluxation. 3. Stable mid atrophy and chronic small vessel white matter ischemic changes. 4. Stable marked cervical spine degenerative changes. 5. COPD.   Electronically Signed   By: Steve  Reid M.D.   On: 05/30/2014 19:07   Ct Abdomen Pelvis W Contrast  05/30/2014   CLINICAL DATA:  Fell today. Complaining of right shoulder and left hip pain.  EXAM: CT CHEST, ABDOMEN, AND PELVIS WITH CONTRAST  TECHNIQUE: Multidetector CT imaging of the chest, abdomen and pelvis was performed following the standard protocol during bolus administration of intravenous contrast.  CONTRAST:  100mL OMNIPAQUE IOHEXOL 300 MG/ML  SOLN  COMPARISON:  Chest CT, 08/30/2013.  FINDINGS: CT CHEST FINDINGS  Heart is mildly enlarged. There are dense coronary artery and mitral valve annular calcifications. Aorta is normal in caliber. There is partly calcified mild plaque along the thoracic aorta and at the origin of the left subclavian artery. No significant stenosis.  Right left pulmonary arteries are dilated to 3 cm, stable.  Borderline enlarged right peritracheal lymph node measures 1 cm short axis, stable. No mediastinal masses. No hilar masses or adenopathy.  Lungs show heterogeneous coarse peripheral reticular opacities consistent with interstitial fibrosis, with a pattern suggesting UIP. Focal opacity noted in the left lower lobe on the prior exam measuring 15 mm x 7 mm, is without significant change. No acute lung consolidation or evidence of edema. No pleural effusion or pneumothorax.  CT ABDOMEN AND PELVIS FINDINGS  Liver shows morphologic changes consistent with cirrhosis. No liver mass or focal lesion. No contusion or laceration.  Mean is prominent measuring 12.4 cm in greatest dimension. No splenic mass or focal lesion.  Gallbladder is unremarkable. No bile duct dilation. No pancreatic masses or inflammation. No adrenal masses.  Mild  bilateral renal cortical thinning. There is a peripherally calcified hypo attenuating, 3.1 cm mass protruding from the inferior margin of the left kidney, not imaged on the prior chest CT. This is likely a mildly complicated cyst. There is a low attenuation lesion in the upper pole left kidney, most likely a simple cyst. No other renal masses or lesions. No hydronephrosis. Normal ureters. Bladder is unremarkable.  There is an infrarenal abdominal aortic aneurysm measuring 3.7 cm in greatest anterior-posterior dimension. No evidence of rupture. Atherosclerotic changes noted along the abdominal aorta and its branch vessels.  No pathologically enlarged lymph nodes. No abnormal fluid collections. Specifically no evidence of hemo peritoneum.  No bowel wall thickening to suggest a   bowel hematoma. No bowel inflammatory changes. No mesenteric inflammation or contusion/hematoma.  MUSCULOSKELETAL:  There fractures of the right acetabulum. Fracture extends across the posterior column, extending across the medial acetabulum to the anterior,. Fracture is mildly distracted by 7 mm. There is mild fracture comminution with small fracture fragments noted between the major fracture components, at least 1 within the hip joint.  There is a fracture of the left femoral neck. This is subcapital, oblique and non comminuted. Fracture is displaced with the distal fracture component migrating superiorly by 17 mm. There is also mild varus angulation as well as significant apex anterior angulation.  No other pelvic or proximal femur fractures.  There are no other fractures.  IMPRESSION: 1. Fractures of the right acetabulum involving the anterior posterior columns. 2. Fracture of the left femoral neck, mildly displaced and angulated. 3. No other fractures. 4. No other acute findings in the chest, abdomen or pelvis. No other evidence of acute injury. 5. Chronic findings include cardiomegaly, coronary artery calcifications and changes consistent  with interstitial fibrosis. Pulmonary artery dilation suggest pulmonary artery hypertension. Below the diaphragm, there changes of cirrhosis with borderline splenomegaly. Probable left renal cysts. 6. 3.7 cm infrarenal abdominal aortic aneurysm. No evidence of rupture.   Electronically Signed   By: Lajean Manes M.D.   On: 05/30/2014 22:06   Dg Pelvis Portable  05/30/2014   CLINICAL DATA:  [place] today at 3 o'clock. Tripped over a door way. Left hip pain.  EXAM: PORTABLE PELVIS 1-2 VIEWS PORTABLE ONE-VIEW CHEST X-RAY.  COMPARISON:  HIP RADIOGRAPHS 10/01/2013 AND CHEST X-RAY 08/26/2013.  FINDINGS: CHEST X-RAY:  The heart is mildly enlarged but stable. There is tortuosity and calcification of the thoracic aorta. Chronic lung changes with pulmonary fibrosis. No definite acute overlying pulmonary process. The bony thorax is grossly intact.  Pelvis:  There is a displaced left femoral neck fracture. The right hip is intact. Moderate degenerative changes bilaterally, right greater than left. The pubic symphysis and SI joints are intact. No pelvic fractures. Extensive vascular calcifications are noted.  IMPRESSION: 1. Displaced left femoral neck fracture. 2. No acute cardiopulmonary findings. Chronic lung changes/pulmonary fibrosis.   Electronically Signed   By: Kalman Jewels M.D.   On: 05/30/2014 17:36   Dg Chest Port 1 View  05/31/2014   CLINICAL DATA:  80 year old male with low oxygen level. Subsequent encounter.  EXAM: PORTABLE CHEST - 1 VIEW  COMPARISON:  05/30/2014 and 08/26/2013.  FINDINGS: Pulmonary vascular congestion superimposed upon chronic lung changes.  Elevated left hemidiaphragm with left base subsegmental atelectasis.  No gross pneumothorax.  Cardiomegaly.  Calcified mildly tortuous aorta.  IMPRESSION: Pulmonary vascular congestion superimposed upon chronic lung changes.  Elevated left hemidiaphragm with left base subsegmental atelectasis.  No gross pneumothorax.  Cardiomegaly.  Calcified mildly  tortuous aorta.   Electronically Signed   By: Chauncey Cruel M.D.   On: 05/31/2014 06:55   Dg Chest Port 1 View  05/30/2014   CLINICAL DATA:  80 year old male who fell at 1500 hr today tripping over door way. Acute pain. Initial encounter.  EXAM: PORTABLE CHEST - 1 VIEW  COMPARISON:  Chest CT 08/30/2013 and earlier.  FINDINGS: Portable AP semi upright view at 1716 hr. Lower lung volumes. Stable cardiomegaly and mediastinal contours. Peripheral increased opacity greater in the left lung is stable. No pneumothorax, pulmonary edema, or pleural effusion identified. No acute pulmonary opacity identified. Calcified atherosclerosis of the aorta. Visualized tracheal air column is within normal limits. No acute osseous abnormality  identified.  IMPRESSION: Cardiomegaly and chronic lung disease. No superimposed acute findings are identified.   Electronically Signed   By: Lars Pinks M.D.   On: 05/30/2014 17:35    Positive ROS: All other systems have been reviewed and were otherwise negative with the exception of those mentioned in the HPI and as above.  Labs cbc  Recent Labs  05/30/14 1701 05/31/14 0315  WBC 9.6 9.9  HGB 12.4* 12.1*  HCT 37.9* 37.4*  PLT 156 151    Labs inflam No results for input(s): CRP in the last 72 hours.  Invalid input(s): ESR  Labs coag  Recent Labs  05/31/14 0315  INR 1.06     Recent Labs  05/30/14 1701 05/31/14 0315  NA 139 137  K 4.6 5.1  CL 101 100  CO2 24 27  GLUCOSE 227* 221*  BUN 18 19  CREATININE 0.94 1.08  CALCIUM 8.8 8.8    Physical Exam: Filed Vitals:   05/31/14 1000  BP: 120/66  Pulse: 71  Temp:   Resp: 25   General: Alert, no acute distress Cardiovascular: No pedal edema Respiratory: No cyanosis, no use of accessory musculature GI: No organomegaly, abdomen is soft and non-tender Skin: No lesions in the area of chief complaint other than those listed below in MSK exam.  Neurologic: Sensation intact distally Lymphatic: No axillary or  cervical lymphadenopathy  MUSCULOSKELETAL:  BLE: pain with ROM, wiggles toes and ankles, decreased global sensation as per his baseline, 2+ pulses, compartments soft Pelvis is stable Other extremities are atraumatic with painless ROM and NVI.  Assessment: R acetabular fracture L femoral neck fracture  Plan: OR on 11/5 at 07:30 for Left hip hemiarthroplasty -I long discussion with the family about the risks and benefits of surgery given his previous mobility and lack thereof at this time they've elected to go forward with the above mentioned surgery. -Dr. Marcelino Scot is weighed in for the right acetabular fracture and recommends nonoperative treatment with touchdown weightbearing. - bedrest for now until post op PT VTE px: SCD's and please hold chemical px on Thursday morning   Edmonia Lynch, D, MD Cell [phone]   05/31/2014 10:48 AM

## 2014-05-31 NOTE — Progress Notes (Signed)
CARE MANAGEMENT NOTE 05/31/2014  Patient:  [name] [name],[name] [name]   Account Number:  [id number]  Date Initiated:  05/31/2014  Documentation initiated by:  [facility]  Subjective/Objective Assessment:   mechanical fall, fracture     Action/Plan:   dtr- Tami Lin [phone]   Anticipated DC Date:     Anticipated DC Plan:  SKILLED NURSING FACILITY  In-house referral  Clinical Social Worker      DC Planning Services  CM consult      Choice offered to / List presented to:             Status of service:  In process, will continue to follow Medicare Important Message given?   (If response is "NO", the following Medicare IM given date fields will be blank) Date Medicare IM given:   Medicare IM given by:   Date Additional Medicare IM given:   Additional Medicare IM given by:    Discharge Disposition:    Per UR Regulation:    If discussed at Long Length of Stay Meetings, dates discussed:    Comments:  05/31/2014 1730 NCM spoke to pt's dtr, Tami Lin and son-in-law Vikki Ports [phone], [phone] respectively. They want SNF -rehab at dc. CSW referral made. Jonnie Finner RN CCM Case Mgmt phone [phone]

## 2014-05-31 NOTE — H&P (Signed)
[facility] History and Physical  [name] [name] GHW:[id number] DOB: 12-25-1926 DOA: 05/30/2014  Referring physician: ER physician. PCP: Mathews Argyle, MD   Chief Complaint: Fall.  HPI: [name] [name] is a 80 y.o. male with history of atrial fibrillation status post ablation, diabetes mellitus, hypertension and hyperlipidemia had a fall at his house when he was trying to cross the door. Patient did not hit his head or lose consciousness and patient was brought to the ER. In the ER workup revealed left hip fracture and also right acetabular fracture. Dr. Mardelle Matte was consulted from orthopedics. Patient was found to be hypoxic requiring 100% nonrebreather. Patient otherwise denies any chest pain or shortness of breath or any productive cough fever chills nausea vomiting abdominal pain. Patient has known history of interstitial lung disease and since patient was short of breath CT chest was done which was not showing anything acute except for the known interstitial lung disease. Patient in addition also has right shoulder pain since the fall.  Review of Systems: As presented in the history of presenting illness, rest negative.  Past Medical History  Diagnosis Date  . Diabetes   . Atrial fibrillation   . Gynecomastia   . RBBB   . High cholesterol   . Hiatal hernia   . AAA (abdominal aortic aneurysm)   . Heart murmur   . Neuropathic impotence   . Neuropathy   . Macular degeneration, wet   . Pulmonary fibrosis   . Liver disease   . Left displaced femoral neck fracture 05/30/2014   Past Surgical History  Procedure Laterality Date  . Colostomy  1960  . Shoulder surgery Right   . Nose surgery      multiple times--broken nose/lacerations   Social History:  reports that he has been smoking Cigarettes.  He has a 31.5 pack-year smoking history. He does not have any smokeless tobacco history on file. He reports that he does not drink alcohol or use illicit drugs. Where does patient  live home. Can patient participate in ADLs? Yes.  No Known Allergies  Family History:  Family History  Problem Relation Age of Onset  . Rectal cancer Father   . COPD Brother       Prior to Admission medications   Medication Sig Start Date End Date Taking? Authorizing Provider  antiseptic oral rinse (BIOTENE) LIQD 15 mLs by Mouth Rinse route as needed for dry mouth.   Yes Historical Provider, MD  docusate sodium (COLACE) 100 MG capsule Take 100 mg by mouth 2 (two) times daily as needed for mild constipation.   Yes Historical Provider, MD  finasteride (PROSCAR) 5 MG tablet Take 5 mg by mouth daily.   Yes Historical Provider, MD  glipiZIDE (GLUCOTROL XL) 10 MG 24 hr tablet Take 20 mg by mouth daily with breakfast.   Yes Historical Provider, MD  Glucosamine-Chondroit-Vit C-Mn (GLUCOSAMINE 1500 COMPLEX PO) Take 1,500 mg by mouth 2 (two) times daily.    Yes Historical Provider, MD  guaiFENesin (MUCINEX) 600 MG 12 hr tablet Take 600 mg by mouth daily as needed for to loosen phlegm.   Yes Historical Provider, MD  metFORMIN (GLUCOPHAGE-XR) 500 MG 24 hr tablet Take 1,000 mg by mouth 2 (two) times daily.    Yes Historical Provider, MD  Multiple Vitamins-Minerals (EYE VITAMINS PO) Take 1 tablet by mouth daily. SMOTHERS FORMULA   Yes Historical Provider, MD  naproxen sodium (ANAPROX) 220 MG tablet Take 440 mg by mouth daily.    Yes Historical Provider, MD  Omega-3 Fatty Acids (FISH OIL) 1200 MG CAPS Take 1,200 mg by mouth 2 (two) times daily.    Yes Historical Provider, MD  rosuvastatin (CRESTOR) 10 MG tablet Take 10 mg by mouth daily.   Yes Historical Provider, MD  sotalol (BETAPACE) 80 MG tablet Take 80 mg by mouth 2 (two) times daily.   Yes Historical Provider, MD    Physical Exam: Filed Vitals:   05/31/14 0000 05/31/14 0015 05/31/14 0030 05/31/14 0032  BP: 121/77 124/88 119/81 119/81  Pulse: 58 79  75  Temp:      TempSrc:      Resp: 23 24  20   Height:      Weight:      SpO2: 99% 93% 100%  100%     General:  Well-built and nourished.  Eyes: anicteric no pallor.  ENT: no discharge from ears eyes nose and mouth.  Neck: no mass felt.  Cardiovascular: S1-S2 heard.  Respiratory: no rhonchi or crepitations.  Abdomen: soft nontender bowel sounds present no guarding or rigidity.  Skin: no rash.  Musculoskeletal: pain on moving lower extremities. Patient also has pain in my right shoulder.  Psychiatric: appears normal.  Neurologic: alert awake oriented to time place and person. Moves all extremities.  Labs on Admission:  Basic Metabolic Panel:  Recent Labs Lab 05/30/14 1701  NA 139  K 4.6  CL 101  CO2 24  GLUCOSE 227*  BUN 18  CREATININE 0.94  CALCIUM 8.8   Liver Function Tests: No results for input(s): AST, ALT, ALKPHOS, BILITOT, PROT, ALBUMIN in the last 168 hours. No results for input(s): LIPASE, AMYLASE in the last 168 hours. No results for input(s): AMMONIA in the last 168 hours. CBC:  Recent Labs Lab 05/30/14 1701  WBC 9.6  NEUTROABS 7.9*  HGB 12.4*  HCT 37.9*  MCV 89.4  PLT 156   Cardiac Enzymes:  Recent Labs Lab 05/30/14 1701  TROPONINI <0.30    BNP (last 3 results) No results for input(s): PROBNP in the last 8760 hours. CBG: No results for input(s): GLUCAP in the last 168 hours.  Radiological Exams on Admission: Dg Thoracic Spine 2 View  05/30/2014   CLINICAL DATA:  Fall, mid back pain  EXAM: THORACIC SPINE - 2 VIEW  COMPARISON:  None.  FINDINGS: Normal thoracic kyphosis.  No evidence of fracture dislocation. Vertebral body heights are maintained.  Moderate multilevel degenerative changes.  Vascular calcifications.  Visualized lungs are clear.  IMPRESSION: No fracture or dislocation is seen.  Moderate multilevel degenerative changes.   Electronically Signed   By: Julian Hy M.D.   On: 05/30/2014 19:23   Dg Lumbar Spine Complete  05/30/2014   CLINICAL DATA:  Fall, mid/lower back pain  EXAM: LUMBAR SPINE - COMPLETE 4+ VIEW   COMPARISON:  CT abdomen dated 08/27/2013  FINDINGS: Five lumbar type vertebral bodies.  Normal lumbar lordosis.  No evidence of fracture or dislocation. Vertebral body heights are maintained.  Grade 1 anterolisthesis of L4 on L5.  Moderate multilevel degenerative changes.  Visualized bony pelvis appears intact.  Vascular calcifications with known infrarenal abdominal aortic aneurysm.a  IMPRESSION: No fracture or dislocation is seen.  Moderate degenerative changes.  Grade 1 anterolisthesis of L4 on L5.   Electronically Signed   By: Julian Hy M.D.   On: 05/30/2014 20:01   Dg Shoulder Right  05/30/2014   CLINICAL DATA:  Fall, right shoulder pain  EXAM: RIGHT SHOULDER - 2+ VIEW  COMPARISON:  None.  FINDINGS: No  fracture or dislocation is seen.  Moderate degenerative changes of the glenohumeral joint and acromioclavicular joint  The visualized soft tissues are unremarkable.  Visualized right lung is clear.  IMPRESSION: No fracture or dislocation is seen.  Moderate degenerative changes.   Electronically Signed   By: Julian Hy M.D.   On: 05/30/2014 19:22   Dg Hip Complete Left  05/30/2014   CLINICAL DATA:  Left hip pain after falling at home today at 1500 hr.  EXAM: LEFT HIP - COMPLETE 2+ VIEW  COMPARISON:  Portable pelvis obtained earlier today.  FINDINGS: Left femoral neck fracture with mild valgus angulation. Atheromatous arterial calcifications.  IMPRESSION: Previously noted mildly displaced left femoral neck fracture with mild valgus angulation.   Electronically Signed   By: Enrique Sack M.D.   On: 05/30/2014 19:21   Ct Head Wo Contrast  05/30/2014   CLINICAL DATA:  Right neck pain after falling today and hitting his shoulder against a door jamb.  EXAM: CT HEAD WITHOUT CONTRAST  CT CERVICAL SPINE WITHOUT CONTRAST  TECHNIQUE: Multidetector CT imaging of the head and cervical spine was performed following the standard protocol without intravenous contrast. Multiplanar CT image reconstructions of  the cervical spine were also generated.  COMPARISON:  10/01/2013.  FINDINGS: CT HEAD FINDINGS  Diffusely enlarged ventricles and subarachnoid spaces. Patchy white matter low density in both cerebral hemispheres. No skull fracture, intracranial hemorrhage or paranasal sinus air-fluid levels. Dense arterial calcifications at the skull base.  CT CERVICAL SPINE FINDINGS  Extensive multilevel cervical spine degenerative changes. No prevertebral soft tissue swelling, fractures or subluxations. Bilateral vertebral and carotid artery atheromatous calcifications. Bullous changes at both lung apices.  IMPRESSION: 1. No skull fracture or intracranial hemorrhage. 2. No cervical spine fracture or subluxation. 3. Stable mid atrophy and chronic small vessel white matter ischemic changes. 4. Stable marked cervical spine degenerative changes. 5. COPD.   Electronically Signed   By: Enrique Sack M.D.   On: 05/30/2014 19:07   Ct Chest W Contrast  05/30/2014   CLINICAL DATA:  [place] today. Complaining of right shoulder and left hip pain.  EXAM: CT CHEST, ABDOMEN, AND PELVIS WITH CONTRAST  TECHNIQUE: Multidetector CT imaging of the chest, abdomen and pelvis was performed following the standard protocol during bolus administration of intravenous contrast.  CONTRAST:  113mL OMNIPAQUE IOHEXOL 300 MG/ML  SOLN  COMPARISON:  Chest CT, 08/30/2013.  FINDINGS: CT CHEST FINDINGS  Heart is mildly enlarged. There are dense coronary artery and mitral valve annular calcifications. Aorta is normal in caliber. There is partly calcified mild plaque along the thoracic aorta and at the origin of the left subclavian artery. No significant stenosis.  Right left pulmonary arteries are dilated to 3 cm, stable.  Borderline enlarged right peritracheal lymph node measures 1 cm short axis, stable. No mediastinal masses. No hilar masses or adenopathy.  Lungs show heterogeneous coarse peripheral reticular opacities consistent with interstitial fibrosis, with a  pattern suggesting UIP. Focal opacity noted in the left lower lobe on the prior exam measuring 15 mm x 7 mm, is without significant change. No acute lung consolidation or evidence of edema. No pleural effusion or pneumothorax.  CT ABDOMEN AND PELVIS FINDINGS  Liver shows morphologic changes consistent with cirrhosis. No liver mass or focal lesion. No contusion or laceration.  Mean is prominent measuring 12.4 cm in greatest dimension. No splenic mass or focal lesion.  Gallbladder is unremarkable. No bile duct dilation. No pancreatic masses or inflammation. No adrenal masses.  Mild  bilateral renal cortical thinning. There is a peripherally calcified hypo attenuating, 3.1 cm mass protruding from the inferior margin of the left kidney, not imaged on the prior chest CT. This is likely a mildly complicated cyst. There is a low attenuation lesion in the upper pole left kidney, most likely a simple cyst. No other renal masses or lesions. No hydronephrosis. Normal ureters. Bladder is unremarkable.  There is an infrarenal abdominal aortic aneurysm measuring 3.7 cm in greatest anterior-posterior dimension. No evidence of rupture. Atherosclerotic changes noted along the abdominal aorta and its branch vessels.  No pathologically enlarged lymph nodes. No abnormal fluid collections. Specifically no evidence of hemo peritoneum.  No bowel wall thickening to suggest a bowel hematoma. No bowel inflammatory changes. No mesenteric inflammation or contusion/hematoma.  MUSCULOSKELETAL:  There fractures of the right acetabulum. Fracture extends across the posterior column, extending across the medial acetabulum to the anterior,. Fracture is mildly distracted by 7 mm. There is mild fracture comminution with small fracture fragments noted between the major fracture components, at least 1 within the hip joint.  There is a fracture of the left femoral neck. This is subcapital, oblique and non comminuted. Fracture is displaced with the distal  fracture component migrating superiorly by 17 mm. There is also mild varus angulation as well as significant apex anterior angulation.  No other pelvic or proximal femur fractures.  There are no other fractures.  IMPRESSION: 1. Fractures of the right acetabulum involving the anterior posterior columns. 2. Fracture of the left femoral neck, mildly displaced and angulated. 3. No other fractures. 4. No other acute findings in the chest, abdomen or pelvis. No other evidence of acute injury. 5. Chronic findings include cardiomegaly, coronary artery calcifications and changes consistent with interstitial fibrosis. Pulmonary artery dilation suggest pulmonary artery hypertension. Below the diaphragm, there changes of cirrhosis with borderline splenomegaly. Probable left renal cysts. 6. 3.7 cm infrarenal abdominal aortic aneurysm. No evidence of rupture.   Electronically Signed   By: Lajean Manes M.D.   On: 05/30/2014 22:06   Ct Cervical Spine Wo Contrast  05/30/2014   CLINICAL DATA:  Right neck pain after falling today and hitting his shoulder against a door jamb.  EXAM: CT HEAD WITHOUT CONTRAST  CT CERVICAL SPINE WITHOUT CONTRAST  TECHNIQUE: Multidetector CT imaging of the head and cervical spine was performed following the standard protocol without intravenous contrast. Multiplanar CT image reconstructions of the cervical spine were also generated.  COMPARISON:  10/01/2013.  FINDINGS: CT HEAD FINDINGS  Diffusely enlarged ventricles and subarachnoid spaces. Patchy white matter low density in both cerebral hemispheres. No skull fracture, intracranial hemorrhage or paranasal sinus air-fluid levels. Dense arterial calcifications at the skull base.  CT CERVICAL SPINE FINDINGS  Extensive multilevel cervical spine degenerative changes. No prevertebral soft tissue swelling, fractures or subluxations. Bilateral vertebral and carotid artery atheromatous calcifications. Bullous changes at both lung apices.  IMPRESSION: 1. No  skull fracture or intracranial hemorrhage. 2. No cervical spine fracture or subluxation. 3. Stable mid atrophy and chronic small vessel white matter ischemic changes. 4. Stable marked cervical spine degenerative changes. 5. COPD.   Electronically Signed   By: Enrique Sack M.D.   On: 05/30/2014 19:07   Ct Abdomen Pelvis W Contrast  05/30/2014   CLINICAL DATA:  [place] today. Complaining of right shoulder and left hip pain.  EXAM: CT CHEST, ABDOMEN, AND PELVIS WITH CONTRAST  TECHNIQUE: Multidetector CT imaging of the chest, abdomen and pelvis was performed following the standard protocol during  bolus administration of intravenous contrast.  CONTRAST:  149mL OMNIPAQUE IOHEXOL 300 MG/ML  SOLN  COMPARISON:  Chest CT, 08/30/2013.  FINDINGS: CT CHEST FINDINGS  Heart is mildly enlarged. There are dense coronary artery and mitral valve annular calcifications. Aorta is normal in caliber. There is partly calcified mild plaque along the thoracic aorta and at the origin of the left subclavian artery. No significant stenosis.  Right left pulmonary arteries are dilated to 3 cm, stable.  Borderline enlarged right peritracheal lymph node measures 1 cm short axis, stable. No mediastinal masses. No hilar masses or adenopathy.  Lungs show heterogeneous coarse peripheral reticular opacities consistent with interstitial fibrosis, with a pattern suggesting UIP. Focal opacity noted in the left lower lobe on the prior exam measuring 15 mm x 7 mm, is without significant change. No acute lung consolidation or evidence of edema. No pleural effusion or pneumothorax.  CT ABDOMEN AND PELVIS FINDINGS  Liver shows morphologic changes consistent with cirrhosis. No liver mass or focal lesion. No contusion or laceration.  Mean is prominent measuring 12.4 cm in greatest dimension. No splenic mass or focal lesion.  Gallbladder is unremarkable. No bile duct dilation. No pancreatic masses or inflammation. No adrenal masses.  Mild bilateral renal cortical  thinning. There is a peripherally calcified hypo attenuating, 3.1 cm mass protruding from the inferior margin of the left kidney, not imaged on the prior chest CT. This is likely a mildly complicated cyst. There is a low attenuation lesion in the upper pole left kidney, most likely a simple cyst. No other renal masses or lesions. No hydronephrosis. Normal ureters. Bladder is unremarkable.  There is an infrarenal abdominal aortic aneurysm measuring 3.7 cm in greatest anterior-posterior dimension. No evidence of rupture. Atherosclerotic changes noted along the abdominal aorta and its branch vessels.  No pathologically enlarged lymph nodes. No abnormal fluid collections. Specifically no evidence of hemo peritoneum.  No bowel wall thickening to suggest a bowel hematoma. No bowel inflammatory changes. No mesenteric inflammation or contusion/hematoma.  MUSCULOSKELETAL:  There fractures of the right acetabulum. Fracture extends across the posterior column, extending across the medial acetabulum to the anterior,. Fracture is mildly distracted by 7 mm. There is mild fracture comminution with small fracture fragments noted between the major fracture components, at least 1 within the hip joint.  There is a fracture of the left femoral neck. This is subcapital, oblique and non comminuted. Fracture is displaced with the distal fracture component migrating superiorly by 17 mm. There is also mild varus angulation as well as significant apex anterior angulation.  No other pelvic or proximal femur fractures.  There are no other fractures.  IMPRESSION: 1. Fractures of the right acetabulum involving the anterior posterior columns. 2. Fracture of the left femoral neck, mildly displaced and angulated. 3. No other fractures. 4. No other acute findings in the chest, abdomen or pelvis. No other evidence of acute injury. 5. Chronic findings include cardiomegaly, coronary artery calcifications and changes consistent with interstitial  fibrosis. Pulmonary artery dilation suggest pulmonary artery hypertension. Below the diaphragm, there changes of cirrhosis with borderline splenomegaly. Probable left renal cysts. 6. 3.7 cm infrarenal abdominal aortic aneurysm. No evidence of rupture.   Electronically Signed   By: Lajean Manes M.D.   On: 05/30/2014 22:06   Dg Pelvis Portable  05/30/2014   CLINICAL DATA:  [place] today at 3 o'clock. Tripped over a door way. Left hip pain.  EXAM: PORTABLE PELVIS 1-2 VIEWS PORTABLE ONE-VIEW CHEST X-RAY.  COMPARISON:  HIP RADIOGRAPHS  10/01/2013 AND CHEST X-RAY 08/26/2013.  FINDINGS: CHEST X-RAY:  The heart is mildly enlarged but stable. There is tortuosity and calcification of the thoracic aorta. Chronic lung changes with pulmonary fibrosis. No definite acute overlying pulmonary process. The bony thorax is grossly intact.  Pelvis:  There is a displaced left femoral neck fracture. The right hip is intact. Moderate degenerative changes bilaterally, right greater than left. The pubic symphysis and SI joints are intact. No pelvic fractures. Extensive vascular calcifications are noted.  IMPRESSION: 1. Displaced left femoral neck fracture. 2. No acute cardiopulmonary findings. Chronic lung changes/pulmonary fibrosis.   Electronically Signed   By: Kalman Jewels M.D.   On: 05/30/2014 17:36   Dg Chest Port 1 View  05/30/2014   CLINICAL DATA:  80 year old male who fell at 1500 hr today tripping over door way. Acute pain. Initial encounter.  EXAM: PORTABLE CHEST - 1 VIEW  COMPARISON:  Chest CT 08/30/2013 and earlier.  FINDINGS: Portable AP semi upright view at 1716 hr. Lower lung volumes. Stable cardiomegaly and mediastinal contours. Peripheral increased opacity greater in the left lung is stable. No pneumothorax, pulmonary edema, or pleural effusion identified. No acute pulmonary opacity identified. Calcified atherosclerosis of the aorta. Visualized tracheal air column is within normal limits. No acute osseous abnormality  identified.  IMPRESSION: Cardiomegaly and chronic lung disease. No superimposed acute findings are identified.   Electronically Signed   By: Lars Pinks M.D.   On: 05/30/2014 17:35    EKG: Independently reviewed. Normal sinus rhythm with RBBB.  Assessment/Plan Principal Problem:   Hip fracture, left Active Problems:   Interstitial lung disease   Hyperlipidemia   History of atrial fibrillation   Diabetes mellitus, controlled   Hip fracture   1. Left hip fracture status post mechanical fall - at this time patient is quite hypoxic and given patient's history of interstitial lung disease and have consulted pulmonary critical care for clearance for surgery. Patient will be kept nothing by mouth in anticipation of possible surgery once pulmonary critical care clears. 2. Hypoxia with interstitial lung disease - patient has known history of interstitial lung disease and at this time patient is requiring 100% nonrebreather and I have consulted pulmonary critical care. Check BNP. 3. Diabetes mellitus type 2 - continue home medications but hold metformin since patient has received contrast. Closely follow CBGs with slight scale coverage. 4. History of atrial fibrillation status post ablation presently in sinus rhythm - continue sotalol. 5. Hypertension - continue present medications. 6. Hyperlipidemia - continue present medications. 7. Mild anemia - follow CBC and if there is no significant fall in hemoglobin further workup as outpatient.    Code Status: DO NOT RESUSCITATE.  Family Communication: patient's daughter at the bedside.  Disposition Plan: admit to inpatient.    Syreeta Figler N. [facility] Pager [phone].  If 7PM-7AM, please contact night-coverage [email or web address] Password [email or web address] 05/31/2014, 1:21 AM

## 2014-05-31 NOTE — Progress Notes (Signed)
RN page this PA secondary to elevated troponin of 0.87.  Pt asymptomatic, denies cp, sob. Other VSS.  Ordered 12 lead EKG which shows sinus rhytm with 1st degree block and ?inferolateral ischemia, change from previous EKG. Pt already on BB and statin. Ordered ASA 325, will cycle enzymes, and get 2D echo.   Plan discussed with Delford Field, MD [facility] [place] [facility] [phone]

## 2014-05-31 NOTE — Progress Notes (Signed)
Utilization review complete. Novalyn Lajara RN CCM Case Mgmt phone [phone] 

## 2014-05-31 NOTE — Plan of Care (Signed)
Problem: Consults Goal: Hip/Femur Fracture Patient Education See Patient Education Module for education specifics. Outcome: Completed/Met Date Met:  05/31/14 Goal: Skin Care Protocol Initiated - if Braden Score 18 or less If consults are not indicated, leave blank or document N/A Outcome: Completed/Met Date Met:  05/31/14 Goal: Nutrition Consult-if indicated Outcome: Progressing  Problem: Phase I Progression Outcomes Goal: Pre op pain controlled with appropriate interventions Outcome: Progressing Goal: Pre op Medical MD consult, if indicated Outcome: Completed/Met Date Met:  05/31/14 Goal: Pre op labs/procedures/consults per MD order Outcome: Completed/Met Date Met:  05/31/14 Goal: Pre op Protime within normal limits Outcome: Progressing Goal: Pre op NPO per MD orders Outcome: Completed/Met Date Met:  05/31/14

## 2014-06-01 ENCOUNTER — Inpatient Hospital Stay (HOSPITAL_COMMUNITY): Payer: Medicare Other | Admitting: Anesthesiology

## 2014-06-01 ENCOUNTER — Encounter (HOSPITAL_COMMUNITY)
Admission: EM | Disposition: A | Discharge status: Skilled Nursing Facility | Payer: Self-pay | Source: Home / Self Care | Attending: Internal Medicine

## 2014-06-01 ENCOUNTER — Encounter (HOSPITAL_COMMUNITY): Payer: Self-pay | Admitting: Interventional Cardiology

## 2014-06-01 ENCOUNTER — Inpatient Hospital Stay (HOSPITAL_COMMUNITY): Payer: Medicare Other

## 2014-06-01 DIAGNOSIS — R778 Other specified abnormalities of plasma proteins: Secondary | ICD-10-CM | POA: Diagnosis present

## 2014-06-01 DIAGNOSIS — R7989 Other specified abnormal findings of blood chemistry: Secondary | ICD-10-CM

## 2014-06-01 DIAGNOSIS — Z8679 Personal history of other diseases of the circulatory system: Secondary | ICD-10-CM

## 2014-06-01 HISTORY — PX: HIP ARTHROPLASTY: SHX981

## 2014-06-01 LAB — GLUCOSE, CAPILLARY
GLUCOSE-CAPILLARY: 149 mg/dL — AB (ref 70–99)
GLUCOSE-CAPILLARY: 170 mg/dL — AB (ref 70–99)
GLUCOSE-CAPILLARY: 171 mg/dL — AB (ref 70–99)
Glucose-Capillary: 196 mg/dL — ABNORMAL HIGH (ref 70–99)
Glucose-Capillary: 191 mg/dL — ABNORMAL HIGH (ref 70–99)
Glucose-Capillary: 157 mg/dL — ABNORMAL HIGH (ref 70–99)
Glucose-Capillary: 152 mg/dL — ABNORMAL HIGH (ref 70–99)

## 2014-06-01 LAB — AMMONIA: Ammonia: 22 umol/L (ref 11–60)

## 2014-06-01 LAB — CBC
HEMATOCRIT: 35.8 % — AB (ref 39.0–52.0)
HEMOGLOBIN: 11.6 g/dL — AB (ref 13.0–17.0)
MCH: 29 pg (ref 26.0–34.0)
MCHC: 32.4 g/dL (ref 30.0–36.0)
MCV: 89.5 fL (ref 78.0–100.0)
Platelets: 141 10*3/uL — ABNORMAL LOW (ref 150–400)
RBC: 4 MIL/uL — AB (ref 4.22–5.81)
RDW: 14.3 % (ref 11.5–15.5)
WBC: 8.8 10*3/uL (ref 4.0–10.5)

## 2014-06-01 LAB — COMPREHENSIVE METABOLIC PANEL
ALT: 12 U/L (ref 0–53)
ANION GAP: 14 (ref 5–15)
AST: 24 U/L (ref 0–37)
Albumin: 3.1 g/dL — ABNORMAL LOW (ref 3.5–5.2)
Alkaline Phosphatase: 84 U/L (ref 39–117)
BILIRUBIN TOTAL: 0.5 mg/dL (ref 0.3–1.2)
BUN: 26 mg/dL — AB (ref 6–23)
CHLORIDE: 103 meq/L (ref 96–112)
CO2: 22 meq/L (ref 19–32)
CREATININE: 1.19 mg/dL (ref 0.50–1.35)
Calcium: 8.8 mg/dL (ref 8.4–10.5)
GFR calc Af Amer: 62 mL/min — ABNORMAL LOW (ref >90)
GFR, EST NON AFRICAN AMERICAN: 54 mL/min — AB (ref >90)
Glucose, Bld: 196 mg/dL — ABNORMAL HIGH (ref 70–99)
POTASSIUM: 4.9 meq/L (ref 3.7–5.3)
Sodium: 139 mEq/L (ref 137–147)
Total Protein: 6.7 g/dL (ref 6.0–8.3)

## 2014-06-01 LAB — URINE CULTURE
CULTURE: NO GROWTH
Colony Count: NO GROWTH

## 2014-06-01 SURGERY — HEMIARTHROPLASTY, HIP, DIRECT ANTERIOR APPROACH, FOR FRACTURE
Anesthesia: Monitor Anesthesia Care | Site: Hip | Laterality: Left

## 2014-06-01 MED ORDER — FENTANYL CITRATE 0.05 MG/ML IJ SOLN
INTRAMUSCULAR | Status: AC
  Filled 2014-06-01: qty 5

## 2014-06-01 MED ORDER — MORPHINE SULFATE 0.5 MG/ML IJ SOLN
0.2000 mg | Freq: Once | INTRAMUSCULAR | Status: AC
  Administered 2014-06-01: 0.2 mg via EPIDURAL
  Filled 2014-06-01: qty 2

## 2014-06-01 MED ORDER — VITAMIN D3 25 MCG (1000 UNIT) PO TABS
1000.0000 [IU] | ORAL_TABLET | Freq: Every day | ORAL | Status: DC
  Administered 2014-06-02 – 2014-06-06 (×5): 1000 [IU] via ORAL
  Filled 2014-06-01 (×6): qty 1

## 2014-06-01 MED ORDER — MEPERIDINE HCL 25 MG/ML IJ SOLN
6.2500 mg | INTRAMUSCULAR | Status: DC | PRN
Start: 2014-06-01 — End: 2014-06-01

## 2014-06-01 MED ORDER — DOCUSATE SODIUM 100 MG PO CAPS
100.0000 mg | ORAL_CAPSULE | Freq: Two times a day (BID) | ORAL | Status: DC
  Administered 2014-06-01 – 2014-06-04 (×6): 100 mg via ORAL
  Filled 2014-06-01 (×7): qty 1

## 2014-06-01 MED ORDER — SENNA 8.6 MG PO TABS
1.0000 | ORAL_TABLET | Freq: Two times a day (BID) | ORAL | Status: DC
  Administered 2014-06-01 – 2014-06-06 (×10): 8.6 mg via ORAL
  Filled 2014-06-01 (×11): qty 1

## 2014-06-01 MED ORDER — SODIUM CHLORIDE 0.9 % IV SOLN
INTRAVENOUS | Status: DC
  Administered 2014-06-01: 10 mL/h via INTRAVENOUS

## 2014-06-01 MED ORDER — MENTHOL 3 MG MT LOZG
1.0000 | LOZENGE | OROMUCOSAL | Status: DC | PRN
  Filled 2014-06-01: qty 9

## 2014-06-01 MED ORDER — ACETAMINOPHEN 650 MG RE SUPP: 650.0000 mg | Freq: Four times a day (QID) | RECTAL | Status: DC | PRN

## 2014-06-01 MED ORDER — PHENOL 1.4 % MT LIQD
1.0000 | OROMUCOSAL | Status: DC | PRN
Start: 2014-06-01 — End: 2014-06-06
  Filled 2014-06-01: qty 177

## 2014-06-01 MED ORDER — PHENYLEPHRINE HCL 10 MG/ML IJ SOLN
INTRAMUSCULAR | Status: DC | PRN
  Administered 2014-06-01 (×2): 80 ug via INTRAVENOUS
  Administered 2014-06-01: 200 ug via INTRAVENOUS
  Administered 2014-06-01: 120 ug via INTRAVENOUS
  Administered 2014-06-01: 40 ug via INTRAVENOUS
  Administered 2014-06-01: 80 ug via INTRAVENOUS
  Administered 2014-06-01: 40 ug via INTRAVENOUS
  Administered 2014-06-01: 80 ug via INTRAVENOUS

## 2014-06-01 MED ORDER — ALUM & MAG HYDROXIDE-SIMETH 200-200-20 MG/5ML PO SUSP: 30.0000 mL | ORAL | Status: DC | PRN

## 2014-06-01 MED ORDER — SODIUM CHLORIDE 0.9 % IR SOLN
Status: DC | PRN
  Administered 2014-06-01: 3000 mL

## 2014-06-01 MED ORDER — PROPOFOL 10 MG/ML IV BOLUS
INTRAVENOUS | Status: DC | PRN
  Administered 2014-06-01: 30 mg via INTRAVENOUS
  Administered 2014-06-01: 15 mg via INTRAVENOUS

## 2014-06-01 MED ORDER — SODIUM CHLORIDE 0.9 % IR SOLN
Status: DC | PRN
  Administered 2014-06-01: 1000 mL

## 2014-06-01 MED ORDER — POLYETHYLENE GLYCOL 3350 17 G PO PACK
17.0000 g | PACK | Freq: Every day | ORAL | Status: DC | PRN
  Filled 2014-06-01: qty 1

## 2014-06-01 MED ORDER — SODIUM CHLORIDE 0.9 % IV SOLN
INTRAVENOUS | Status: DC | PRN
  Administered 2014-06-01: 14:00:00 via INTRAVENOUS

## 2014-06-01 MED ORDER — PROPOFOL INFUSION 10 MG/ML OPTIME
INTRAVENOUS | Status: DC | PRN
Start: 2014-06-01 — End: 2014-06-01
  Administered 2014-06-01: 25 ug/kg/min via INTRAVENOUS

## 2014-06-01 MED ORDER — ACETAMINOPHEN 325 MG PO TABS
650.0000 mg | ORAL_TABLET | Freq: Four times a day (QID) | ORAL | Status: DC | PRN
  Administered 2014-06-06: 650 mg via ORAL
  Filled 2014-06-01: qty 2

## 2014-06-01 MED ORDER — LACTATED RINGERS IV SOLN
INTRAVENOUS | Status: DC | PRN
  Administered 2014-06-01: 13:00:00 via INTRAVENOUS

## 2014-06-01 MED ORDER — ACETAMINOPHEN 325 MG PO TABS: 650.0000 mg | ORAL_TABLET | Freq: Four times a day (QID) | ORAL | Status: DC | PRN

## 2014-06-01 MED ORDER — FENTANYL CITRATE 0.05 MG/ML IJ SOLN
INTRAMUSCULAR | Status: DC | PRN
  Administered 2014-06-01: 25 ug via INTRAVENOUS

## 2014-06-01 MED ORDER — PROPOFOL 10 MG/ML IV BOLUS
INTRAVENOUS | Status: AC
  Filled 2014-06-01: qty 20

## 2014-06-01 MED ORDER — ENOXAPARIN SODIUM 40 MG/0.4ML ~~LOC~~ SOLN
40.0000 mg | SUBCUTANEOUS | Status: DC
  Administered 2014-06-02 – 2014-06-06 (×5): 40 mg via SUBCUTANEOUS
  Filled 2014-06-01 (×6): qty 0.4

## 2014-06-01 MED ORDER — LACTATED RINGERS IV SOLN
INTRAVENOUS | Status: DC
  Administered 2014-06-01: 12:00:00 via INTRAVENOUS

## 2014-06-01 MED ORDER — ACETAMINOPHEN 500 MG PO TABS
1000.0000 mg | ORAL_TABLET | Freq: Four times a day (QID) | ORAL | Status: AC
  Administered 2014-06-01 – 2014-06-02 (×3): 1000 mg via ORAL
  Filled 2014-06-01 (×4): qty 2

## 2014-06-01 MED ORDER — MAGNESIUM CITRATE PO SOLN
1.0000 | Freq: Once | ORAL | Status: AC | PRN
  Filled 2014-06-01: qty 296

## 2014-06-01 MED ORDER — MORPHINE SULFATE 2 MG/ML IJ SOLN: 1.0000 mg | INTRAMUSCULAR | Status: DC | PRN

## 2014-06-01 MED ORDER — BISACODYL 10 MG RE SUPP: 10.0000 mg | Freq: Every day | RECTAL | Status: DC | PRN

## 2014-06-01 MED ORDER — BUPIVACAINE HCL (PF) 0.5 % IJ SOLN
INTRAMUSCULAR | Status: DC | PRN
  Administered 2014-06-01: 10 mL

## 2014-06-01 MED ORDER — TETRACAINE HCL 1 % IJ SOLN
6.0000 mg | Freq: Once | INTRAMUSCULAR | Status: DC
  Filled 2014-06-01: qty 2

## 2014-06-01 MED ORDER — EPHEDRINE SULFATE 50 MG/ML IJ SOLN
INTRAMUSCULAR | Status: DC | PRN
  Administered 2014-06-01: 15 mg via INTRAVENOUS
  Administered 2014-06-01 (×2): 10 mg via INTRAVENOUS
  Administered 2014-06-01: 15 mg via INTRAVENOUS

## 2014-06-01 MED ORDER — CEFAZOLIN SODIUM-DEXTROSE 2-3 GM-% IV SOLR: 2.0000 g | Freq: Four times a day (QID) | INTRAVENOUS | Status: DC

## 2014-06-01 MED ORDER — ENOXAPARIN SODIUM 40 MG/0.4ML ~~LOC~~ SOLN: 40.0000 mg | SUBCUTANEOUS | Status: DC

## 2014-06-01 MED ORDER — PROMETHAZINE HCL 25 MG/ML IJ SOLN: 6.2500 mg | INTRAMUSCULAR | Status: DC | PRN

## 2014-06-01 MED ORDER — FENTANYL CITRATE 0.05 MG/ML IJ SOLN: 25.0000 ug | INTRAMUSCULAR | Status: DC | PRN

## 2014-06-01 MED ORDER — DEXTROSE 5 % IV SOLN
0.0000 ug/min | INTRAVENOUS | Status: DC
  Filled 2014-06-01: qty 1

## 2014-06-01 SURGICAL SUPPLY — 59 items
BENZOIN TINCTURE PRP APPL 2/3 (GAUZE/BANDAGES/DRESSINGS) ×3 IMPLANT
BLADE SAW SGTL 18.5X63.X.64 HD (BLADE) ×3 IMPLANT
BRUSH FEMORAL CANAL (MISCELLANEOUS) IMPLANT
CAPT HIP FX BIPOLAR/UNIPOLAR ×3 IMPLANT
CLOSURE STERI-STRIP 1/2X4 (GAUZE/BANDAGES/DRESSINGS) ×2
CLSR STERI-STRIP ANTIMIC 1/2X4 (GAUZE/BANDAGES/DRESSINGS) ×4 IMPLANT
COVER BACK TABLE 24X17X13 BIG (DRAPES) IMPLANT
COVER SURGICAL LIGHT HANDLE (MISCELLANEOUS) ×3 IMPLANT
DRAPE IMP U-DRAPE 54X76 (DRAPES) ×3 IMPLANT
DRAPE INCISE IOBAN 66X45 STRL (DRAPES) IMPLANT
DRAPE ORTHO SPLIT 77X108 STRL (DRAPES) ×4
DRAPE SURG ORHT 6 SPLT 77X108 (DRAPES) ×2 IMPLANT
DRAPE U-SHAPE 47X51 STRL (DRAPES) ×3 IMPLANT
DRILL BIT 5/64 (BIT) ×3 IMPLANT
DRSG MEPILEX BORDER 4X12 (GAUZE/BANDAGES/DRESSINGS) IMPLANT
DRSG MEPILEX BORDER 4X8 (GAUZE/BANDAGES/DRESSINGS) ×3 IMPLANT
DURAPREP 26ML APPLICATOR (WOUND CARE) ×3 IMPLANT
ELECT BLADE 6.5 EXT (BLADE) IMPLANT
ELECT CAUTERY BLADE 6.4 (BLADE) ×3 IMPLANT
ELECT REM PT RETURN 9FT ADLT (ELECTROSURGICAL) ×3
ELECTRODE REM PT RTRN 9FT ADLT (ELECTROSURGICAL) ×1 IMPLANT
FACESHIELD WRAPAROUND (MASK) ×3 IMPLANT
GLOVE BIOGEL PI ORTHO PRO SZ8 (GLOVE) ×2
GLOVE ORTHO TXT STRL SZ7.5 (GLOVE) ×3 IMPLANT
GLOVE PI ORTHO PRO STRL SZ8 (GLOVE) ×1 IMPLANT
GLOVE SURG ORTHO 8.0 STRL STRW (GLOVE) ×6 IMPLANT
GOWN STRL REUS W/ TWL XL LVL3 (GOWN DISPOSABLE) ×1 IMPLANT
GOWN STRL REUS W/TWL 2XL LVL3 (GOWN DISPOSABLE) ×3 IMPLANT
GOWN STRL REUS W/TWL XL LVL3 (GOWN DISPOSABLE) ×2
HANDPIECE INTERPULSE COAX TIP (DISPOSABLE) ×2
KIT BASIN OR (CUSTOM PROCEDURE TRAY) ×3 IMPLANT
KIT ROOM TURNOVER OR (KITS) ×3 IMPLANT
MANIFOLD NEPTUNE II (INSTRUMENTS) ×3 IMPLANT
NDL SUT 6 .5 CRC .975X.05 MAYO (NEEDLE) ×1 IMPLANT
NEEDLE 22X1 1/2 (OR ONLY) (NEEDLE) ×3 IMPLANT
NEEDLE MAYO TAPER (NEEDLE) ×2
NS IRRIG 1000ML POUR BTL (IV SOLUTION) ×3 IMPLANT
PACK TOTAL JOINT (CUSTOM PROCEDURE TRAY) ×3 IMPLANT
PACK UNIVERSAL I (CUSTOM PROCEDURE TRAY) ×3 IMPLANT
PAD ARMBOARD 7.5X6 YLW CONV (MISCELLANEOUS) ×6 IMPLANT
PASSER SUT SWANSON 36MM LOOP (INSTRUMENTS) ×3 IMPLANT
PILLOW ABDUCTION HIP (SOFTGOODS) ×3 IMPLANT
PRESSURIZER FEMORAL UNIV (MISCELLANEOUS) IMPLANT
RETRIEVER SUT HEWSON (MISCELLANEOUS) ×3 IMPLANT
SET HNDPC FAN SPRY TIP SCT (DISPOSABLE) ×1 IMPLANT
SUT FIBERWIRE #2 38 REV NDL BL (SUTURE) ×6
SUT MNCRL AB 4-0 PS2 18 (SUTURE) ×3 IMPLANT
SUT VIC AB 0 CT1 27 (SUTURE) ×2
SUT VIC AB 0 CT1 27XBRD ANBCTR (SUTURE) ×1 IMPLANT
SUT VIC AB 1 CT1 27 (SUTURE) ×4
SUT VIC AB 1 CT1 27XBRD ANBCTR (SUTURE) ×2 IMPLANT
SUT VIC AB 3-0 SH 8-18 (SUTURE) ×3 IMPLANT
SUTURE FIBERWR#2 38 REV NDL BL (SUTURE) ×2 IMPLANT
SYR CONTROL 10ML LL (SYRINGE) ×3 IMPLANT
TOWEL OR 17X24 6PK STRL BLUE (TOWEL DISPOSABLE) ×3 IMPLANT
TOWEL OR 17X26 10 PK STRL BLUE (TOWEL DISPOSABLE) ×3 IMPLANT
TOWER CARTRIDGE SMART MIX (DISPOSABLE) IMPLANT
TRAY FOLEY CATH 16FRSI W/METER (SET/KITS/TRAYS/PACK) IMPLANT
WATER STERILE IRR 1000ML POUR (IV SOLUTION) IMPLANT

## 2014-06-01 NOTE — Progress Notes (Signed)
[facility] PROGRESS NOTE  [name] [name] BHA:[id number] DOB: Mar 08, 1927 DOA: 05/30/2014 PCP: Mathews Argyle, MD  Assessment/Plan:   Left hip fracture status post mechanical fall       -has high cardiac and Pulm risk       -appreciate Pulm and Cards input       -Ortho following, family leaning towards proceeding with surgery despite risks involved       -DVT proph: to be started post op   Abnormal troponin-trending down likely secondary to demand ischemia, cardiology consulted, 2-D echo completed, wall motion abnormality noted, continue ASA/statin, remains on sotalol        Severe COPD/Chronic hypoxemia with interstitial lung disease        -Pulm consult appreciated, add nebs PRN   Diabetes mellitus type 2        -stable, continue SSI        UTI       -continue ceftriaxone, FU urine Cx   History of atrial fibrillation status post ablation presently in sinus rhythm - continue sotalol.   Hypertension - continue present medications.   Hyperlipidemia - continue present medications.   Mild anemia - follow CBC and if there is no significant fall in hemoglobin further workup as outpatient       COnfusion/Encephalopathy      -due to meds, UTI, COPD etc      -caution with narcotics, monitor        DVT proph: to be resumed post op  Code Status: full Family Communication: no family at bedside Disposition Plan:  As above    Brief narrative: [name] [name] is a 80 y.o. male with history of atrial fibrillation status post ablation, diabetes mellitus, hypertension and hyperlipidemia had a fall at his house when he was trying to cross the door. Patient did not hit his head or lose consciousness and patient was brought to the ER. In the ER workup revealed left hip fracture and also right acetabular fracture. Dr. Mardelle Matte was consulted from orthopedics. Patient was found to be hypoxic requiring 100% nonrebreather. Patient otherwise denies any chest pain or shortness of breath  or any productive cough fever chills nausea vomiting abdominal pain. Patient has known history of interstitial lung disease and since patient was short of breath CT chest was done which was not showing anything acute except for the known interstitial lung disease. Patient in addition also has right shoulder pain since the fall.  Consultants:  Cardiology  Pulmonary  Orthopedics  Procedures:  none  Antibiotics:  none  HPI/Subjective: Patient complaining of pain in his hip, mild nausea, dry mouth, denies chest pain or shortness of breath  Objective: Filed Vitals:   06/01/14 1530 06/01/14 1545 06/01/14 1600 06/01/14 1615  BP: 103/86 147/82 126/75 119/53  Pulse: 86 87 84 81  Temp:    97.6 F (36.4 C)  TempSrc:      Resp: 30 32 26 27  Height:      Weight:      SpO2: 100% 97% 92% 97%    Intake/Output Summary (Last 24 hours) at 06/01/14 1753 Last data filed at 06/01/14 1600  Gross per 24 hour  Intake 3051.83 ml  Output    970 ml  Net 2081.83 ml    Exam:   General: Well-built and nourished.  Eyes: anicteric no pallor.  ENT: no discharge from ears eyes nose and mouth.  Neck: no mass felt.  Cardiovascular: S1-S2 heard.  Respiratory: no rhonchi or crepitations.  Abdomen: soft  nontender bowel sounds present no guarding or rigidity.  Skin: no rash.  Musculoskeletal: pain on moving lower extremities. Patient also has pain in my right shoulder.  Psychiatric: appears normal.  Neurologic: alert awake oriented to time place and person. Moves all extremities.      Data Reviewed: Basic Metabolic Panel:  Recent Labs Lab 05/30/14 1701 05/31/14 0315 05/31/14 2013 06/01/14 0259  NA 139 137 139 139  K 4.6 5.1 5.1 4.9  CL 101 100 103 103  CO2 24 27 24 22   GLUCOSE 227* 221* 186* 196*  BUN 18 19 25* 26*  CREATININE 0.94 1.08 1.30 1.19  CALCIUM 8.8 8.8 8.7 8.8    Liver Function Tests:  Recent Labs Lab 05/31/14 0315 06/01/14 0259  AST 17 24  ALT 12  12  ALKPHOS 94 84  BILITOT 0.4 0.5  PROT 6.9 6.7  ALBUMIN 3.3* 3.1*   No results for input(s): LIPASE, AMYLASE in the last 168 hours.  Recent Labs Lab 06/01/14 0304  AMMONIA 22    CBC:  Recent Labs Lab 05/30/14 1701 05/31/14 0315 05/31/14 2013 06/01/14 0259  WBC 9.6 9.9 9.0 8.8  NEUTROABS 7.9* 8.5*  --   --   HGB 12.4* 12.1* 11.6* 11.6*  HCT 37.9* 37.4* 35.5* 35.8*  MCV 89.4 89.5 90.1 89.5  PLT 156 151 125* 141*    Cardiac Enzymes:  Recent Labs Lab 05/30/14 1701 05/31/14 0315 05/31/14 0615 05/31/14 1235 05/31/14 1649  TROPONINI <0.30 0.87* 0.75* 0.51* 0.52*   BNP (last 3 results)  Recent Labs  05/31/14 0315  PROBNP 7931.0*     CBG:  Recent Labs Lab 06/01/14 0346 06/01/14 0813 06/01/14 1119 06/01/14 1517 06/01/14 1718  GLUCAP 196* 191* 171* 149* 157*    Recent Results (from the past 240 hour(s))  Surgical pcr screen     Status: Abnormal   Collection Time: 05/31/14  1:33 AM  Result Value Ref Range Status   MRSA, PCR NEGATIVE NEGATIVE Final   Staphylococcus aureus POSITIVE (A) NEGATIVE Final    Comment:        The Xpert SA Assay (FDA approved for NASAL specimens in patients over 78 years of age), is one component of a comprehensive surveillance program.  Test performance has been validated by [employer] for patients greater than or equal to 69 year old. It is not intended to diagnose infection nor to guide or monitor treatment. RESULT CALLED TO, READ BACK BY AND VERIFIED WITH: A WEATHERFORD,RN 05/31/14 0400 BY RHOLMES   Urine culture     Status: None   Collection Time: 05/31/14  2:06 AM  Result Value Ref Range Status   Specimen Description URINE, CATHETERIZED  Final   Special Requests NONE  Final   Culture  Setup Time   Final    05/31/2014 10:06 Performed at [facility] Performed at [facility]   Final   Culture NO GROWTH Performed at [facility]   Final   Report Status  06/01/2014 FINAL  Final     Studies: Dg Thoracic Spine 2 View  05/30/2014   CLINICAL DATA:  Fall, mid back pain  EXAM: THORACIC SPINE - 2 VIEW  COMPARISON:  None.  FINDINGS: Normal thoracic kyphosis.  No evidence of fracture dislocation. Vertebral body heights are maintained.  Moderate multilevel degenerative changes.  Vascular calcifications.  Visualized lungs are clear.  IMPRESSION: No fracture or dislocation is seen.  Moderate multilevel degenerative changes.  Electronically Signed   By: Julian Hy M.D.   On: 05/30/2014 19:23   Dg Lumbar Spine Complete  05/30/2014   CLINICAL DATA:  Fall, mid/lower back pain  EXAM: LUMBAR SPINE - COMPLETE 4+ VIEW  COMPARISON:  CT abdomen dated 08/27/2013  FINDINGS: Five lumbar type vertebral bodies.  Normal lumbar lordosis.  No evidence of fracture or dislocation. Vertebral body heights are maintained.  Grade 1 anterolisthesis of L4 on L5.  Moderate multilevel degenerative changes.  Visualized bony pelvis appears intact.  Vascular calcifications with known infrarenal abdominal aortic aneurysm.a  IMPRESSION: No fracture or dislocation is seen.  Moderate degenerative changes.  Grade 1 anterolisthesis of L4 on L5.   Electronically Signed   By: Julian Hy M.D.   On: 05/30/2014 20:01   Dg Shoulder Right  05/30/2014   CLINICAL DATA:  Fall, right shoulder pain  EXAM: RIGHT SHOULDER - 2+ VIEW  COMPARISON:  None.  FINDINGS: No fracture or dislocation is seen.  Moderate degenerative changes of the glenohumeral joint and acromioclavicular joint  The visualized soft tissues are unremarkable.  Visualized right lung is clear.  IMPRESSION: No fracture or dislocation is seen.  Moderate degenerative changes.   Electronically Signed   By: Julian Hy M.D.   On: 05/30/2014 19:22   Dg Hip Complete Left  05/30/2014   CLINICAL DATA:  Left hip pain after falling at home today at 1500 hr.  EXAM: LEFT HIP - COMPLETE 2+ VIEW  COMPARISON:  Portable pelvis obtained earlier  today.  FINDINGS: Left femoral neck fracture with mild valgus angulation. Atheromatous arterial calcifications.  IMPRESSION: Previously noted mildly displaced left femoral neck fracture with mild valgus angulation.   Electronically Signed   By: Enrique Sack M.D.   On: 05/30/2014 19:21   Ct Head Wo Contrast  05/30/2014   CLINICAL DATA:  Right neck pain after falling today and hitting his shoulder against a door jamb.  EXAM: CT HEAD WITHOUT CONTRAST  CT CERVICAL SPINE WITHOUT CONTRAST  TECHNIQUE: Multidetector CT imaging of the head and cervical spine was performed following the standard protocol without intravenous contrast. Multiplanar CT image reconstructions of the cervical spine were also generated.  COMPARISON:  10/01/2013.  FINDINGS: CT HEAD FINDINGS  Diffusely enlarged ventricles and subarachnoid spaces. Patchy white matter low density in both cerebral hemispheres. No skull fracture, intracranial hemorrhage or paranasal sinus air-fluid levels. Dense arterial calcifications at the skull base.  CT CERVICAL SPINE FINDINGS  Extensive multilevel cervical spine degenerative changes. No prevertebral soft tissue swelling, fractures or subluxations. Bilateral vertebral and carotid artery atheromatous calcifications. Bullous changes at both lung apices.  IMPRESSION: 1. No skull fracture or intracranial hemorrhage. 2. No cervical spine fracture or subluxation. 3. Stable mid atrophy and chronic small vessel white matter ischemic changes. 4. Stable marked cervical spine degenerative changes. 5. COPD.   Electronically Signed   By: Enrique Sack M.D.   On: 05/30/2014 19:07   Ct Chest W Contrast  05/30/2014   CLINICAL DATA:  [place] today. Complaining of right shoulder and left hip pain.  EXAM: CT CHEST, ABDOMEN, AND PELVIS WITH CONTRAST  TECHNIQUE: Multidetector CT imaging of the chest, abdomen and pelvis was performed following the standard protocol during bolus administration of intravenous contrast.  CONTRAST:  135mL  OMNIPAQUE IOHEXOL 300 MG/ML  SOLN  COMPARISON:  Chest CT, 08/30/2013.  FINDINGS: CT CHEST FINDINGS  Heart is mildly enlarged. There are dense coronary artery and mitral valve annular calcifications. Aorta is normal in caliber. There  is partly calcified mild plaque along the thoracic aorta and at the origin of the left subclavian artery. No significant stenosis.  Right left pulmonary arteries are dilated to 3 cm, stable.  Borderline enlarged right peritracheal lymph node measures 1 cm short axis, stable. No mediastinal masses. No hilar masses or adenopathy.  Lungs show heterogeneous coarse peripheral reticular opacities consistent with interstitial fibrosis, with a pattern suggesting UIP. Focal opacity noted in the left lower lobe on the prior exam measuring 15 mm x 7 mm, is without significant change. No acute lung consolidation or evidence of edema. No pleural effusion or pneumothorax.  CT ABDOMEN AND PELVIS FINDINGS  Liver shows morphologic changes consistent with cirrhosis. No liver mass or focal lesion. No contusion or laceration.  Mean is prominent measuring 12.4 cm in greatest dimension. No splenic mass or focal lesion.  Gallbladder is unremarkable. No bile duct dilation. No pancreatic masses or inflammation. No adrenal masses.  Mild bilateral renal cortical thinning. There is a peripherally calcified hypo attenuating, 3.1 cm mass protruding from the inferior margin of the left kidney, not imaged on the prior chest CT. This is likely a mildly complicated cyst. There is a low attenuation lesion in the upper pole left kidney, most likely a simple cyst. No other renal masses or lesions. No hydronephrosis. Normal ureters. Bladder is unremarkable.  There is an infrarenal abdominal aortic aneurysm measuring 3.7 cm in greatest anterior-posterior dimension. No evidence of rupture. Atherosclerotic changes noted along the abdominal aorta and its branch vessels.  No pathologically enlarged lymph nodes. No abnormal fluid  collections. Specifically no evidence of hemo peritoneum.  No bowel wall thickening to suggest a bowel hematoma. No bowel inflammatory changes. No mesenteric inflammation or contusion/hematoma.  MUSCULOSKELETAL:  There fractures of the right acetabulum. Fracture extends across the posterior column, extending across the medial acetabulum to the anterior,. Fracture is mildly distracted by 7 mm. There is mild fracture comminution with small fracture fragments noted between the major fracture components, at least 1 within the hip joint.  There is a fracture of the left femoral neck. This is subcapital, oblique and non comminuted. Fracture is displaced with the distal fracture component migrating superiorly by 17 mm. There is also mild varus angulation as well as significant apex anterior angulation.  No other pelvic or proximal femur fractures.  There are no other fractures.  IMPRESSION: 1. Fractures of the right acetabulum involving the anterior posterior columns. 2. Fracture of the left femoral neck, mildly displaced and angulated. 3. No other fractures. 4. No other acute findings in the chest, abdomen or pelvis. No other evidence of acute injury. 5. Chronic findings include cardiomegaly, coronary artery calcifications and changes consistent with interstitial fibrosis. Pulmonary artery dilation suggest pulmonary artery hypertension. Below the diaphragm, there changes of cirrhosis with borderline splenomegaly. Probable left renal cysts. 6. 3.7 cm infrarenal abdominal aortic aneurysm. No evidence of rupture.   Electronically Signed   By: Lajean Manes M.D.   On: 05/30/2014 22:06   Ct Cervical Spine Wo Contrast  05/30/2014   CLINICAL DATA:  Right neck pain after falling today and hitting his shoulder against a door jamb.  EXAM: CT HEAD WITHOUT CONTRAST  CT CERVICAL SPINE WITHOUT CONTRAST  TECHNIQUE: Multidetector CT imaging of the head and cervical spine was performed following the standard protocol without  intravenous contrast. Multiplanar CT image reconstructions of the cervical spine were also generated.  COMPARISON:  10/01/2013.  FINDINGS: CT HEAD FINDINGS  Diffusely enlarged ventricles and subarachnoid spaces.  Patchy white matter low density in both cerebral hemispheres. No skull fracture, intracranial hemorrhage or paranasal sinus air-fluid levels. Dense arterial calcifications at the skull base.  CT CERVICAL SPINE FINDINGS  Extensive multilevel cervical spine degenerative changes. No prevertebral soft tissue swelling, fractures or subluxations. Bilateral vertebral and carotid artery atheromatous calcifications. Bullous changes at both lung apices.  IMPRESSION: 1. No skull fracture or intracranial hemorrhage. 2. No cervical spine fracture or subluxation. 3. Stable mid atrophy and chronic small vessel white matter ischemic changes. 4. Stable marked cervical spine degenerative changes. 5. COPD.   Electronically Signed   By: Enrique Sack M.D.   On: 05/30/2014 19:07   Ct Abdomen Pelvis W Contrast  05/30/2014   CLINICAL DATA:  [place] today. Complaining of right shoulder and left hip pain.  EXAM: CT CHEST, ABDOMEN, AND PELVIS WITH CONTRAST  TECHNIQUE: Multidetector CT imaging of the chest, abdomen and pelvis was performed following the standard protocol during bolus administration of intravenous contrast.  CONTRAST:  162mL OMNIPAQUE IOHEXOL 300 MG/ML  SOLN  COMPARISON:  Chest CT, 08/30/2013.  FINDINGS: CT CHEST FINDINGS  Heart is mildly enlarged. There are dense coronary artery and mitral valve annular calcifications. Aorta is normal in caliber. There is partly calcified mild plaque along the thoracic aorta and at the origin of the left subclavian artery. No significant stenosis.  Right left pulmonary arteries are dilated to 3 cm, stable.  Borderline enlarged right peritracheal lymph node measures 1 cm short axis, stable. No mediastinal masses. No hilar masses or adenopathy.  Lungs show heterogeneous coarse peripheral  reticular opacities consistent with interstitial fibrosis, with a pattern suggesting UIP. Focal opacity noted in the left lower lobe on the prior exam measuring 15 mm x 7 mm, is without significant change. No acute lung consolidation or evidence of edema. No pleural effusion or pneumothorax.  CT ABDOMEN AND PELVIS FINDINGS  Liver shows morphologic changes consistent with cirrhosis. No liver mass or focal lesion. No contusion or laceration.  Mean is prominent measuring 12.4 cm in greatest dimension. No splenic mass or focal lesion.  Gallbladder is unremarkable. No bile duct dilation. No pancreatic masses or inflammation. No adrenal masses.  Mild bilateral renal cortical thinning. There is a peripherally calcified hypo attenuating, 3.1 cm mass protruding from the inferior margin of the left kidney, not imaged on the prior chest CT. This is likely a mildly complicated cyst. There is a low attenuation lesion in the upper pole left kidney, most likely a simple cyst. No other renal masses or lesions. No hydronephrosis. Normal ureters. Bladder is unremarkable.  There is an infrarenal abdominal aortic aneurysm measuring 3.7 cm in greatest anterior-posterior dimension. No evidence of rupture. Atherosclerotic changes noted along the abdominal aorta and its branch vessels.  No pathologically enlarged lymph nodes. No abnormal fluid collections. Specifically no evidence of hemo peritoneum.  No bowel wall thickening to suggest a bowel hematoma. No bowel inflammatory changes. No mesenteric inflammation or contusion/hematoma.  MUSCULOSKELETAL:  There fractures of the right acetabulum. Fracture extends across the posterior column, extending across the medial acetabulum to the anterior,. Fracture is mildly distracted by 7 mm. There is mild fracture comminution with small fracture fragments noted between the major fracture components, at least 1 within the hip joint.  There is a fracture of the left femoral neck. This is subcapital,  oblique and non comminuted. Fracture is displaced with the distal fracture component migrating superiorly by 17 mm. There is also mild varus angulation as well as significant apex  anterior angulation.  No other pelvic or proximal femur fractures.  There are no other fractures.  IMPRESSION: 1. Fractures of the right acetabulum involving the anterior posterior columns. 2. Fracture of the left femoral neck, mildly displaced and angulated. 3. No other fractures. 4. No other acute findings in the chest, abdomen or pelvis. No other evidence of acute injury. 5. Chronic findings include cardiomegaly, coronary artery calcifications and changes consistent with interstitial fibrosis. Pulmonary artery dilation suggest pulmonary artery hypertension. Below the diaphragm, there changes of cirrhosis with borderline splenomegaly. Probable left renal cysts. 6. 3.7 cm infrarenal abdominal aortic aneurysm. No evidence of rupture.   Electronically Signed   By: Lajean Manes M.D.   On: 05/30/2014 22:06   Pelvis Portable  06/01/2014   CLINICAL DATA:  Postop left total hip.  EXAM: PORTABLE PELVIS 1-2 VIEWS  COMPARISON:  05/31/2014  FINDINGS: Patient has undergone total hip arthroplasty. Left hip prosthesis appears well seated. No evidence for dislocation on this frontal view. There is asymmetry of the SI joint, likely related to pelvic fractures. Right acetabular fracture again noted. There is deformity of the right inferior pubic ramus.  IMPRESSION: 1. Status post left total hip arthroplasty. 2. No evidence for dislocation on this frontal view.   Electronically Signed   By: Shon Hale M.D.   On: 06/01/2014 17:07   Dg Pelvis Portable  05/30/2014   CLINICAL DATA:  [place] today at 3 o'clock. Tripped over a door way. Left hip pain.  EXAM: PORTABLE PELVIS 1-2 VIEWS PORTABLE ONE-VIEW CHEST X-RAY.  COMPARISON:  HIP RADIOGRAPHS 10/01/2013 AND CHEST X-RAY 08/26/2013.  FINDINGS: CHEST X-RAY:  The heart is mildly enlarged but stable. There is  tortuosity and calcification of the thoracic aorta. Chronic lung changes with pulmonary fibrosis. No definite acute overlying pulmonary process. The bony thorax is grossly intact.  Pelvis:  There is a displaced left femoral neck fracture. The right hip is intact. Moderate degenerative changes bilaterally, right greater than left. The pubic symphysis and SI joints are intact. No pelvic fractures. Extensive vascular calcifications are noted.  IMPRESSION: 1. Displaced left femoral neck fracture. 2. No acute cardiopulmonary findings. Chronic lung changes/pulmonary fibrosis.   Electronically Signed   By: Kalman Jewels M.D.   On: 05/30/2014 17:36   Dg Pelvis Comp Min 3v  05/31/2014   CLINICAL DATA:  Right acetabular fracture  EXAM: JUDET PELVIS - 3+ VIEW  COMPARISON:  05/30/2014  FINDINGS: Again noted changes consistent with a left femoral neck fracture with impaction. The right acetabular fracture is again noted and unchanged. No right femoral abnormality seen.  IMPRESSION: Right acetabular fracture stable from the prior exam.  Left femoral neck fracture stable from the prior exam.   Electronically Signed   By: Inez Catalina M.D.   On: 05/31/2014 11:34   Ct 3d Recon At Scanner  05/31/2014   CLINICAL DATA:  Nonspecific (abnormal) findings on radiological and other examination of musculoskeletal system. Right acetabular fracture. Left femoral neck fracture.  EXAM: 3-DIMENSIONAL CT IMAGE RENDERING ON ACQUISITION WORKSTATION  TECHNIQUE: 3-dimensional CT images were rendered by post-processing of the original CT data on an acquisition workstation.  COMPARISON:  CT scan and radiographs dated 05/30/2014  FINDINGS: There is a oblique vertical fracture through the medial aspect of the right acetabulum the with approximately 8 mm of distraction. There is also a subtle old healed fracture of the right inferior pubic ramus. I do not think these are acute.  There is an acute subcapital fracture of  the left femoral neck with  impaction and angulation. There are slight arthritic changes of the superior lateral aspect of the left acetabulum.  The 3D images better demonstrate the right acetabular fracture.  IMPRESSION: Slightly distracted subacute fracture through the medial aspect of the right acetabulum. Old healed fracture of the low right inferior pubic ramus.   Electronically Signed   By: Rozetta Nunnery M.D.   On: 05/31/2014 11:37   Dg Chest Port 1 View  05/31/2014   CLINICAL DATA:  80 year old male with low oxygen level. Subsequent encounter.  EXAM: PORTABLE CHEST - 1 VIEW  COMPARISON:  05/30/2014 and 08/26/2013.  FINDINGS: Pulmonary vascular congestion superimposed upon chronic lung changes.  Elevated left hemidiaphragm with left base subsegmental atelectasis.  No gross pneumothorax.  Cardiomegaly.  Calcified mildly tortuous aorta.  IMPRESSION: Pulmonary vascular congestion superimposed upon chronic lung changes.  Elevated left hemidiaphragm with left base subsegmental atelectasis.  No gross pneumothorax.  Cardiomegaly.  Calcified mildly tortuous aorta.   Electronically Signed   By: Chauncey Cruel M.D.   On: 05/31/2014 06:55   Dg Chest Port 1 View  05/30/2014   CLINICAL DATA:  80 year old male who fell at 1500 hr today tripping over door way. Acute pain. Initial encounter.  EXAM: PORTABLE CHEST - 1 VIEW  COMPARISON:  Chest CT 08/30/2013 and earlier.  FINDINGS: Portable AP semi upright view at 1716 hr. Lower lung volumes. Stable cardiomegaly and mediastinal contours. Peripheral increased opacity greater in the left lung is stable. No pneumothorax, pulmonary edema, or pleural effusion identified. No acute pulmonary opacity identified. Calcified atherosclerosis of the aorta. Visualized tracheal air column is within normal limits. No acute osseous abnormality identified.  IMPRESSION: Cardiomegaly and chronic lung disease. No superimposed acute findings are identified.   Electronically Signed   By: Lars Pinks M.D.   On: 05/30/2014 17:35     Scheduled Meds: . acetaminophen  1,000 mg Oral 4 times per day  . aspirin EC  81 mg Oral Daily  . cefTRIAXone (ROCEPHIN)  IV  1 g Intravenous Q24H  . cholecalciferol  1,000 Units Oral Daily  . docusate sodium  100 mg Oral BID  . [START ON 06/02/2014] enoxaparin (LOVENOX) injection  40 mg Subcutaneous Q24H  . finasteride  5 mg Oral Daily  . insulin aspart  0-9 Units Subcutaneous TID WC  . omega-3 acid ethyl esters  1,000 mg Oral BID  . rosuvastatin  10 mg Oral Daily  . senna  1 tablet Oral BID  . sotalol  80 mg Oral BID   Continuous Infusions: . sodium chloride 75 mL/hr at 05/31/14 0927  . lactated ringers 50 mL/hr at 06/01/14 1205    Principal Problem:   Hip fracture, left Active Problems:   Interstitial lung disease   Hyperlipidemia   History of atrial fibrillation   Diabetes mellitus, controlled   Hip fracture   COPD exacerbation   Elevated troponin   Personal history of other diseases of circulatory system    Time spent: 30 minutes   [facility]  [facility] Pager [phone]. If 7PM-7AM, please contact night-coverage at [email or web address], password [facility] 06/01/2014, 5:53 PM  LOS: 2 days

## 2014-06-01 NOTE — Progress Notes (Addendum)
     Subjective:  Patient confused, thinks he is at a cigarette machine.    Objective:   VITALS:   Filed Vitals:   06/01/14 0330 06/01/14 0400 06/01/14 0500 06/01/14 0809  BP: 147/47   149/67  Pulse: 83 42 84 79  Temp: 98.5 F (36.9 C)   98 F (36.7 C)  TempSrc: Oral   Oral  Resp: 28 31 23 22   Height:      Weight:      SpO2: 94% 95% 92% 94%    EHL, FHL firing.  Positive log roll on left.  Did not move right.    Lab Results  Component Value Date   WBC 8.8 06/01/2014   HGB 11.6* 06/01/2014   HCT 35.8* 06/01/2014   MCV 89.5 06/01/2014   PLT 141* 06/01/2014   BMET    Component Value Date/Time   NA 139 06/01/2014 0259   K 4.9 06/01/2014 0259   CL 103 06/01/2014 0259   CO2 22 06/01/2014 0259   GLUCOSE 196* 06/01/2014 0259   BUN 26* 06/01/2014 0259   CREATININE 1.19 06/01/2014 0259   CALCIUM 8.8 06/01/2014 0259   GFRNONAA 54* 06/01/2014 0259   GFRAA 62* 06/01/2014 0259     Assessment/Plan:     Principal Problem:   Hip fracture, left Active Problems:   Interstitial lung disease   Hyperlipidemia   History of atrial fibrillation   Diabetes mellitus, controlled   Hip fracture   COPD exacerbation  He is extremely high risk for consideration of surgery.  Family has called me and requested he have his hip fixed as soon as possible, today if at all possible.    He is mildly acidotic, continues to require oxygen, with evidence for demand ischemia from a cardiac standpoint.  He has extremely high mortality risk at this point.    I am not sure if waiting reduces or increases his risk.  The longer we wait, the worse his pulmonary toilet and decreased mobility.  However, I am not sure if he can stand the physiological insult of surgery.    If cardiology and pulmonology feel that there is any benefit to waiting, we can schedule his surgery for thurs or fri, but if they feel there is not likely to be any further optimization, then we will proceed with surgery today.     Will keep him npo for now.  ADDENDUM:  Spoke with OR and family, there is time for me to do this later today around 12:15, and family has accepted the risks of mortality and wishes to proceed.  His level of misery is unacceptable to them and they want something done surgically.  Pulmonology and Cardiology discussions and optimizations have been completed.    Anarosa Kubisiak P 06/01/2014, 8:26 AM   Marchia Bond, MD Cell [phone]

## 2014-06-01 NOTE — Anesthesia Postprocedure Evaluation (Signed)
  Anesthesia Post-op Note  Patient: [name] [name]  Procedure(s) Performed: Procedure(s): ARTHROPLASTY BIPOLAR HIP (Left)  Patient Location: PACU  Anesthesia Type:General  Level of Consciousness: awake  Airway and Oxygen Therapy: Patient Spontanous Breathing  Post-op Pain: mild  Post-op Assessment: Post-op Vital signs reviewed  Post-op Vital Signs: Reviewed  Last Vitals:  Filed Vitals:   06/01/14 1615  BP: 119/53  Pulse: 81  Temp: 36.4 C  Resp: 27    Complications: No apparent anesthesia complications

## 2014-06-01 NOTE — Op Note (Signed)
05/30/2014 - 06/01/2014  2:27 PM  PATIENT:  [name] [name]   MRN: [id number]  PRE-OPERATIVE DIAGNOSIS:  left femoral neck fracture  POST-OPERATIVE DIAGNOSIS:  left femoral neck fracture  PROCEDURE:  Procedure(s): ARTHROPLASTY BIPOLAR HIP  PREOPERATIVE INDICATIONS:  [name] [name] is an 80 y.o. male who was admitted 05/30/2014 with a diagnosis of Hip fracture, left and elected for surgical management.  The risks benefits and alternatives were discussed with the patient including but not limited to the risks of nonoperative treatment, versus surgical intervention including infection, bleeding, nerve injury, periprosthetic fracture, the need for revision surgery, dislocation, leg length discrepancy, blood clots, cardiopulmonary complications, morbidity, mortality, among others, and they were willing to proceed.  Predicted outcome is good, although there will be at least a six to nine month expected recovery. Preoperatively he was delirius, and metabolically very challenged, and after discussion with family and consultation with pulmonology and cardiology, it was felt that proceeding with surgical intervention was the best course of action, and that more time would only increase his perioperative risks.  OPERATIVE REPORT     SURGEON:  Marchia Bond, MD    ASSISTANT:  Joya Gaskins, OPA-C  (Present throughout the entire procedure,  necessary for completion of procedure in a timely manner, assisting with retraction, instrumentation, and closure)     ANESTHESIA:  spinal    COMPLICATIONS:  None.     Unique aspects of the case: The stem was between a size 5 and a size 6, however the size 6 broach could not be placed fully down, so I went the size 5. Impaction of the real prosthesis had excellent rotational stability. It had a solid press-fit feel.  COMPONENTS:  [employer] Basic Femoral Fracture stem size 5, with a 52 spacer and a size 0 fracture head unipolar hip ball.    PROCEDURE IN DETAIL: The  patient was met in the holding area and identified.  The appropriate hip  was marked at the operative site. The patient was then transported to the OR and  placed under general anesthesia.  At that point, the patient was  placed in the lateral decubitus position with the operative side up and  secured to the operating room table and all bony prominences padded.     The operative lower extremity was prepped from the iliac crest to the toes.  Sterile draping was performed.  Time out was performed prior to incision.      A routine posterolateral approach was utilized via sharp dissection  carried down to the subcutaneous tissue.  Gross bleeders were Bovie  coagulated.  The iliotibial band was identified and incised  along the length of the skin incision.  Self-retaining retractors were  inserted.  With the hip internally rotated, the short external rotators  were identified. The piriformis was tagged with FiberWire, and the hip capsule released in a T-type fashion.  The femoral neck was exposed, and I resected the femoral neck using the appropriate jig. This was performed at approximately a thumb's breadth above the lesser trochanter.    I then exposed the deep acetabulum, cleared out any tissue including the ligamentum teres, and included the hip capsule in the FiberWire used above and below the T.    I then prepared the proximal femur using the cookie-cutter, the lateralizing reamer, and then sequentially broached.  A trial utilized, and I reduced the hip and it was found to have excellent stability with functional range of motion. The trial components were then removed.  I placed the real femoral stem down, and impacted this with excellent fixation, and placed the real head ball.  The hip was then reduced and taken through functional range of motion and found to have excellent stability. Leg lengths were restored.  I then used a 2 mm drill bits to pass the FiberWire suture from the capsule and  puriform is through the greater trochanter, and secured this. Excellent posterior capsular repair was achieved. I also closed the T in the capsule.  I then irrigated the hip copiously again with pulse lavage, and repaired the fascia with Vicryl, followed by Vicryl for the subcutaneous tissue, Monocryl for the skin, Steri-Strips and sterile gauze. The wounds were injected. The patient was then awakened and returned to PACU in stable and satisfactory condition. There were no complications.  Marchia Bond, MD Orthopedic Surgeon [phone]   06/01/2014 2:27 PM

## 2014-06-01 NOTE — Progress Notes (Signed)
SUBJECTIVE:  Pt seen and examined in AM. Pt with reported agitation overnight. Also with soft blood pressure (93/58) that improved. Telemetry with no events. His family member at bedside reports he has not had any chest pain. He reports he is scheduled to have hip surgery later today.     OBJECTIVE:   Vitals:   Filed Vitals:   06/01/14 0330 06/01/14 0400 06/01/14 0500 06/01/14 0809  BP: 147/47   149/67  Pulse: 83 42 84 79  Temp: 98.5 F (36.9 C)   98 F (36.7 C)  TempSrc: Oral   Oral  Resp: 28 31 23 22   Height:      Weight:      SpO2: 94% 95% 92% 94%   I&O's:   Intake/Output Summary (Last 24 hours) at 06/01/14 1041 Last data filed at 06/01/14 1000  Gross per 24 hour  Intake 1876.83 ml  Output    985 ml  Net 891.83 ml   TELEMETRY: Reviewed telemetry pt in NSR:     PHYSICAL EXAM General: Well developed, well nourished, in no acute distress Head:   Normal cephalic and atramatic  Lungs:   Clear bilaterally to auscultation. Heart:  HRRR S1 S2  No JVD.   Abdomen: abdomen soft and non-tender Msk:  Back normal,  Normal strength and tone for age. Extremities:   No edema.   Neuro: Alert, confused  Psych: Possible hallucinations Skin: No rash   LABS: Basic Metabolic Panel:  Recent Labs  05/31/14 2013 06/01/14 0259  NA 139 139  K 5.1 4.9  CL 103 103  CO2 24 22  GLUCOSE 186* 196*  BUN 25* 26*  CREATININE 1.30 1.19  CALCIUM 8.7 8.8   Liver Function Tests:  Recent Labs  05/31/14 0315 06/01/14 0259  AST 17 24  ALT 12 12  ALKPHOS 94 84  BILITOT 0.4 0.5  PROT 6.9 6.7  ALBUMIN 3.3* 3.1*   No results for input(s): LIPASE, AMYLASE in the last 72 hours. CBC:  Recent Labs  05/30/14 1701 05/31/14 0315 05/31/14 2013 06/01/14 0259  WBC 9.6 9.9 9.0 8.8  NEUTROABS 7.9* 8.5*  --   --   HGB 12.4* 12.1* 11.6* 11.6*  HCT 37.9* 37.4* 35.5* 35.8*  MCV 89.4 89.5 90.1 89.5  PLT 156 151 125* 141*   Cardiac Enzymes:  Recent Labs  05/31/14 0615  05/31/14 1235 05/31/14 1649  TROPONINI 0.75* 0.51* 0.52*   BNP: Invalid input(s): POCBNP D-Dimer: No results for input(s): DDIMER in the last 72 hours. Hemoglobin A1C: No results for input(s): HGBA1C in the last 72 hours. Fasting Lipid Panel: No results for input(s): CHOL, HDL, LDLCALC, TRIG, CHOLHDL, LDLDIRECT in the last 72 hours. Thyroid Function Tests: No results for input(s): TSH, T4TOTAL, T3FREE, THYROIDAB in the last 72 hours.  Invalid input(s): FREET3 Anemia Panel: No results for input(s): VITAMINB12, FOLATE, FERRITIN, TIBC, IRON, RETICCTPCT in the last 72 hours. Coag Panel:   Lab Results  Component Value Date   INR 1.06 05/31/2014   INR 1.06 10/01/2013    RADIOLOGY: Dg Thoracic Spine 2 View  05/30/2014   CLINICAL DATA:  Fall, mid back pain  EXAM: THORACIC SPINE - 2 VIEW  COMPARISON:  None.  FINDINGS: Normal thoracic kyphosis.  No evidence of fracture dislocation. Vertebral body heights are maintained.  Moderate multilevel degenerative changes.  Vascular calcifications.  Visualized lungs are clear.  IMPRESSION: No fracture or dislocation is seen.  Moderate multilevel degenerative changes.   Electronically Signed   By:  Julian Hy M.D.   On: 05/30/2014 19:23   Dg Lumbar Spine Complete  05/30/2014   CLINICAL DATA:  Fall, mid/lower back pain  EXAM: LUMBAR SPINE - COMPLETE 4+ VIEW  COMPARISON:  CT abdomen dated 08/27/2013  FINDINGS: Five lumbar type vertebral bodies.  Normal lumbar lordosis.  No evidence of fracture or dislocation. Vertebral body heights are maintained.  Grade 1 anterolisthesis of L4 on L5.  Moderate multilevel degenerative changes.  Visualized bony pelvis appears intact.  Vascular calcifications with known infrarenal abdominal aortic aneurysm.a  IMPRESSION: No fracture or dislocation is seen.  Moderate degenerative changes.  Grade 1 anterolisthesis of L4 on L5.   Electronically Signed   By: Julian Hy M.D.   On: 05/30/2014 20:01   Dg Shoulder  Right  05/30/2014   CLINICAL DATA:  Fall, right shoulder pain  EXAM: RIGHT SHOULDER - 2+ VIEW  COMPARISON:  None.  FINDINGS: No fracture or dislocation is seen.  Moderate degenerative changes of the glenohumeral joint and acromioclavicular joint  The visualized soft tissues are unremarkable.  Visualized right lung is clear.  IMPRESSION: No fracture or dislocation is seen.  Moderate degenerative changes.   Electronically Signed   By: Julian Hy M.D.   On: 05/30/2014 19:22   Dg Hip Complete Left  05/30/2014   CLINICAL DATA:  Left hip pain after falling at home today at 1500 hr.  EXAM: LEFT HIP - COMPLETE 2+ VIEW  COMPARISON:  Portable pelvis obtained earlier today.  FINDINGS: Left femoral neck fracture with mild valgus angulation. Atheromatous arterial calcifications.  IMPRESSION: Previously noted mildly displaced left femoral neck fracture with mild valgus angulation.   Electronically Signed   By: Enrique Sack M.D.   On: 05/30/2014 19:21   Ct Head Wo Contrast  05/30/2014   CLINICAL DATA:  Right neck pain after falling today and hitting his shoulder against a door jamb.  EXAM: CT HEAD WITHOUT CONTRAST  CT CERVICAL SPINE WITHOUT CONTRAST  TECHNIQUE: Multidetector CT imaging of the head and cervical spine was performed following the standard protocol without intravenous contrast. Multiplanar CT image reconstructions of the cervical spine were also generated.  COMPARISON:  10/01/2013.  FINDINGS: CT HEAD FINDINGS  Diffusely enlarged ventricles and subarachnoid spaces. Patchy white matter low density in both cerebral hemispheres. No skull fracture, intracranial hemorrhage or paranasal sinus air-fluid levels. Dense arterial calcifications at the skull base.  CT CERVICAL SPINE FINDINGS  Extensive multilevel cervical spine degenerative changes. No prevertebral soft tissue swelling, fractures or subluxations. Bilateral vertebral and carotid artery atheromatous calcifications. Bullous changes at both lung apices.   IMPRESSION: 1. No skull fracture or intracranial hemorrhage. 2. No cervical spine fracture or subluxation. 3. Stable mid atrophy and chronic small vessel white matter ischemic changes. 4. Stable marked cervical spine degenerative changes. 5. COPD.   Electronically Signed   By: Enrique Sack M.D.   On: 05/30/2014 19:07   Ct Chest W Contrast  05/30/2014   CLINICAL DATA:  [place] today. Complaining of right shoulder and left hip pain.  EXAM: CT CHEST, ABDOMEN, AND PELVIS WITH CONTRAST  TECHNIQUE: Multidetector CT imaging of the chest, abdomen and pelvis was performed following the standard protocol during bolus administration of intravenous contrast.  CONTRAST:  147mL OMNIPAQUE IOHEXOL 300 MG/ML  SOLN  COMPARISON:  Chest CT, 08/30/2013.  FINDINGS: CT CHEST FINDINGS  Heart is mildly enlarged. There are dense coronary artery and mitral valve annular calcifications. Aorta is normal in caliber. There is partly calcified mild plaque  along the thoracic aorta and at the origin of the left subclavian artery. No significant stenosis.  Right left pulmonary arteries are dilated to 3 cm, stable.  Borderline enlarged right peritracheal lymph node measures 1 cm short axis, stable. No mediastinal masses. No hilar masses or adenopathy.  Lungs show heterogeneous coarse peripheral reticular opacities consistent with interstitial fibrosis, with a pattern suggesting UIP. Focal opacity noted in the left lower lobe on the prior exam measuring 15 mm x 7 mm, is without significant change. No acute lung consolidation or evidence of edema. No pleural effusion or pneumothorax.  CT ABDOMEN AND PELVIS FINDINGS  Liver shows morphologic changes consistent with cirrhosis. No liver mass or focal lesion. No contusion or laceration.  Mean is prominent measuring 12.4 cm in greatest dimension. No splenic mass or focal lesion.  Gallbladder is unremarkable. No bile duct dilation. No pancreatic masses or inflammation. No adrenal masses.  Mild bilateral renal  cortical thinning. There is a peripherally calcified hypo attenuating, 3.1 cm mass protruding from the inferior margin of the left kidney, not imaged on the prior chest CT. This is likely a mildly complicated cyst. There is a low attenuation lesion in the upper pole left kidney, most likely a simple cyst. No other renal masses or lesions. No hydronephrosis. Normal ureters. Bladder is unremarkable.  There is an infrarenal abdominal aortic aneurysm measuring 3.7 cm in greatest anterior-posterior dimension. No evidence of rupture. Atherosclerotic changes noted along the abdominal aorta and its branch vessels.  No pathologically enlarged lymph nodes. No abnormal fluid collections. Specifically no evidence of hemo peritoneum.  No bowel wall thickening to suggest a bowel hematoma. No bowel inflammatory changes. No mesenteric inflammation or contusion/hematoma.  MUSCULOSKELETAL:  There fractures of the right acetabulum. Fracture extends across the posterior column, extending across the medial acetabulum to the anterior,. Fracture is mildly distracted by 7 mm. There is mild fracture comminution with small fracture fragments noted between the major fracture components, at least 1 within the hip joint.  There is a fracture of the left femoral neck. This is subcapital, oblique and non comminuted. Fracture is displaced with the distal fracture component migrating superiorly by 17 mm. There is also mild varus angulation as well as significant apex anterior angulation.  No other pelvic or proximal femur fractures.  There are no other fractures.  IMPRESSION: 1. Fractures of the right acetabulum involving the anterior posterior columns. 2. Fracture of the left femoral neck, mildly displaced and angulated. 3. No other fractures. 4. No other acute findings in the chest, abdomen or pelvis. No other evidence of acute injury. 5. Chronic findings include cardiomegaly, coronary artery calcifications and changes consistent with  interstitial fibrosis. Pulmonary artery dilation suggest pulmonary artery hypertension. Below the diaphragm, there changes of cirrhosis with borderline splenomegaly. Probable left renal cysts. 6. 3.7 cm infrarenal abdominal aortic aneurysm. No evidence of rupture.   Electronically Signed   By: Lajean Manes M.D.   On: 05/30/2014 22:06   Ct Cervical Spine Wo Contrast  05/30/2014   CLINICAL DATA:  Right neck pain after falling today and hitting his shoulder against a door jamb.  EXAM: CT HEAD WITHOUT CONTRAST  CT CERVICAL SPINE WITHOUT CONTRAST  TECHNIQUE: Multidetector CT imaging of the head and cervical spine was performed following the standard protocol without intravenous contrast. Multiplanar CT image reconstructions of the cervical spine were also generated.  COMPARISON:  10/01/2013.  FINDINGS: CT HEAD FINDINGS  Diffusely enlarged ventricles and subarachnoid spaces. Patchy white matter low density  in both cerebral hemispheres. No skull fracture, intracranial hemorrhage or paranasal sinus air-fluid levels. Dense arterial calcifications at the skull base.  CT CERVICAL SPINE FINDINGS  Extensive multilevel cervical spine degenerative changes. No prevertebral soft tissue swelling, fractures or subluxations. Bilateral vertebral and carotid artery atheromatous calcifications. Bullous changes at both lung apices.  IMPRESSION: 1. No skull fracture or intracranial hemorrhage. 2. No cervical spine fracture or subluxation. 3. Stable mid atrophy and chronic small vessel white matter ischemic changes. 4. Stable marked cervical spine degenerative changes. 5. COPD.   Electronically Signed   By: Enrique Sack M.D.   On: 05/30/2014 19:07   Ct Abdomen Pelvis W Contrast  05/30/2014   CLINICAL DATA:  [place] today. Complaining of right shoulder and left hip pain.  EXAM: CT CHEST, ABDOMEN, AND PELVIS WITH CONTRAST  TECHNIQUE: Multidetector CT imaging of the chest, abdomen and pelvis was performed following the standard protocol  during bolus administration of intravenous contrast.  CONTRAST:  125mL OMNIPAQUE IOHEXOL 300 MG/ML  SOLN  COMPARISON:  Chest CT, 08/30/2013.  FINDINGS: CT CHEST FINDINGS  Heart is mildly enlarged. There are dense coronary artery and mitral valve annular calcifications. Aorta is normal in caliber. There is partly calcified mild plaque along the thoracic aorta and at the origin of the left subclavian artery. No significant stenosis.  Right left pulmonary arteries are dilated to 3 cm, stable.  Borderline enlarged right peritracheal lymph node measures 1 cm short axis, stable. No mediastinal masses. No hilar masses or adenopathy.  Lungs show heterogeneous coarse peripheral reticular opacities consistent with interstitial fibrosis, with a pattern suggesting UIP. Focal opacity noted in the left lower lobe on the prior exam measuring 15 mm x 7 mm, is without significant change. No acute lung consolidation or evidence of edema. No pleural effusion or pneumothorax.  CT ABDOMEN AND PELVIS FINDINGS  Liver shows morphologic changes consistent with cirrhosis. No liver mass or focal lesion. No contusion or laceration.  Mean is prominent measuring 12.4 cm in greatest dimension. No splenic mass or focal lesion.  Gallbladder is unremarkable. No bile duct dilation. No pancreatic masses or inflammation. No adrenal masses.  Mild bilateral renal cortical thinning. There is a peripherally calcified hypo attenuating, 3.1 cm mass protruding from the inferior margin of the left kidney, not imaged on the prior chest CT. This is likely a mildly complicated cyst. There is a low attenuation lesion in the upper pole left kidney, most likely a simple cyst. No other renal masses or lesions. No hydronephrosis. Normal ureters. Bladder is unremarkable.  There is an infrarenal abdominal aortic aneurysm measuring 3.7 cm in greatest anterior-posterior dimension. No evidence of rupture. Atherosclerotic changes noted along the abdominal aorta and its  branch vessels.  No pathologically enlarged lymph nodes. No abnormal fluid collections. Specifically no evidence of hemo peritoneum.  No bowel wall thickening to suggest a bowel hematoma. No bowel inflammatory changes. No mesenteric inflammation or contusion/hematoma.  MUSCULOSKELETAL:  There fractures of the right acetabulum. Fracture extends across the posterior column, extending across the medial acetabulum to the anterior,. Fracture is mildly distracted by 7 mm. There is mild fracture comminution with small fracture fragments noted between the major fracture components, at least 1 within the hip joint.  There is a fracture of the left femoral neck. This is subcapital, oblique and non comminuted. Fracture is displaced with the distal fracture component migrating superiorly by 17 mm. There is also mild varus angulation as well as significant apex anterior angulation.  No other  pelvic or proximal femur fractures.  There are no other fractures.  IMPRESSION: 1. Fractures of the right acetabulum involving the anterior posterior columns. 2. Fracture of the left femoral neck, mildly displaced and angulated. 3. No other fractures. 4. No other acute findings in the chest, abdomen or pelvis. No other evidence of acute injury. 5. Chronic findings include cardiomegaly, coronary artery calcifications and changes consistent with interstitial fibrosis. Pulmonary artery dilation suggest pulmonary artery hypertension. Below the diaphragm, there changes of cirrhosis with borderline splenomegaly. Probable left renal cysts. 6. 3.7 cm infrarenal abdominal aortic aneurysm. No evidence of rupture.   Electronically Signed   By: Lajean Manes M.D.   On: 05/30/2014 22:06   Dg Pelvis Portable  05/30/2014   CLINICAL DATA:  [place] today at 3 o'clock. Tripped over a door way. Left hip pain.  EXAM: PORTABLE PELVIS 1-2 VIEWS PORTABLE ONE-VIEW CHEST X-RAY.  COMPARISON:  HIP RADIOGRAPHS 10/01/2013 AND CHEST X-RAY 08/26/2013.  FINDINGS: CHEST  X-RAY:  The heart is mildly enlarged but stable. There is tortuosity and calcification of the thoracic aorta. Chronic lung changes with pulmonary fibrosis. No definite acute overlying pulmonary process. The bony thorax is grossly intact.  Pelvis:  There is a displaced left femoral neck fracture. The right hip is intact. Moderate degenerative changes bilaterally, right greater than left. The pubic symphysis and SI joints are intact. No pelvic fractures. Extensive vascular calcifications are noted.  IMPRESSION: 1. Displaced left femoral neck fracture. 2. No acute cardiopulmonary findings. Chronic lung changes/pulmonary fibrosis.   Electronically Signed   By: Kalman Jewels M.D.   On: 05/30/2014 17:36   Dg Pelvis Comp Min 3v  05/31/2014   CLINICAL DATA:  Right acetabular fracture  EXAM: JUDET PELVIS - 3+ VIEW  COMPARISON:  05/30/2014  FINDINGS: Again noted changes consistent with a left femoral neck fracture with impaction. The right acetabular fracture is again noted and unchanged. No right femoral abnormality seen.  IMPRESSION: Right acetabular fracture stable from the prior exam.  Left femoral neck fracture stable from the prior exam.   Electronically Signed   By: Inez Catalina M.D.   On: 05/31/2014 11:34   Ct 3d Recon At Scanner  05/31/2014   CLINICAL DATA:  Nonspecific (abnormal) findings on radiological and other examination of musculoskeletal system. Right acetabular fracture. Left femoral neck fracture.  EXAM: 3-DIMENSIONAL CT IMAGE RENDERING ON ACQUISITION WORKSTATION  TECHNIQUE: 3-dimensional CT images were rendered by post-processing of the original CT data on an acquisition workstation.  COMPARISON:  CT scan and radiographs dated 05/30/2014  FINDINGS: There is a oblique vertical fracture through the medial aspect of the right acetabulum the with approximately 8 mm of distraction. There is also a subtle old healed fracture of the right inferior pubic ramus. I do not think these are acute.  There is an  acute subcapital fracture of the left femoral neck with impaction and angulation. There are slight arthritic changes of the superior lateral aspect of the left acetabulum.  The 3D images better demonstrate the right acetabular fracture.  IMPRESSION: Slightly distracted subacute fracture through the medial aspect of the right acetabulum. Old healed fracture of the low right inferior pubic ramus.   Electronically Signed   By: Rozetta Nunnery M.D.   On: 05/31/2014 11:37   Dg Chest Port 1 View  05/31/2014   CLINICAL DATA:  80 year old male with low oxygen level. Subsequent encounter.  EXAM: PORTABLE CHEST - 1 VIEW  COMPARISON:  05/30/2014 and 08/26/2013.  FINDINGS: Pulmonary vascular  congestion superimposed upon chronic lung changes.  Elevated left hemidiaphragm with left base subsegmental atelectasis.  No gross pneumothorax.  Cardiomegaly.  Calcified mildly tortuous aorta.  IMPRESSION: Pulmonary vascular congestion superimposed upon chronic lung changes.  Elevated left hemidiaphragm with left base subsegmental atelectasis.  No gross pneumothorax.  Cardiomegaly.  Calcified mildly tortuous aorta.   Electronically Signed   By: Chauncey Cruel M.D.   On: 05/31/2014 06:55   Dg Chest Port 1 View  05/30/2014   CLINICAL DATA:  80 year old male who fell at 1500 hr today tripping over door way. Acute pain. Initial encounter.  EXAM: PORTABLE CHEST - 1 VIEW  COMPARISON:  Chest CT 08/30/2013 and earlier.  FINDINGS: Portable AP semi upright view at 1716 hr. Lower lung volumes. Stable cardiomegaly and mediastinal contours. Peripheral increased opacity greater in the left lung is stable. No pneumothorax, pulmonary edema, or pleural effusion identified. No acute pulmonary opacity identified. Calcified atherosclerosis of the aorta. Visualized tracheal air column is within normal limits. No acute osseous abnormality identified.  IMPRESSION: Cardiomegaly and chronic lung disease. No superimposed acute findings are identified.    Electronically Signed   By: Lars Pinks M.D.   On: 05/30/2014 17:35      ASSESSMENT: 80-yo man with pmh of HTN, HL, T2DM, AAA, COPD, Afib s/p ablation who presented on 11/2 with left displaced femoral neck fracture in setting of mechanical fall found to have NSTEMI.   PLAN:    Type II NTEMI - Currently with no CP. Pt with mildly elevated cardiac enzymes in setting of new inferior lateral T wave inversion most likely demand ischemia in setting of acute hypoxic respiratory failure and recent left hip fracture. On aspirin 81 mg daily and  rosuvastatin 10 mg daily.  Atrial Fibrillation s/p ablation - Currently in NSR. On home sotalol 80 mg BID. Left Femoral Neck Fracture - Ortho following. Pt with calculated 0.9% perioperative cardiac risk Lyn[employer] Safe risk calculator). He is currently optimized from a cardiac standpoint. No invasive cardiac intervention at this time as his current clinical picture is consistent with Type II demand ischemia. 2D-echo with grade 1 diastolic dysfunction with normal EF 50-55%. Consider IV lasix for pulmonary venous congestion.  Grade 1 diastolic dysfunction - Pro-BNP elevated at >7000 and CXR with pulmonary congestion. Consider IV lasix.  Acute hypoxic respiratory failure in setting of COPD - Currently on oxygen therapy with no acute exacerbation. CXR with pulmonary venous congestion in setting of grade 1 diastolic dysfunction. Consider IV lasix. Currently on NS 75 mL/hr. Hypertension - Currently mildly hypertensive. Hold BP meds for upcoming surgery.  Hyperlipidemia - No recent lipid panel. Continue home rosuvastatin 10 mg daily.  Vitamin D Insufficiency  - VItamin D level 24. Start Vitamin D 1000 U daily. Questionable UTI - Currently on IV ceftriaxone per primary team. Cultures in progress.  Non-insulin Dependent Type II DM - Hold home metformin and glipizide. Management per primary team.  Juluis Mire, MD  06/01/2014  10:41 AM   I have examined the patient and reviewed  assessment and plan and discussed with patient.  Agree with above as stated.  Some evidence fo fluid overload.  WIll decrease IV fluids right now just before the surgery.  Katyana Trolinger S.

## 2014-06-01 NOTE — Progress Notes (Signed)
SUBJECTIVE:  Reports some hip pain.  No chest pain reported since the fall..  OBJECTIVE:   Vitals:   Filed Vitals:   06/01/14 0330 06/01/14 0400 06/01/14 0500 06/01/14 0809  BP: 147/47   149/67  Pulse: 83 42 84 79  Temp: 98.5 F (36.9 C)   98 F (36.7 C)  TempSrc: Oral   Oral  Resp: 28 31 23 22   Height:      Weight:      SpO2: 94% 95% 92% 94%   I&O's:   Intake/Output Summary (Last 24 hours) at 06/01/14 1040 Last data filed at 06/01/14 1000  Gross per 24 hour  Intake 1876.83 ml  Output    985 ml  Net 891.83 ml   TELEMETRY: Reviewed telemetry pt in NSR:     PHYSICAL EXAM General: Well developed, well nourished, in no acute distress Head:   Normal cephalic and atramatic  Lungs:  No wheezing. Heart:  HRRR S1 S2  No JVD.   Abdomen: abdomen soft and non-tender Msk:  Generally weak Extremities: No edema.   Neuro: Seeing people who are not in the room Psych:  Normal affect, responds appropriately Skin: No rash   LABS: Basic Metabolic Panel:  Recent Labs  05/31/14 2013 06/01/14 0259  NA 139 139  K 5.1 4.9  CL 103 103  CO2 24 22  GLUCOSE 186* 196*  BUN 25* 26*  CREATININE 1.30 1.19  CALCIUM 8.7 8.8   Liver Function Tests:  Recent Labs  05/31/14 0315 06/01/14 0259  AST 17 24  ALT 12 12  ALKPHOS 94 84  BILITOT 0.4 0.5  PROT 6.9 6.7  ALBUMIN 3.3* 3.1*   No results for input(s): LIPASE, AMYLASE in the last 72 hours. CBC:  Recent Labs  05/30/14 1701 05/31/14 0315 05/31/14 2013 06/01/14 0259  WBC 9.6 9.9 9.0 8.8  NEUTROABS 7.9* 8.5*  --   --   HGB 12.4* 12.1* 11.6* 11.6*  HCT 37.9* 37.4* 35.5* 35.8*  MCV 89.4 89.5 90.1 89.5  PLT 156 151 125* 141*   Cardiac Enzymes:  Recent Labs  05/31/14 0615 05/31/14 1235 05/31/14 1649  TROPONINI 0.75* 0.51* 0.52*   BNP: Invalid input(s): POCBNP D-Dimer: No results for input(s): DDIMER in the last 72 hours. Hemoglobin A1C: No results for input(s): HGBA1C in the last 72 hours. Fasting Lipid  Panel: No results for input(s): CHOL, HDL, LDLCALC, TRIG, CHOLHDL, LDLDIRECT in the last 72 hours. Thyroid Function Tests: No results for input(s): TSH, T4TOTAL, T3FREE, THYROIDAB in the last 72 hours.  Invalid input(s): FREET3 Anemia Panel: No results for input(s): VITAMINB12, FOLATE, FERRITIN, TIBC, IRON, RETICCTPCT in the last 72 hours. Coag Panel:   Lab Results  Component Value Date   INR 1.06 05/31/2014   INR 1.06 10/01/2013    RADIOLOGY: Dg Thoracic Spine 2 View  05/30/2014   CLINICAL DATA:  Fall, mid back pain  EXAM: THORACIC SPINE - 2 VIEW  COMPARISON:  None.  FINDINGS: Normal thoracic kyphosis.  No evidence of fracture dislocation. Vertebral body heights are maintained.  Moderate multilevel degenerative changes.  Vascular calcifications.  Visualized lungs are clear.  IMPRESSION: No fracture or dislocation is seen.  Moderate multilevel degenerative changes.   Electronically Signed   By: Julian Hy M.D.   On: 05/30/2014 19:23   Dg Lumbar Spine Complete  05/30/2014   CLINICAL DATA:  Fall, mid/lower back pain  EXAM: LUMBAR SPINE - COMPLETE 4+ VIEW  COMPARISON:  CT abdomen dated 08/27/2013  FINDINGS:  Five lumbar type vertebral bodies.  Normal lumbar lordosis.  No evidence of fracture or dislocation. Vertebral body heights are maintained.  Grade 1 anterolisthesis of L4 on L5.  Moderate multilevel degenerative changes.  Visualized bony pelvis appears intact.  Vascular calcifications with known infrarenal abdominal aortic aneurysm.a  IMPRESSION: No fracture or dislocation is seen.  Moderate degenerative changes.  Grade 1 anterolisthesis of L4 on L5.   Electronically Signed   By: Julian Hy M.D.   On: 05/30/2014 20:01   Dg Shoulder Right  05/30/2014   CLINICAL DATA:  Fall, right shoulder pain  EXAM: RIGHT SHOULDER - 2+ VIEW  COMPARISON:  None.  FINDINGS: No fracture or dislocation is seen.  Moderate degenerative changes of the glenohumeral joint and acromioclavicular joint  The  visualized soft tissues are unremarkable.  Visualized right lung is clear.  IMPRESSION: No fracture or dislocation is seen.  Moderate degenerative changes.   Electronically Signed   By: Julian Hy M.D.   On: 05/30/2014 19:22   Dg Hip Complete Left  05/30/2014   CLINICAL DATA:  Left hip pain after falling at home today at 1500 hr.  EXAM: LEFT HIP - COMPLETE 2+ VIEW  COMPARISON:  Portable pelvis obtained earlier today.  FINDINGS: Left femoral neck fracture with mild valgus angulation. Atheromatous arterial calcifications.  IMPRESSION: Previously noted mildly displaced left femoral neck fracture with mild valgus angulation.   Electronically Signed   By: Enrique Sack M.D.   On: 05/30/2014 19:21   Ct Head Wo Contrast  05/30/2014   CLINICAL DATA:  Right neck pain after falling today and hitting his shoulder against a door jamb.  EXAM: CT HEAD WITHOUT CONTRAST  CT CERVICAL SPINE WITHOUT CONTRAST  TECHNIQUE: Multidetector CT imaging of the head and cervical spine was performed following the standard protocol without intravenous contrast. Multiplanar CT image reconstructions of the cervical spine were also generated.  COMPARISON:  10/01/2013.  FINDINGS: CT HEAD FINDINGS  Diffusely enlarged ventricles and subarachnoid spaces. Patchy white matter low density in both cerebral hemispheres. No skull fracture, intracranial hemorrhage or paranasal sinus air-fluid levels. Dense arterial calcifications at the skull base.  CT CERVICAL SPINE FINDINGS  Extensive multilevel cervical spine degenerative changes. No prevertebral soft tissue swelling, fractures or subluxations. Bilateral vertebral and carotid artery atheromatous calcifications. Bullous changes at both lung apices.  IMPRESSION: 1. No skull fracture or intracranial hemorrhage. 2. No cervical spine fracture or subluxation. 3. Stable mid atrophy and chronic small vessel white matter ischemic changes. 4. Stable marked cervical spine degenerative changes. 5. COPD.    Electronically Signed   By: Enrique Sack M.D.   On: 05/30/2014 19:07   Ct Chest W Contrast  05/30/2014   CLINICAL DATA:  [place] today. Complaining of right shoulder and left hip pain.  EXAM: CT CHEST, ABDOMEN, AND PELVIS WITH CONTRAST  TECHNIQUE: Multidetector CT imaging of the chest, abdomen and pelvis was performed following the standard protocol during bolus administration of intravenous contrast.  CONTRAST:  148mL OMNIPAQUE IOHEXOL 300 MG/ML  SOLN  COMPARISON:  Chest CT, 08/30/2013.  FINDINGS: CT CHEST FINDINGS  Heart is mildly enlarged. There are dense coronary artery and mitral valve annular calcifications. Aorta is normal in caliber. There is partly calcified mild plaque along the thoracic aorta and at the origin of the left subclavian artery. No significant stenosis.  Right left pulmonary arteries are dilated to 3 cm, stable.  Borderline enlarged right peritracheal lymph node measures 1 cm short axis, stable. No mediastinal masses.  No hilar masses or adenopathy.  Lungs show heterogeneous coarse peripheral reticular opacities consistent with interstitial fibrosis, with a pattern suggesting UIP. Focal opacity noted in the left lower lobe on the prior exam measuring 15 mm x 7 mm, is without significant change. No acute lung consolidation or evidence of edema. No pleural effusion or pneumothorax.  CT ABDOMEN AND PELVIS FINDINGS  Liver shows morphologic changes consistent with cirrhosis. No liver mass or focal lesion. No contusion or laceration.  Mean is prominent measuring 12.4 cm in greatest dimension. No splenic mass or focal lesion.  Gallbladder is unremarkable. No bile duct dilation. No pancreatic masses or inflammation. No adrenal masses.  Mild bilateral renal cortical thinning. There is a peripherally calcified hypo attenuating, 3.1 cm mass protruding from the inferior margin of the left kidney, not imaged on the prior chest CT. This is likely a mildly complicated cyst. There is a low attenuation lesion in  the upper pole left kidney, most likely a simple cyst. No other renal masses or lesions. No hydronephrosis. Normal ureters. Bladder is unremarkable.  There is an infrarenal abdominal aortic aneurysm measuring 3.7 cm in greatest anterior-posterior dimension. No evidence of rupture. Atherosclerotic changes noted along the abdominal aorta and its branch vessels.  No pathologically enlarged lymph nodes. No abnormal fluid collections. Specifically no evidence of hemo peritoneum.  No bowel wall thickening to suggest a bowel hematoma. No bowel inflammatory changes. No mesenteric inflammation or contusion/hematoma.  MUSCULOSKELETAL:  There fractures of the right acetabulum. Fracture extends across the posterior column, extending across the medial acetabulum to the anterior,. Fracture is mildly distracted by 7 mm. There is mild fracture comminution with small fracture fragments noted between the major fracture components, at least 1 within the hip joint.  There is a fracture of the left femoral neck. This is subcapital, oblique and non comminuted. Fracture is displaced with the distal fracture component migrating superiorly by 17 mm. There is also mild varus angulation as well as significant apex anterior angulation.  No other pelvic or proximal femur fractures.  There are no other fractures.  IMPRESSION: 1. Fractures of the right acetabulum involving the anterior posterior columns. 2. Fracture of the left femoral neck, mildly displaced and angulated. 3. No other fractures. 4. No other acute findings in the chest, abdomen or pelvis. No other evidence of acute injury. 5. Chronic findings include cardiomegaly, coronary artery calcifications and changes consistent with interstitial fibrosis. Pulmonary artery dilation suggest pulmonary artery hypertension. Below the diaphragm, there changes of cirrhosis with borderline splenomegaly. Probable left renal cysts. 6. 3.7 cm infrarenal abdominal aortic aneurysm. No evidence of  rupture.   Electronically Signed   By: Lajean Manes M.D.   On: 05/30/2014 22:06   Ct Cervical Spine Wo Contrast  05/30/2014   CLINICAL DATA:  Right neck pain after falling today and hitting his shoulder against a door jamb.  EXAM: CT HEAD WITHOUT CONTRAST  CT CERVICAL SPINE WITHOUT CONTRAST  TECHNIQUE: Multidetector CT imaging of the head and cervical spine was performed following the standard protocol without intravenous contrast. Multiplanar CT image reconstructions of the cervical spine were also generated.  COMPARISON:  10/01/2013.  FINDINGS: CT HEAD FINDINGS  Diffusely enlarged ventricles and subarachnoid spaces. Patchy white matter low density in both cerebral hemispheres. No skull fracture, intracranial hemorrhage or paranasal sinus air-fluid levels. Dense arterial calcifications at the skull base.  CT CERVICAL SPINE FINDINGS  Extensive multilevel cervical spine degenerative changes. No prevertebral soft tissue swelling, fractures or subluxations. Bilateral vertebral  and carotid artery atheromatous calcifications. Bullous changes at both lung apices.  IMPRESSION: 1. No skull fracture or intracranial hemorrhage. 2. No cervical spine fracture or subluxation. 3. Stable mid atrophy and chronic small vessel white matter ischemic changes. 4. Stable marked cervical spine degenerative changes. 5. COPD.   Electronically Signed   By: Enrique Sack M.D.   On: 05/30/2014 19:07   Ct Abdomen Pelvis W Contrast  05/30/2014   CLINICAL DATA:  [place] today. Complaining of right shoulder and left hip pain.  EXAM: CT CHEST, ABDOMEN, AND PELVIS WITH CONTRAST  TECHNIQUE: Multidetector CT imaging of the chest, abdomen and pelvis was performed following the standard protocol during bolus administration of intravenous contrast.  CONTRAST:  157mL OMNIPAQUE IOHEXOL 300 MG/ML  SOLN  COMPARISON:  Chest CT, 08/30/2013.  FINDINGS: CT CHEST FINDINGS  Heart is mildly enlarged. There are dense coronary artery and mitral valve annular  calcifications. Aorta is normal in caliber. There is partly calcified mild plaque along the thoracic aorta and at the origin of the left subclavian artery. No significant stenosis.  Right left pulmonary arteries are dilated to 3 cm, stable.  Borderline enlarged right peritracheal lymph node measures 1 cm short axis, stable. No mediastinal masses. No hilar masses or adenopathy.  Lungs show heterogeneous coarse peripheral reticular opacities consistent with interstitial fibrosis, with a pattern suggesting UIP. Focal opacity noted in the left lower lobe on the prior exam measuring 15 mm x 7 mm, is without significant change. No acute lung consolidation or evidence of edema. No pleural effusion or pneumothorax.  CT ABDOMEN AND PELVIS FINDINGS  Liver shows morphologic changes consistent with cirrhosis. No liver mass or focal lesion. No contusion or laceration.  Mean is prominent measuring 12.4 cm in greatest dimension. No splenic mass or focal lesion.  Gallbladder is unremarkable. No bile duct dilation. No pancreatic masses or inflammation. No adrenal masses.  Mild bilateral renal cortical thinning. There is a peripherally calcified hypo attenuating, 3.1 cm mass protruding from the inferior margin of the left kidney, not imaged on the prior chest CT. This is likely a mildly complicated cyst. There is a low attenuation lesion in the upper pole left kidney, most likely a simple cyst. No other renal masses or lesions. No hydronephrosis. Normal ureters. Bladder is unremarkable.  There is an infrarenal abdominal aortic aneurysm measuring 3.7 cm in greatest anterior-posterior dimension. No evidence of rupture. Atherosclerotic changes noted along the abdominal aorta and its branch vessels.  No pathologically enlarged lymph nodes. No abnormal fluid collections. Specifically no evidence of hemo peritoneum.  No bowel wall thickening to suggest a bowel hematoma. No bowel inflammatory changes. No mesenteric inflammation or  contusion/hematoma.  MUSCULOSKELETAL:  There fractures of the right acetabulum. Fracture extends across the posterior column, extending across the medial acetabulum to the anterior,. Fracture is mildly distracted by 7 mm. There is mild fracture comminution with small fracture fragments noted between the major fracture components, at least 1 within the hip joint.  There is a fracture of the left femoral neck. This is subcapital, oblique and non comminuted. Fracture is displaced with the distal fracture component migrating superiorly by 17 mm. There is also mild varus angulation as well as significant apex anterior angulation.  No other pelvic or proximal femur fractures.  There are no other fractures.  IMPRESSION: 1. Fractures of the right acetabulum involving the anterior posterior columns. 2. Fracture of the left femoral neck, mildly displaced and angulated. 3. No other fractures. 4. No other acute  findings in the chest, abdomen or pelvis. No other evidence of acute injury. 5. Chronic findings include cardiomegaly, coronary artery calcifications and changes consistent with interstitial fibrosis. Pulmonary artery dilation suggest pulmonary artery hypertension. Below the diaphragm, there changes of cirrhosis with borderline splenomegaly. Probable left renal cysts. 6. 3.7 cm infrarenal abdominal aortic aneurysm. No evidence of rupture.   Electronically Signed   By: Lajean Manes M.D.   On: 05/30/2014 22:06   Dg Pelvis Portable  05/30/2014   CLINICAL DATA:  [place] today at 3 o'clock. Tripped over a door way. Left hip pain.  EXAM: PORTABLE PELVIS 1-2 VIEWS PORTABLE ONE-VIEW CHEST X-RAY.  COMPARISON:  HIP RADIOGRAPHS 10/01/2013 AND CHEST X-RAY 08/26/2013.  FINDINGS: CHEST X-RAY:  The heart is mildly enlarged but stable. There is tortuosity and calcification of the thoracic aorta. Chronic lung changes with pulmonary fibrosis. No definite acute overlying pulmonary process. The bony thorax is grossly intact.  Pelvis:   There is a displaced left femoral neck fracture. The right hip is intact. Moderate degenerative changes bilaterally, right greater than left. The pubic symphysis and SI joints are intact. No pelvic fractures. Extensive vascular calcifications are noted.  IMPRESSION: 1. Displaced left femoral neck fracture. 2. No acute cardiopulmonary findings. Chronic lung changes/pulmonary fibrosis.   Electronically Signed   By: Kalman Jewels M.D.   On: 05/30/2014 17:36   Dg Pelvis Comp Min 3v  05/31/2014   CLINICAL DATA:  Right acetabular fracture  EXAM: JUDET PELVIS - 3+ VIEW  COMPARISON:  05/30/2014  FINDINGS: Again noted changes consistent with a left femoral neck fracture with impaction. The right acetabular fracture is again noted and unchanged. No right femoral abnormality seen.  IMPRESSION: Right acetabular fracture stable from the prior exam.  Left femoral neck fracture stable from the prior exam.   Electronically Signed   By: Inez Catalina M.D.   On: 05/31/2014 11:34   Ct 3d Recon At Scanner  05/31/2014   CLINICAL DATA:  Nonspecific (abnormal) findings on radiological and other examination of musculoskeletal system. Right acetabular fracture. Left femoral neck fracture.  EXAM: 3-DIMENSIONAL CT IMAGE RENDERING ON ACQUISITION WORKSTATION  TECHNIQUE: 3-dimensional CT images were rendered by post-processing of the original CT data on an acquisition workstation.  COMPARISON:  CT scan and radiographs dated 05/30/2014  FINDINGS: There is a oblique vertical fracture through the medial aspect of the right acetabulum the with approximately 8 mm of distraction. There is also a subtle old healed fracture of the right inferior pubic ramus. I do not think these are acute.  There is an acute subcapital fracture of the left femoral neck with impaction and angulation. There are slight arthritic changes of the superior lateral aspect of the left acetabulum.  The 3D images better demonstrate the right acetabular fracture.   IMPRESSION: Slightly distracted subacute fracture through the medial aspect of the right acetabulum. Old healed fracture of the low right inferior pubic ramus.   Electronically Signed   By: Rozetta Nunnery M.D.   On: 05/31/2014 11:37   Dg Chest Port 1 View  05/31/2014   CLINICAL DATA:  79 year old male with low oxygen level. Subsequent encounter.  EXAM: PORTABLE CHEST - 1 VIEW  COMPARISON:  05/30/2014 and 08/26/2013.  FINDINGS: Pulmonary vascular congestion superimposed upon chronic lung changes.  Elevated left hemidiaphragm with left base subsegmental atelectasis.  No gross pneumothorax.  Cardiomegaly.  Calcified mildly tortuous aorta.  IMPRESSION: Pulmonary vascular congestion superimposed upon chronic lung changes.  Elevated left hemidiaphragm with left base  subsegmental atelectasis.  No gross pneumothorax.  Cardiomegaly.  Calcified mildly tortuous aorta.   Electronically Signed   By: Chauncey Cruel M.D.   On: 05/31/2014 06:55   Dg Chest Port 1 View  05/30/2014   CLINICAL DATA:  80 year old male who fell at 1500 hr today tripping over door way. Acute pain. Initial encounter.  EXAM: PORTABLE CHEST - 1 VIEW  COMPARISON:  Chest CT 08/30/2013 and earlier.  FINDINGS: Portable AP semi upright view at 1716 hr. Lower lung volumes. Stable cardiomegaly and mediastinal contours. Peripheral increased opacity greater in the left lung is stable. No pneumothorax, pulmonary edema, or pleural effusion identified. No acute pulmonary opacity identified. Calcified atherosclerosis of the aorta. Visualized tracheal air column is within normal limits. No acute osseous abnormality identified.  IMPRESSION: Cardiomegaly and chronic lung disease. No superimposed acute findings are identified.   Electronically Signed   By: Lars Pinks M.D.   On: 05/30/2014 17:35      ASSESSMENT: AFib; Increased troponin  PLAN:  NSR currently- on Sotalol  Increased troponin: Normal LV fxn with no RWMA.  Likely demand ischemia.  Surgery planned for  later today.  Jettie Booze., MD  06/01/2014  10:40 AM

## 2014-06-01 NOTE — Transfer of Care (Signed)
Immediate Anesthesia Transfer of Care Note  Patient: [name] [name]  Procedure(s) Performed: Procedure(s): ARTHROPLASTY BIPOLAR HIP (Left)  Patient Location: PACU  Anesthesia Type:MAC and Spinal  Level of Consciousness: awake, alert  and patient cooperative  Airway & Oxygen Therapy: Patient Spontanous Breathing and Patient connected to face mask oxygen  Post-op Assessment: Report given to PACU RN and Post -op Vital signs reviewed and stable  Post vital signs: Reviewed and stable  Complications: No apparent anesthesia complications

## 2014-06-01 NOTE — Progress Notes (Addendum)
Shift event: Earlier in shift, RN paged this NP secondary to pt acting delirious but not hallucinating. Just seems more confused at times. BP has been low but is improved now. RN increased O2 to 6L because of low normal O2 sats. ABG ordered.   NP to bedside.  Vitals reviewed and are stable. Pt is awake and knows his name and that he is in the hospital. RN says he is better now than earlier in shift. BMP looks fine. ABG OK. Ammonia pending. UA showed borderline positive so Rocephin started with culture pending. CBC normal.  Update: Pt alert, still confused, but no delirium or hallucinations. BP conts to be better. Other VSS. UO has picked up to 40-50cc/hr.  Clance Boll, NP [facility]

## 2014-06-01 NOTE — Anesthesia Preprocedure Evaluation (Addendum)
Anesthesia Evaluation  Patient identified by MRN, date of birth, ID band Patient awake    Reviewed: Allergy & Precautions, H&P , NPO status   Airway        Dental   Pulmonary COPD COPD inhaler and oxygen dependent, Current Smoker,          Cardiovascular  ECHO 05/31/14 EF 62%, mod diastolic dysfunction, EKG bifasicular block   Neuro/Psych    GI/Hepatic   Endo/Other  diabetes, Poorly Controlled, Type 2  Renal/GU      Musculoskeletal   Abdominal   Peds  Hematology   Anesthesia Other Findings   Reproductive/Obstetrics                            Anesthesia Physical Anesthesia Plan  ASA: IV  Anesthesia Plan: General and Spinal   Post-op Pain Management:    Induction: Intravenous  Airway Management Planned: Oral ETT  Additional Equipment:   Intra-op Plan:   Post-operative Plan: Extubation in OR  Informed Consent: I have reviewed the patients History and Physical, chart, labs and discussed the procedure including the risks, benefits and alternatives for the proposed anesthesia with the patient or authorized representative who has indicated his/her understanding and acceptance.     Plan Discussed with:   Anesthesia Plan Comments: (ASA IVE, CHF, COPD, AF, CAD, DM, ECHO 55%, Moderate diastolic failure.  Pt reasonably independent till fall.  Have talked with family at length.  Will do hypogaric spinal with .2mg  o f Duramorph for post-op pain control.  If spinal does not work GA will be done..  Have told them post op ventilation likely if GA done.   /Family understands risks and wants to proceed.  )       Anesthesia Quick Evaluation

## 2014-06-02 ENCOUNTER — Encounter (HOSPITAL_COMMUNITY): Payer: Self-pay | Admitting: Orthopedic Surgery

## 2014-06-02 DIAGNOSIS — J8410 Pulmonary fibrosis, unspecified: Secondary | ICD-10-CM

## 2014-06-02 LAB — BASIC METABOLIC PANEL
ANION GAP: 14 (ref 5–15)
BUN: 30 mg/dL — ABNORMAL HIGH (ref 6–23)
CALCIUM: 8.9 mg/dL (ref 8.4–10.5)
CO2: 21 mEq/L (ref 19–32)
CREATININE: 1.1 mg/dL (ref 0.50–1.35)
Chloride: 106 mEq/L (ref 96–112)
GFR calc Af Amer: 69 mL/min — ABNORMAL LOW (ref >90)
GFR calc non Af Amer: 59 mL/min — ABNORMAL LOW (ref >90)
Glucose, Bld: 181 mg/dL — ABNORMAL HIGH (ref 70–99)
Potassium: 4.9 mEq/L (ref 3.7–5.3)
Sodium: 141 mEq/L (ref 137–147)

## 2014-06-02 LAB — CBC
HCT: 31.5 % — ABNORMAL LOW (ref 39.0–52.0)
Hemoglobin: 10.1 g/dL — ABNORMAL LOW (ref 13.0–17.0)
MCH: 28.6 pg (ref 26.0–34.0)
MCHC: 32.1 g/dL (ref 30.0–36.0)
MCV: 89.2 fL (ref 78.0–100.0)
PLATELETS: 140 10*3/uL — AB (ref 150–400)
RBC: 3.53 MIL/uL — AB (ref 4.22–5.81)
RDW: 14.5 % (ref 11.5–15.5)
WBC: 7.7 10*3/uL (ref 4.0–10.5)

## 2014-06-02 LAB — GLUCOSE, CAPILLARY
GLUCOSE-CAPILLARY: 185 mg/dL — AB (ref 70–99)
GLUCOSE-CAPILLARY: 198 mg/dL — AB (ref 70–99)
Glucose-Capillary: 167 mg/dL — ABNORMAL HIGH (ref 70–99)
Glucose-Capillary: 178 mg/dL — ABNORMAL HIGH (ref 70–99)
Glucose-Capillary: 176 mg/dL — ABNORMAL HIGH (ref 70–99)

## 2014-06-02 LAB — URINE CULTURE
Colony Count: NO GROWTH
Culture: NO GROWTH

## 2014-06-02 MED ORDER — MORPHINE SULFATE 2 MG/ML IJ SOLN
2.0000 mg | Freq: Once | INTRAMUSCULAR | Status: AC
  Administered 2014-06-02: 2 mg via INTRAVENOUS
  Filled 2014-06-02: qty 1

## 2014-06-02 MED ORDER — CALCIUM CARBONATE 1250 (500 CA) MG PO TABS
2.0000 | ORAL_TABLET | Freq: Every day | ORAL | Status: DC
  Administered 2014-06-02 – 2014-06-06 (×5): 1000 mg via ORAL
  Filled 2014-06-02 (×6): qty 2

## 2014-06-02 NOTE — Progress Notes (Signed)
[facility] PROGRESS NOTE  [name] [name] QMV:[id number] DOB: 1927-04-04 DOA: 05/30/2014 PCP: Mathews Argyle, MD  Assessment/Plan:   Left hip fracture status post mechanical fall       -has high cardiac and Pulm risk       -appreciate Pulm and Cards input       -Ortho following, tolerated surgery better than expected       -DVT proph: lovenox   Abnormal troponin-trending down likely secondary to demand ischemia, cardiology consulted, 2-D echo completed, no wall motion abnormality noted, continue ASA/statin, remains on sotalol        Severe COPD/Chronic hypoxemia with interstitial lung disease        -Pulm consult appreciated, add nebs PRN   Diabetes mellitus type 2        -stable, continue SSI        UTI       -DC continue ceftriaxone, Urine Cx negative   History of atrial fibrillation status post ablation presently in sinus rhythm - continue sotalol.   Hypertension - continue present medications.   Hyperlipidemia - continue present medications.   Mild anemia - follow CBC and if there is no significant fall in hemoglobin further workup as outpatient       Confusion/Encephalopathy      -due to meds, UTI, COPD etc      -caution with narcotics, monitor        DVT proph: lovenox  Code Status: full Family Communication: no family at bedside Disposition Plan:  As above    Brief narrative: [name] [name] is a 80 y.o. male with history of atrial fibrillation status post ablation, diabetes mellitus, hypertension and hyperlipidemia had a fall at his house when he was trying to cross the door. Patient did not hit his head or lose consciousness and patient was brought to the ER. In the ER workup revealed left hip fracture and also right acetabular fracture. Dr. Mardelle Matte was consulted from orthopedics. Patient was found to be hypoxic requiring 100% nonrebreather. Patient otherwise denies any chest pain or shortness of breath or any productive cough fever chills nausea  vomiting abdominal pain. Patient has known history of interstitial lung disease and since patient was short of breath CT chest was done which was not showing anything acute except for the known interstitial lung disease. Patient in addition also has right shoulder pain since the fall.  Consultants:  Cardiology  Pulmonary  Orthopedics  Procedures:  none  Antibiotics:  none  HPI/Subjective: More lucid  Objective: Filed Vitals:   06/02/14 0900 06/02/14 1000 06/02/14 1145 06/02/14 1200  BP: 149/115 131/41 131/56   Pulse: 86 84 74 72  Temp:   97.2 F (36.2 C)   TempSrc:   Oral   Resp: 25 28 17 21   Height:      Weight:      SpO2: 94% 99% 98% 92%    Intake/Output Summary (Last 24 hours) at 06/02/14 1500 Last data filed at 06/02/14 1100  Gross per 24 hour  Intake  420.5 ml  Output    825 ml  Net -404.5 ml    Exam:   General: AAO to self and place  Eyes: anicteric no pallor.  ENT: no discharge from ears eyes nose and mouth.  Neck: no mass felt.  Cardiovascular: S1-S2/RRR  Respiratory: no rhonchi or crepitations.  Abdomen: soft nontender bowel sounds present no guarding or rigidity.  Skin: no rash.  Musculoskeletal: pain on moving lower extremities. Patient also has pain in  my right shoulder.  Psychiatric: appears normal.        Data Reviewed: Basic Metabolic Panel:  Recent Labs Lab 05/30/14 1701 05/31/14 0315 05/31/14 2013 06/01/14 0259 06/02/14 0319  NA 139 137 139 139 141  K 4.6 5.1 5.1 4.9 4.9  CL 101 100 103 103 106  CO2 24 27 24 22 21   GLUCOSE 227* 221* 186* 196* 181*  BUN 18 19 25* 26* 30*  CREATININE 0.94 1.08 1.30 1.19 1.10  CALCIUM 8.8 8.8 8.7 8.8 8.9    Liver Function Tests:  Recent Labs Lab 05/31/14 0315 06/01/14 0259  AST 17 24  ALT 12 12  ALKPHOS 94 84  BILITOT 0.4 0.5  PROT 6.9 6.7  ALBUMIN 3.3* 3.1*   No results for input(s): LIPASE, AMYLASE in the last 168 hours.  Recent Labs Lab 06/01/14 0304   AMMONIA 22    CBC:  Recent Labs Lab 05/30/14 1701 05/31/14 0315 05/31/14 2013 06/01/14 0259 06/02/14 0319  WBC 9.6 9.9 9.0 8.8 7.7  NEUTROABS 7.9* 8.5*  --   --   --   HGB 12.4* 12.1* 11.6* 11.6* 10.1*  HCT 37.9* 37.4* 35.5* 35.8* 31.5*  MCV 89.4 89.5 90.1 89.5 89.2  PLT 156 151 125* 141* 140*    Cardiac Enzymes:  Recent Labs Lab 05/30/14 1701 05/31/14 0315 05/31/14 0615 05/31/14 1235 05/31/14 1649  TROPONINI <0.30 0.87* 0.75* 0.51* 0.52*   BNP (last 3 results)  Recent Labs  05/31/14 0315  PROBNP 7931.0*     CBG:  Recent Labs Lab 06/01/14 1517 06/01/14 1718 06/01/14 2025 06/02/14 0759 06/02/14 1147  GLUCAP 149* 157* 152* 185* 198*    Recent Results (from the past 240 hour(s))  Surgical pcr screen     Status: Abnormal   Collection Time: 05/31/14  1:33 AM  Result Value Ref Range Status   MRSA, PCR NEGATIVE NEGATIVE Final   Staphylococcus aureus POSITIVE (A) NEGATIVE Final    Comment:        The Xpert SA Assay (FDA approved for NASAL specimens in patients over 42 years of age), is one component of a comprehensive surveillance program.  Test performance has been validated by [employer] for patients greater than or equal to 3 year old. It is not intended to diagnose infection nor to guide or monitor treatment. RESULT CALLED TO, READ BACK BY AND VERIFIED WITH: A WEATHERFORD,RN 05/31/14 0400 BY RHOLMES   Urine culture     Status: None   Collection Time: 05/31/14  2:06 AM  Result Value Ref Range Status   Specimen Description URINE, CATHETERIZED  Final   Special Requests NONE  Final   Culture  Setup Time   Final    05/31/2014 10:06 Performed at [place] Performed at [facility]   Final   Culture NO GROWTH Performed at [facility]   Final   Report Status 06/01/2014 FINAL  Final  Culture, Urine     Status: None   Collection Time: 05/31/14  9:21 PM  Result Value Ref Range Status    Specimen Description URINE, RANDOM  Final   Special Requests ADDED [id number]  Final   Culture  Setup Time   Final    06/01/2014 10:17 Performed at [place] Performed at [facility]   Final   Culture NO GROWTH Performed at [facility]   Final   Report Status  06/02/2014 FINAL  Final     Studies: Dg Thoracic Spine 2 View  05/30/2014   CLINICAL DATA:  Fall, mid back pain  EXAM: THORACIC SPINE - 2 VIEW  COMPARISON:  None.  FINDINGS: Normal thoracic kyphosis.  No evidence of fracture dislocation. Vertebral body heights are maintained.  Moderate multilevel degenerative changes.  Vascular calcifications.  Visualized lungs are clear.  IMPRESSION: No fracture or dislocation is seen.  Moderate multilevel degenerative changes.   Electronically Signed   By: Julian Hy M.D.   On: 05/30/2014 19:23   Dg Lumbar Spine Complete  05/30/2014   CLINICAL DATA:  Fall, mid/lower back pain  EXAM: LUMBAR SPINE - COMPLETE 4+ VIEW  COMPARISON:  CT abdomen dated 08/27/2013  FINDINGS: Five lumbar type vertebral bodies.  Normal lumbar lordosis.  No evidence of fracture or dislocation. Vertebral body heights are maintained.  Grade 1 anterolisthesis of L4 on L5.  Moderate multilevel degenerative changes.  Visualized bony pelvis appears intact.  Vascular calcifications with known infrarenal abdominal aortic aneurysm.a  IMPRESSION: No fracture or dislocation is seen.  Moderate degenerative changes.  Grade 1 anterolisthesis of L4 on L5.   Electronically Signed   By: Julian Hy M.D.   On: 05/30/2014 20:01   Dg Shoulder Right  05/30/2014   CLINICAL DATA:  Fall, right shoulder pain  EXAM: RIGHT SHOULDER - 2+ VIEW  COMPARISON:  None.  FINDINGS: No fracture or dislocation is seen.  Moderate degenerative changes of the glenohumeral joint and acromioclavicular joint  The visualized soft tissues are unremarkable.  Visualized right lung is clear.  IMPRESSION: No  fracture or dislocation is seen.  Moderate degenerative changes.   Electronically Signed   By: Julian Hy M.D.   On: 05/30/2014 19:22   Dg Hip Complete Left  05/30/2014   CLINICAL DATA:  Left hip pain after falling at home today at 1500 hr.  EXAM: LEFT HIP - COMPLETE 2+ VIEW  COMPARISON:  Portable pelvis obtained earlier today.  FINDINGS: Left femoral neck fracture with mild valgus angulation. Atheromatous arterial calcifications.  IMPRESSION: Previously noted mildly displaced left femoral neck fracture with mild valgus angulation.   Electronically Signed   By: Enrique Sack M.D.   On: 05/30/2014 19:21   Ct Head Wo Contrast  05/30/2014   CLINICAL DATA:  Right neck pain after falling today and hitting his shoulder against a door jamb.  EXAM: CT HEAD WITHOUT CONTRAST  CT CERVICAL SPINE WITHOUT CONTRAST  TECHNIQUE: Multidetector CT imaging of the head and cervical spine was performed following the standard protocol without intravenous contrast. Multiplanar CT image reconstructions of the cervical spine were also generated.  COMPARISON:  10/01/2013.  FINDINGS: CT HEAD FINDINGS  Diffusely enlarged ventricles and subarachnoid spaces. Patchy white matter low density in both cerebral hemispheres. No skull fracture, intracranial hemorrhage or paranasal sinus air-fluid levels. Dense arterial calcifications at the skull base.  CT CERVICAL SPINE FINDINGS  Extensive multilevel cervical spine degenerative changes. No prevertebral soft tissue swelling, fractures or subluxations. Bilateral vertebral and carotid artery atheromatous calcifications. Bullous changes at both lung apices.  IMPRESSION: 1. No skull fracture or intracranial hemorrhage. 2. No cervical spine fracture or subluxation. 3. Stable mid atrophy and chronic small vessel white matter ischemic changes. 4. Stable marked cervical spine degenerative changes. 5. COPD.   Electronically Signed   By: Enrique Sack M.D.   On: 05/30/2014 19:07   Ct Chest W  Contrast  05/30/2014   CLINICAL DATA:  [place] today. Complaining of  right shoulder and left hip pain.  EXAM: CT CHEST, ABDOMEN, AND PELVIS WITH CONTRAST  TECHNIQUE: Multidetector CT imaging of the chest, abdomen and pelvis was performed following the standard protocol during bolus administration of intravenous contrast.  CONTRAST:  135mL OMNIPAQUE IOHEXOL 300 MG/ML  SOLN  COMPARISON:  Chest CT, 08/30/2013.  FINDINGS: CT CHEST FINDINGS  Heart is mildly enlarged. There are dense coronary artery and mitral valve annular calcifications. Aorta is normal in caliber. There is partly calcified mild plaque along the thoracic aorta and at the origin of the left subclavian artery. No significant stenosis.  Right left pulmonary arteries are dilated to 3 cm, stable.  Borderline enlarged right peritracheal lymph node measures 1 cm short axis, stable. No mediastinal masses. No hilar masses or adenopathy.  Lungs show heterogeneous coarse peripheral reticular opacities consistent with interstitial fibrosis, with a pattern suggesting UIP. Focal opacity noted in the left lower lobe on the prior exam measuring 15 mm x 7 mm, is without significant change. No acute lung consolidation or evidence of edema. No pleural effusion or pneumothorax.  CT ABDOMEN AND PELVIS FINDINGS  Liver shows morphologic changes consistent with cirrhosis. No liver mass or focal lesion. No contusion or laceration.  Mean is prominent measuring 12.4 cm in greatest dimension. No splenic mass or focal lesion.  Gallbladder is unremarkable. No bile duct dilation. No pancreatic masses or inflammation. No adrenal masses.  Mild bilateral renal cortical thinning. There is a peripherally calcified hypo attenuating, 3.1 cm mass protruding from the inferior margin of the left kidney, not imaged on the prior chest CT. This is likely a mildly complicated cyst. There is a low attenuation lesion in the upper pole left kidney, most likely a simple cyst. No other renal masses or  lesions. No hydronephrosis. Normal ureters. Bladder is unremarkable.  There is an infrarenal abdominal aortic aneurysm measuring 3.7 cm in greatest anterior-posterior dimension. No evidence of rupture. Atherosclerotic changes noted along the abdominal aorta and its branch vessels.  No pathologically enlarged lymph nodes. No abnormal fluid collections. Specifically no evidence of hemo peritoneum.  No bowel wall thickening to suggest a bowel hematoma. No bowel inflammatory changes. No mesenteric inflammation or contusion/hematoma.  MUSCULOSKELETAL:  There fractures of the right acetabulum. Fracture extends across the posterior column, extending across the medial acetabulum to the anterior,. Fracture is mildly distracted by 7 mm. There is mild fracture comminution with small fracture fragments noted between the major fracture components, at least 1 within the hip joint.  There is a fracture of the left femoral neck. This is subcapital, oblique and non comminuted. Fracture is displaced with the distal fracture component migrating superiorly by 17 mm. There is also mild varus angulation as well as significant apex anterior angulation.  No other pelvic or proximal femur fractures.  There are no other fractures.  IMPRESSION: 1. Fractures of the right acetabulum involving the anterior posterior columns. 2. Fracture of the left femoral neck, mildly displaced and angulated. 3. No other fractures. 4. No other acute findings in the chest, abdomen or pelvis. No other evidence of acute injury. 5. Chronic findings include cardiomegaly, coronary artery calcifications and changes consistent with interstitial fibrosis. Pulmonary artery dilation suggest pulmonary artery hypertension. Below the diaphragm, there changes of cirrhosis with borderline splenomegaly. Probable left renal cysts. 6. 3.7 cm infrarenal abdominal aortic aneurysm. No evidence of rupture.   Electronically Signed   By: Lajean Manes M.D.   On: 05/30/2014 22:06   Ct  Cervical Spine Wo Contrast  05/30/2014   CLINICAL DATA:  Right neck pain after falling today and hitting his shoulder against a door jamb.  EXAM: CT HEAD WITHOUT CONTRAST  CT CERVICAL SPINE WITHOUT CONTRAST  TECHNIQUE: Multidetector CT imaging of the head and cervical spine was performed following the standard protocol without intravenous contrast. Multiplanar CT image reconstructions of the cervical spine were also generated.  COMPARISON:  10/01/2013.  FINDINGS: CT HEAD FINDINGS  Diffusely enlarged ventricles and subarachnoid spaces. Patchy white matter low density in both cerebral hemispheres. No skull fracture, intracranial hemorrhage or paranasal sinus air-fluid levels. Dense arterial calcifications at the skull base.  CT CERVICAL SPINE FINDINGS  Extensive multilevel cervical spine degenerative changes. No prevertebral soft tissue swelling, fractures or subluxations. Bilateral vertebral and carotid artery atheromatous calcifications. Bullous changes at both lung apices.  IMPRESSION: 1. No skull fracture or intracranial hemorrhage. 2. No cervical spine fracture or subluxation. 3. Stable mid atrophy and chronic small vessel white matter ischemic changes. 4. Stable marked cervical spine degenerative changes. 5. COPD.   Electronically Signed   By: Enrique Sack M.D.   On: 05/30/2014 19:07   Ct Abdomen Pelvis W Contrast  05/30/2014   CLINICAL DATA:  [place] today. Complaining of right shoulder and left hip pain.  EXAM: CT CHEST, ABDOMEN, AND PELVIS WITH CONTRAST  TECHNIQUE: Multidetector CT imaging of the chest, abdomen and pelvis was performed following the standard protocol during bolus administration of intravenous contrast.  CONTRAST:  161mL OMNIPAQUE IOHEXOL 300 MG/ML  SOLN  COMPARISON:  Chest CT, 08/30/2013.  FINDINGS: CT CHEST FINDINGS  Heart is mildly enlarged. There are dense coronary artery and mitral valve annular calcifications. Aorta is normal in caliber. There is partly calcified mild plaque along the  thoracic aorta and at the origin of the left subclavian artery. No significant stenosis.  Right left pulmonary arteries are dilated to 3 cm, stable.  Borderline enlarged right peritracheal lymph node measures 1 cm short axis, stable. No mediastinal masses. No hilar masses or adenopathy.  Lungs show heterogeneous coarse peripheral reticular opacities consistent with interstitial fibrosis, with a pattern suggesting UIP. Focal opacity noted in the left lower lobe on the prior exam measuring 15 mm x 7 mm, is without significant change. No acute lung consolidation or evidence of edema. No pleural effusion or pneumothorax.  CT ABDOMEN AND PELVIS FINDINGS  Liver shows morphologic changes consistent with cirrhosis. No liver mass or focal lesion. No contusion or laceration.  Mean is prominent measuring 12.4 cm in greatest dimension. No splenic mass or focal lesion.  Gallbladder is unremarkable. No bile duct dilation. No pancreatic masses or inflammation. No adrenal masses.  Mild bilateral renal cortical thinning. There is a peripherally calcified hypo attenuating, 3.1 cm mass protruding from the inferior margin of the left kidney, not imaged on the prior chest CT. This is likely a mildly complicated cyst. There is a low attenuation lesion in the upper pole left kidney, most likely a simple cyst. No other renal masses or lesions. No hydronephrosis. Normal ureters. Bladder is unremarkable.  There is an infrarenal abdominal aortic aneurysm measuring 3.7 cm in greatest anterior-posterior dimension. No evidence of rupture. Atherosclerotic changes noted along the abdominal aorta and its branch vessels.  No pathologically enlarged lymph nodes. No abnormal fluid collections. Specifically no evidence of hemo peritoneum.  No bowel wall thickening to suggest a bowel hematoma. No bowel inflammatory changes. No mesenteric inflammation or contusion/hematoma.  MUSCULOSKELETAL:  There fractures of the right acetabulum. Fracture extends  across the posterior  column, extending across the medial acetabulum to the anterior,. Fracture is mildly distracted by 7 mm. There is mild fracture comminution with small fracture fragments noted between the major fracture components, at least 1 within the hip joint.  There is a fracture of the left femoral neck. This is subcapital, oblique and non comminuted. Fracture is displaced with the distal fracture component migrating superiorly by 17 mm. There is also mild varus angulation as well as significant apex anterior angulation.  No other pelvic or proximal femur fractures.  There are no other fractures.  IMPRESSION: 1. Fractures of the right acetabulum involving the anterior posterior columns. 2. Fracture of the left femoral neck, mildly displaced and angulated. 3. No other fractures. 4. No other acute findings in the chest, abdomen or pelvis. No other evidence of acute injury. 5. Chronic findings include cardiomegaly, coronary artery calcifications and changes consistent with interstitial fibrosis. Pulmonary artery dilation suggest pulmonary artery hypertension. Below the diaphragm, there changes of cirrhosis with borderline splenomegaly. Probable left renal cysts. 6. 3.7 cm infrarenal abdominal aortic aneurysm. No evidence of rupture.   Electronically Signed   By: Lajean Manes M.D.   On: 05/30/2014 22:06   Pelvis Portable  06/01/2014   CLINICAL DATA:  Postop left total hip.  EXAM: PORTABLE PELVIS 1-2 VIEWS  COMPARISON:  05/31/2014  FINDINGS: Patient has undergone total hip arthroplasty. Left hip prosthesis appears well seated. No evidence for dislocation on this frontal view. There is asymmetry of the SI joint, likely related to pelvic fractures. Right acetabular fracture again noted. There is deformity of the right inferior pubic ramus.  IMPRESSION: 1. Status post left total hip arthroplasty. 2. No evidence for dislocation on this frontal view.   Electronically Signed   By: Shon Hale M.D.   On: 06/01/2014  17:07   Dg Pelvis Portable  05/30/2014   CLINICAL DATA:  [place] today at 3 o'clock. Tripped over a door way. Left hip pain.  EXAM: PORTABLE PELVIS 1-2 VIEWS PORTABLE ONE-VIEW CHEST X-RAY.  COMPARISON:  HIP RADIOGRAPHS 10/01/2013 AND CHEST X-RAY 08/26/2013.  FINDINGS: CHEST X-RAY:  The heart is mildly enlarged but stable. There is tortuosity and calcification of the thoracic aorta. Chronic lung changes with pulmonary fibrosis. No definite acute overlying pulmonary process. The bony thorax is grossly intact.  Pelvis:  There is a displaced left femoral neck fracture. The right hip is intact. Moderate degenerative changes bilaterally, right greater than left. The pubic symphysis and SI joints are intact. No pelvic fractures. Extensive vascular calcifications are noted.  IMPRESSION: 1. Displaced left femoral neck fracture. 2. No acute cardiopulmonary findings. Chronic lung changes/pulmonary fibrosis.   Electronically Signed   By: Kalman Jewels M.D.   On: 05/30/2014 17:36   Dg Pelvis Comp Min 3v  05/31/2014   CLINICAL DATA:  Right acetabular fracture  EXAM: JUDET PELVIS - 3+ VIEW  COMPARISON:  05/30/2014  FINDINGS: Again noted changes consistent with a left femoral neck fracture with impaction. The right acetabular fracture is again noted and unchanged. No right femoral abnormality seen.  IMPRESSION: Right acetabular fracture stable from the prior exam.  Left femoral neck fracture stable from the prior exam.   Electronically Signed   By: Inez Catalina M.D.   On: 05/31/2014 11:34   Ct 3d Recon At Scanner  05/31/2014   CLINICAL DATA:  Nonspecific (abnormal) findings on radiological and other examination of musculoskeletal system. Right acetabular fracture. Left femoral neck fracture.  EXAM: 3-DIMENSIONAL CT IMAGE RENDERING ON ACQUISITION WORKSTATION  TECHNIQUE: 3-dimensional CT images were rendered by post-processing of the original CT data on an acquisition workstation.  COMPARISON:  CT scan and radiographs dated  05/30/2014  FINDINGS: There is a oblique vertical fracture through the medial aspect of the right acetabulum the with approximately 8 mm of distraction. There is also a subtle old healed fracture of the right inferior pubic ramus. I do not think these are acute.  There is an acute subcapital fracture of the left femoral neck with impaction and angulation. There are slight arthritic changes of the superior lateral aspect of the left acetabulum.  The 3D images better demonstrate the right acetabular fracture.  IMPRESSION: Slightly distracted subacute fracture through the medial aspect of the right acetabulum. Old healed fracture of the low right inferior pubic ramus.   Electronically Signed   By: Rozetta Nunnery M.D.   On: 05/31/2014 11:37   Dg Chest Port 1 View  05/31/2014   CLINICAL DATA:  80 year old male with low oxygen level. Subsequent encounter.  EXAM: PORTABLE CHEST - 1 VIEW  COMPARISON:  05/30/2014 and 08/26/2013.  FINDINGS: Pulmonary vascular congestion superimposed upon chronic lung changes.  Elevated left hemidiaphragm with left base subsegmental atelectasis.  No gross pneumothorax.  Cardiomegaly.  Calcified mildly tortuous aorta.  IMPRESSION: Pulmonary vascular congestion superimposed upon chronic lung changes.  Elevated left hemidiaphragm with left base subsegmental atelectasis.  No gross pneumothorax.  Cardiomegaly.  Calcified mildly tortuous aorta.   Electronically Signed   By: Chauncey Cruel M.D.   On: 05/31/2014 06:55   Dg Chest Port 1 View  05/30/2014   CLINICAL DATA:  80 year old male who fell at 1500 hr today tripping over door way. Acute pain. Initial encounter.  EXAM: PORTABLE CHEST - 1 VIEW  COMPARISON:  Chest CT 08/30/2013 and earlier.  FINDINGS: Portable AP semi upright view at 1716 hr. Lower lung volumes. Stable cardiomegaly and mediastinal contours. Peripheral increased opacity greater in the left lung is stable. No pneumothorax, pulmonary edema, or pleural effusion identified. No acute  pulmonary opacity identified. Calcified atherosclerosis of the aorta. Visualized tracheal air column is within normal limits. No acute osseous abnormality identified.  IMPRESSION: Cardiomegaly and chronic lung disease. No superimposed acute findings are identified.   Electronically Signed   By: Lars Pinks M.D.   On: 05/30/2014 17:35    Scheduled Meds: . acetaminophen  1,000 mg Oral 4 times per day  . aspirin EC  81 mg Oral Daily  . calcium carbonate  2 tablet Oral Q breakfast  . cefTRIAXone (ROCEPHIN)  IV  1 g Intravenous Q24H  . cholecalciferol  1,000 Units Oral Daily  . docusate sodium  100 mg Oral BID  . enoxaparin (LOVENOX) injection  40 mg Subcutaneous Q24H  . finasteride  5 mg Oral Daily  . insulin aspart  0-9 Units Subcutaneous TID WC  . omega-3 acid ethyl esters  1,000 mg Oral BID  . rosuvastatin  10 mg Oral Daily  . senna  1 tablet Oral BID  . sotalol  80 mg Oral BID   Continuous Infusions:    Principal Problem:   Hip fracture, left Active Problems:   Interstitial lung disease   Hyperlipidemia   History of atrial fibrillation   Diabetes mellitus, controlled   Hip fracture   COPD exacerbation   Elevated troponin   Personal history of other diseases of circulatory system    Time spent: 30 minutes   [facility] Pager [phone]. If 7PM-7AM, please contact night-coverage at [email or web address],  password [email or web address] 06/02/2014, 3:00 PM  LOS: 3 days

## 2014-06-02 NOTE — Progress Notes (Signed)
No new complaints. No post op problems. No respiratory distress  Filed Vitals:   06/02/14 1145 06/02/14 1200 06/02/14 1600 06/02/14 1617  BP: 131/56   125/64  Pulse: 74 72  79  Temp: 97.2 F (36.2 C)   97.8 F (36.6 C)  TempSrc: Oral   Oral  Resp: 17 21 19  32  Height:      Weight:      SpO2: 98% 92% 92% 94%   NAD No JVD Faint B crackles, no wheezes Extrasystoles, no M NABS Ext warm without significant edema   I have reviewed all of today's lab results. 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 Please call if we can be of further assistance  Merton Border, MD ; [facility] [phone].  After 5:30 PM or weekends, call [phone]

## 2014-06-02 NOTE — Progress Notes (Addendum)
06/02/14 1400  PT Visit Information  Last PT Received On 06/02/14  Assistance Needed +3 or more  PT/OT/SLP Co-Evaluation/Treatment Yes  Reason for Co-Treatment For patient/therapist safety  PT goals addressed during session Mobility/safety with mobility  History of Present Illness [name] [name] is a 80 y.o. male with history of atrial fibrillation status post ablation, diabetes mellitus, hypertension and hyperlipidemia had a fall at his house when he was trying to cross the door. Patient did not hit his head or lose consciousness and patient was brought to the ER. In the ER workup revealed left hip fracture and also right acetabular fracture.  Pt underwent  Bipolar arthroplasty of the Lt. Hip  Impression:  Pt with poor endurance which is limiting his progress.  He also appears to need his O2 as he desats on RA.  3LO2 in place with sat 99% on departure.    PT Time Calculation  PT Start Time 1049  PT Stop Time 1121  PT Time Calculation (min) 32 min  Subjective Data  Subjective "I feel better."  Precautions  Precautions Fall;Posterior Hip  Precaution Booklet Issued Yes (comment)  Precaution Comments handout posted in pt's room, and reviewed with pt and daughter  Restrictions  Weight Bearing Restrictions Yes  RLE Weight Bearing NWB  LLE Weight Bearing WBAT  Pain Assessment  Pain Assessment Faces  Pain Score 6  Faces Pain Scale 6  Pain Location left hip and ribs  Pain Descriptors / Indicators Aching;Grimacing;Guarding  Pain Intervention(s) Limited activity within patient's tolerance;Monitored during session;Repositioned;Patient requesting pain meds-RN notified  Cognition  Arousal/Alertness Awake/alert  Behavior During Therapy WFL for tasks assessed/performed  Overall Cognitive Status Impaired/Different from baseline  Area of Impairment Orientation;Attention;Memory;Following commands;Safety/judgement;Awareness;Problem solving  Orientation Level Disoriented to;Time;Situation  Current  Attention Level Focused;Sustained (variable attention )  Memory Decreased recall of precautions;Decreased short-term memory  Following Commands Follows one step commands inconsistently  Safety/Judgement Decreased awareness of safety;Decreased awareness of deficits  Problem Solving Slow processing;Decreased initiation;Difficulty sequencing;Requires verbal cues;Requires tactile cues  General Comments Pt is pleasantly confused during eval.  He has difficulty sustaining attention to task at hand, and is easily distracted   Bed Mobility  Overal bed mobility Needs Assistance;+2 for physical assistance  Bed Mobility Rolling;Sit to Supine  Rolling Total assist;+2 for physical assistance  Sit to supine Total assist;+2 for physical assistance  General bed mobility comments requires assist for all aspects  Transfers  Overall transfer level Needs assistance  Transfers Lateral/Scoot Transfers  Lateral/Scoot Transfers With slide board;Total assist;+2 physical assistance  General transfer comment Requires assist for all aspects and a third person to hold Rt. LE to prevent WBing.  Pt leans posteriorly during transfer placing.  Requires step by step cues for safety and technique  Balance  Overall balance assessment Needs assistance;History of Falls  Sitting-balance support Single extremity supported;Feet supported  Sitting balance-Leahy Scale Poor  Sitting balance - Comments Pt with posterior lean   Postural control Posterior lean;Right lateral lean  Standing balance comment unable to achieve standing   PT - End of Session  Equipment Utilized During Treatment Gait belt;Oxygen  Activity Tolerance Patient limited by fatigue;Patient limited by pain  Patient left with call bell/phone within reach;with family/visitor present;in bed  Nurse Communication Mobility status;Weight bearing status;Precautions  PT - Assessment/Plan  PT Plan Current plan remains appropriate  PT Frequency Min 3X/week  Follow Up  Recommendations SNF;Supervision/Assistance - 24 hour  PT equipment None recommended by PT  PT Goal Progression  Progress towards PT  goals Not progressing toward goals - comment (due to fatigue)  PT General Charges  $$ ACUTE PT VISIT 1 Procedure  PT Treatments  $Therapeutic Activity 23-37 mins  Patra Gherardi,PT Acute Rehabilitation [phone] (pager)

## 2014-06-02 NOTE — Progress Notes (Signed)
SUBJECTIVE:  Pt seen and examined in AM. Pt tolerated left hip arthroplasty with no complications. Telemetry with no events and he remains in NSR. He has chronic pain at baseline.      OBJECTIVE:   Vitals:   Filed Vitals:   06/02/14 0016 06/02/14 0353 06/02/14 0756 06/02/14 0800  BP:   123/63   Pulse:   72   Temp: 98.3 F (36.8 C) 97.3 F (36.3 C) 98 F (36.7 C)   TempSrc: Oral Oral Oral   Resp:   28 16  Height:      Weight:      SpO2:   98% 4%   I&O's:    Intake/Output Summary (Last 24 hours) at 06/02/14 1039 Last data filed at 06/02/14 0600  Gross per 24 hour  Intake 1395.5 ml  Output    860 ml  Net  535.5 ml   TELEMETRY: Reviewed telemetry pt in NSR:     PHYSICAL EXAM General: Well developed, well nourished, in no acute distress Head:   Normal cephalic and atramatic  Lungs:   Clear bilaterally to auscultation. Heart:  HRRR S1 S2  No JVD.   Abdomen: abdomen soft and non-tender Msk:  Back normal,  Normal strength and tone for age. Extremities:   No edema.   Neuro: Alert, confused at baseline Psych: Dementia  Skin: No rash   LABS: Basic Metabolic Panel:  Recent Labs  06/01/14 0259 06/02/14 0319  NA 139 141  K 4.9 4.9  CL 103 106  CO2 22 21  GLUCOSE 196* 181*  BUN 26* 30*  CREATININE 1.19 1.10  CALCIUM 8.8 8.9   Liver Function Tests:  Recent Labs  05/31/14 0315 06/01/14 0259  AST 17 24  ALT 12 12  ALKPHOS 94 84  BILITOT 0.4 0.5  PROT 6.9 6.7  ALBUMIN 3.3* 3.1*   No results for input(s): LIPASE, AMYLASE in the last 72 hours. CBC:  Recent Labs  05/30/14 1701 05/31/14 0315  06/01/14 0259 06/02/14 0319  WBC 9.6 9.9  < > 8.8 7.7  NEUTROABS 7.9* 8.5*  --   --   --   HGB 12.4* 12.1*  < > 11.6* 10.1*  HCT 37.9* 37.4*  < > 35.8* 31.5*  MCV 89.4 89.5  < > 89.5 89.2  PLT 156 151  < > 141* 140*  < > = values in this interval not displayed. Cardiac Enzymes:  Recent Labs  05/31/14 0615 05/31/14 1235 05/31/14 1649  TROPONINI  0.75* 0.51* 0.52*   BNP: Invalid input(s): POCBNP D-Dimer: No results for input(s): DDIMER in the last 72 hours. Hemoglobin A1C: No results for input(s): HGBA1C in the last 72 hours. Fasting Lipid Panel: No results for input(s): CHOL, HDL, LDLCALC, TRIG, CHOLHDL, LDLDIRECT in the last 72 hours. Thyroid Function Tests: No results for input(s): TSH, T4TOTAL, T3FREE, THYROIDAB in the last 72 hours.  Invalid input(s): FREET3 Anemia Panel: No results for input(s): VITAMINB12, FOLATE, FERRITIN, TIBC, IRON, RETICCTPCT in the last 72 hours. Coag Panel:   Lab Results  Component Value Date   INR 1.06 05/31/2014   INR 1.06 10/01/2013    RADIOLOGY: Dg Thoracic Spine 2 View  05/30/2014   CLINICAL DATA:  Fall, mid back pain  EXAM: THORACIC SPINE - 2 VIEW  COMPARISON:  None.  FINDINGS: Normal thoracic kyphosis.  No evidence of fracture dislocation. Vertebral body heights are maintained.  Moderate multilevel degenerative changes.  Vascular calcifications.  Visualized lungs are clear.  IMPRESSION: No fracture  or dislocation is seen.  Moderate multilevel degenerative changes.   Electronically Signed   By: Julian Hy M.D.   On: 05/30/2014 19:23   Dg Lumbar Spine Complete  05/30/2014   CLINICAL DATA:  Fall, mid/lower back pain  EXAM: LUMBAR SPINE - COMPLETE 4+ VIEW  COMPARISON:  CT abdomen dated 08/27/2013  FINDINGS: Five lumbar type vertebral bodies.  Normal lumbar lordosis.  No evidence of fracture or dislocation. Vertebral body heights are maintained.  Grade 1 anterolisthesis of L4 on L5.  Moderate multilevel degenerative changes.  Visualized bony pelvis appears intact.  Vascular calcifications with known infrarenal abdominal aortic aneurysm.a  IMPRESSION: No fracture or dislocation is seen.  Moderate degenerative changes.  Grade 1 anterolisthesis of L4 on L5.   Electronically Signed   By: Julian Hy M.D.   On: 05/30/2014 20:01   Dg Shoulder Right  05/30/2014   CLINICAL DATA:  Fall, right  shoulder pain  EXAM: RIGHT SHOULDER - 2+ VIEW  COMPARISON:  None.  FINDINGS: No fracture or dislocation is seen.  Moderate degenerative changes of the glenohumeral joint and acromioclavicular joint  The visualized soft tissues are unremarkable.  Visualized right lung is clear.  IMPRESSION: No fracture or dislocation is seen.  Moderate degenerative changes.   Electronically Signed   By: Julian Hy M.D.   On: 05/30/2014 19:22   Dg Hip Complete Left  05/30/2014   CLINICAL DATA:  Left hip pain after falling at home today at 1500 hr.  EXAM: LEFT HIP - COMPLETE 2+ VIEW  COMPARISON:  Portable pelvis obtained earlier today.  FINDINGS: Left femoral neck fracture with mild valgus angulation. Atheromatous arterial calcifications.  IMPRESSION: Previously noted mildly displaced left femoral neck fracture with mild valgus angulation.   Electronically Signed   By: Enrique Sack M.D.   On: 05/30/2014 19:21   Ct Head Wo Contrast  05/30/2014   CLINICAL DATA:  Right neck pain after falling today and hitting his shoulder against a door jamb.  EXAM: CT HEAD WITHOUT CONTRAST  CT CERVICAL SPINE WITHOUT CONTRAST  TECHNIQUE: Multidetector CT imaging of the head and cervical spine was performed following the standard protocol without intravenous contrast. Multiplanar CT image reconstructions of the cervical spine were also generated.  COMPARISON:  10/01/2013.  FINDINGS: CT HEAD FINDINGS  Diffusely enlarged ventricles and subarachnoid spaces. Patchy white matter low density in both cerebral hemispheres. No skull fracture, intracranial hemorrhage or paranasal sinus air-fluid levels. Dense arterial calcifications at the skull base.  CT CERVICAL SPINE FINDINGS  Extensive multilevel cervical spine degenerative changes. No prevertebral soft tissue swelling, fractures or subluxations. Bilateral vertebral and carotid artery atheromatous calcifications. Bullous changes at both lung apices.  IMPRESSION: 1. No skull fracture or intracranial  hemorrhage. 2. No cervical spine fracture or subluxation. 3. Stable mid atrophy and chronic small vessel white matter ischemic changes. 4. Stable marked cervical spine degenerative changes. 5. COPD.   Electronically Signed   By: Enrique Sack M.D.   On: 05/30/2014 19:07   Ct Chest W Contrast  05/30/2014   CLINICAL DATA:  [place] today. Complaining of right shoulder and left hip pain.  EXAM: CT CHEST, ABDOMEN, AND PELVIS WITH CONTRAST  TECHNIQUE: Multidetector CT imaging of the chest, abdomen and pelvis was performed following the standard protocol during bolus administration of intravenous contrast.  CONTRAST:  151mL OMNIPAQUE IOHEXOL 300 MG/ML  SOLN  COMPARISON:  Chest CT, 08/30/2013.  FINDINGS: CT CHEST FINDINGS  Heart is mildly enlarged. There are dense coronary artery  and mitral valve annular calcifications. Aorta is normal in caliber. There is partly calcified mild plaque along the thoracic aorta and at the origin of the left subclavian artery. No significant stenosis.  Right left pulmonary arteries are dilated to 3 cm, stable.  Borderline enlarged right peritracheal lymph node measures 1 cm short axis, stable. No mediastinal masses. No hilar masses or adenopathy.  Lungs show heterogeneous coarse peripheral reticular opacities consistent with interstitial fibrosis, with a pattern suggesting UIP. Focal opacity noted in the left lower lobe on the prior exam measuring 15 mm x 7 mm, is without significant change. No acute lung consolidation or evidence of edema. No pleural effusion or pneumothorax.  CT ABDOMEN AND PELVIS FINDINGS  Liver shows morphologic changes consistent with cirrhosis. No liver mass or focal lesion. No contusion or laceration.  Mean is prominent measuring 12.4 cm in greatest dimension. No splenic mass or focal lesion.  Gallbladder is unremarkable. No bile duct dilation. No pancreatic masses or inflammation. No adrenal masses.  Mild bilateral renal cortical thinning. There is a peripherally  calcified hypo attenuating, 3.1 cm mass protruding from the inferior margin of the left kidney, not imaged on the prior chest CT. This is likely a mildly complicated cyst. There is a low attenuation lesion in the upper pole left kidney, most likely a simple cyst. No other renal masses or lesions. No hydronephrosis. Normal ureters. Bladder is unremarkable.  There is an infrarenal abdominal aortic aneurysm measuring 3.7 cm in greatest anterior-posterior dimension. No evidence of rupture. Atherosclerotic changes noted along the abdominal aorta and its branch vessels.  No pathologically enlarged lymph nodes. No abnormal fluid collections. Specifically no evidence of hemo peritoneum.  No bowel wall thickening to suggest a bowel hematoma. No bowel inflammatory changes. No mesenteric inflammation or contusion/hematoma.  MUSCULOSKELETAL:  There fractures of the right acetabulum. Fracture extends across the posterior column, extending across the medial acetabulum to the anterior,. Fracture is mildly distracted by 7 mm. There is mild fracture comminution with small fracture fragments noted between the major fracture components, at least 1 within the hip joint.  There is a fracture of the left femoral neck. This is subcapital, oblique and non comminuted. Fracture is displaced with the distal fracture component migrating superiorly by 17 mm. There is also mild varus angulation as well as significant apex anterior angulation.  No other pelvic or proximal femur fractures.  There are no other fractures.  IMPRESSION: 1. Fractures of the right acetabulum involving the anterior posterior columns. 2. Fracture of the left femoral neck, mildly displaced and angulated. 3. No other fractures. 4. No other acute findings in the chest, abdomen or pelvis. No other evidence of acute injury. 5. Chronic findings include cardiomegaly, coronary artery calcifications and changes consistent with interstitial fibrosis. Pulmonary artery dilation  suggest pulmonary artery hypertension. Below the diaphragm, there changes of cirrhosis with borderline splenomegaly. Probable left renal cysts. 6. 3.7 cm infrarenal abdominal aortic aneurysm. No evidence of rupture.   Electronically Signed   By: Lajean Manes M.D.   On: 05/30/2014 22:06   Ct Cervical Spine Wo Contrast  05/30/2014   CLINICAL DATA:  Right neck pain after falling today and hitting his shoulder against a door jamb.  EXAM: CT HEAD WITHOUT CONTRAST  CT CERVICAL SPINE WITHOUT CONTRAST  TECHNIQUE: Multidetector CT imaging of the head and cervical spine was performed following the standard protocol without intravenous contrast. Multiplanar CT image reconstructions of the cervical spine were also generated.  COMPARISON:  10/01/2013.  FINDINGS: CT HEAD FINDINGS  Diffusely enlarged ventricles and subarachnoid spaces. Patchy white matter low density in both cerebral hemispheres. No skull fracture, intracranial hemorrhage or paranasal sinus air-fluid levels. Dense arterial calcifications at the skull base.  CT CERVICAL SPINE FINDINGS  Extensive multilevel cervical spine degenerative changes. No prevertebral soft tissue swelling, fractures or subluxations. Bilateral vertebral and carotid artery atheromatous calcifications. Bullous changes at both lung apices.  IMPRESSION: 1. No skull fracture or intracranial hemorrhage. 2. No cervical spine fracture or subluxation. 3. Stable mid atrophy and chronic small vessel white matter ischemic changes. 4. Stable marked cervical spine degenerative changes. 5. COPD.   Electronically Signed   By: Enrique Sack M.D.   On: 05/30/2014 19:07   Ct Abdomen Pelvis W Contrast  05/30/2014   CLINICAL DATA:  [place] today. Complaining of right shoulder and left hip pain.  EXAM: CT CHEST, ABDOMEN, AND PELVIS WITH CONTRAST  TECHNIQUE: Multidetector CT imaging of the chest, abdomen and pelvis was performed following the standard protocol during bolus administration of intravenous contrast.   CONTRAST:  14mL OMNIPAQUE IOHEXOL 300 MG/ML  SOLN  COMPARISON:  Chest CT, 08/30/2013.  FINDINGS: CT CHEST FINDINGS  Heart is mildly enlarged. There are dense coronary artery and mitral valve annular calcifications. Aorta is normal in caliber. There is partly calcified mild plaque along the thoracic aorta and at the origin of the left subclavian artery. No significant stenosis.  Right left pulmonary arteries are dilated to 3 cm, stable.  Borderline enlarged right peritracheal lymph node measures 1 cm short axis, stable. No mediastinal masses. No hilar masses or adenopathy.  Lungs show heterogeneous coarse peripheral reticular opacities consistent with interstitial fibrosis, with a pattern suggesting UIP. Focal opacity noted in the left lower lobe on the prior exam measuring 15 mm x 7 mm, is without significant change. No acute lung consolidation or evidence of edema. No pleural effusion or pneumothorax.  CT ABDOMEN AND PELVIS FINDINGS  Liver shows morphologic changes consistent with cirrhosis. No liver mass or focal lesion. No contusion or laceration.  Mean is prominent measuring 12.4 cm in greatest dimension. No splenic mass or focal lesion.  Gallbladder is unremarkable. No bile duct dilation. No pancreatic masses or inflammation. No adrenal masses.  Mild bilateral renal cortical thinning. There is a peripherally calcified hypo attenuating, 3.1 cm mass protruding from the inferior margin of the left kidney, not imaged on the prior chest CT. This is likely a mildly complicated cyst. There is a low attenuation lesion in the upper pole left kidney, most likely a simple cyst. No other renal masses or lesions. No hydronephrosis. Normal ureters. Bladder is unremarkable.  There is an infrarenal abdominal aortic aneurysm measuring 3.7 cm in greatest anterior-posterior dimension. No evidence of rupture. Atherosclerotic changes noted along the abdominal aorta and its branch vessels.  No pathologically enlarged lymph nodes.  No abnormal fluid collections. Specifically no evidence of hemo peritoneum.  No bowel wall thickening to suggest a bowel hematoma. No bowel inflammatory changes. No mesenteric inflammation or contusion/hematoma.  MUSCULOSKELETAL:  There fractures of the right acetabulum. Fracture extends across the posterior column, extending across the medial acetabulum to the anterior,. Fracture is mildly distracted by 7 mm. There is mild fracture comminution with small fracture fragments noted between the major fracture components, at least 1 within the hip joint.  There is a fracture of the left femoral neck. This is subcapital, oblique and non comminuted. Fracture is displaced with the distal fracture component migrating superiorly by 17 mm.  There is also mild varus angulation as well as significant apex anterior angulation.  No other pelvic or proximal femur fractures.  There are no other fractures.  IMPRESSION: 1. Fractures of the right acetabulum involving the anterior posterior columns. 2. Fracture of the left femoral neck, mildly displaced and angulated. 3. No other fractures. 4. No other acute findings in the chest, abdomen or pelvis. No other evidence of acute injury. 5. Chronic findings include cardiomegaly, coronary artery calcifications and changes consistent with interstitial fibrosis. Pulmonary artery dilation suggest pulmonary artery hypertension. Below the diaphragm, there changes of cirrhosis with borderline splenomegaly. Probable left renal cysts. 6. 3.7 cm infrarenal abdominal aortic aneurysm. No evidence of rupture.   Electronically Signed   By: Lajean Manes M.D.   On: 05/30/2014 22:06   Pelvis Portable  06/01/2014   CLINICAL DATA:  Postop left total hip.  EXAM: PORTABLE PELVIS 1-2 VIEWS  COMPARISON:  05/31/2014  FINDINGS: Patient has undergone total hip arthroplasty. Left hip prosthesis appears well seated. No evidence for dislocation on this frontal view. There is asymmetry of the SI joint, likely  related to pelvic fractures. Right acetabular fracture again noted. There is deformity of the right inferior pubic ramus.  IMPRESSION: 1. Status post left total hip arthroplasty. 2. No evidence for dislocation on this frontal view.   Electronically Signed   By: Shon Hale M.D.   On: 06/01/2014 17:07   Dg Pelvis Portable  05/30/2014   CLINICAL DATA:  [place] today at 3 o'clock. Tripped over a door way. Left hip pain.  EXAM: PORTABLE PELVIS 1-2 VIEWS PORTABLE ONE-VIEW CHEST X-RAY.  COMPARISON:  HIP RADIOGRAPHS 10/01/2013 AND CHEST X-RAY 08/26/2013.  FINDINGS: CHEST X-RAY:  The heart is mildly enlarged but stable. There is tortuosity and calcification of the thoracic aorta. Chronic lung changes with pulmonary fibrosis. No definite acute overlying pulmonary process. The bony thorax is grossly intact.  Pelvis:  There is a displaced left femoral neck fracture. The right hip is intact. Moderate degenerative changes bilaterally, right greater than left. The pubic symphysis and SI joints are intact. No pelvic fractures. Extensive vascular calcifications are noted.  IMPRESSION: 1. Displaced left femoral neck fracture. 2. No acute cardiopulmonary findings. Chronic lung changes/pulmonary fibrosis.   Electronically Signed   By: Kalman Jewels M.D.   On: 05/30/2014 17:36   Dg Pelvis Comp Min 3v  05/31/2014   CLINICAL DATA:  Right acetabular fracture  EXAM: JUDET PELVIS - 3+ VIEW  COMPARISON:  05/30/2014  FINDINGS: Again noted changes consistent with a left femoral neck fracture with impaction. The right acetabular fracture is again noted and unchanged. No right femoral abnormality seen.  IMPRESSION: Right acetabular fracture stable from the prior exam.  Left femoral neck fracture stable from the prior exam.   Electronically Signed   By: Inez Catalina M.D.   On: 05/31/2014 11:34   Ct 3d Recon At Scanner  05/31/2014   CLINICAL DATA:  Nonspecific (abnormal) findings on radiological and other examination of musculoskeletal  system. Right acetabular fracture. Left femoral neck fracture.  EXAM: 3-DIMENSIONAL CT IMAGE RENDERING ON ACQUISITION WORKSTATION  TECHNIQUE: 3-dimensional CT images were rendered by post-processing of the original CT data on an acquisition workstation.  COMPARISON:  CT scan and radiographs dated 05/30/2014  FINDINGS: There is a oblique vertical fracture through the medial aspect of the right acetabulum the with approximately 8 mm of distraction. There is also a subtle old healed fracture of the right inferior pubic ramus. I do not  think these are acute.  There is an acute subcapital fracture of the left femoral neck with impaction and angulation. There are slight arthritic changes of the superior lateral aspect of the left acetabulum.  The 3D images better demonstrate the right acetabular fracture.  IMPRESSION: Slightly distracted subacute fracture through the medial aspect of the right acetabulum. Old healed fracture of the low right inferior pubic ramus.   Electronically Signed   By: Rozetta Nunnery M.D.   On: 05/31/2014 11:37   Dg Chest Port 1 View  05/31/2014   CLINICAL DATA:  80 year old male with low oxygen level. Subsequent encounter.  EXAM: PORTABLE CHEST - 1 VIEW  COMPARISON:  05/30/2014 and 08/26/2013.  FINDINGS: Pulmonary vascular congestion superimposed upon chronic lung changes.  Elevated left hemidiaphragm with left base subsegmental atelectasis.  No gross pneumothorax.  Cardiomegaly.  Calcified mildly tortuous aorta.  IMPRESSION: Pulmonary vascular congestion superimposed upon chronic lung changes.  Elevated left hemidiaphragm with left base subsegmental atelectasis.  No gross pneumothorax.  Cardiomegaly.  Calcified mildly tortuous aorta.   Electronically Signed   By: Chauncey Cruel M.D.   On: 05/31/2014 06:55   Dg Chest Port 1 View  05/30/2014   CLINICAL DATA:  80 year old male who fell at 1500 hr today tripping over door way. Acute pain. Initial encounter.  EXAM: PORTABLE CHEST - 1 VIEW   COMPARISON:  Chest CT 08/30/2013 and earlier.  FINDINGS: Portable AP semi upright view at 1716 hr. Lower lung volumes. Stable cardiomegaly and mediastinal contours. Peripheral increased opacity greater in the left lung is stable. No pneumothorax, pulmonary edema, or pleural effusion identified. No acute pulmonary opacity identified. Calcified atherosclerosis of the aorta. Visualized tracheal air column is within normal limits. No acute osseous abnormality identified.  IMPRESSION: Cardiomegaly and chronic lung disease. No superimposed acute findings are identified.   Electronically Signed   By: Lars Pinks M.D.   On: 05/30/2014 17:35      ASSESSMENT: 80-yo man with pmh of HTN, HL, T2DM, AAA, COPD, Afib s/p ablation who presented on 11/2 with left displaced femoral neck fracture in setting of mechanical fall found to have elevated troponin in setting of demand ischemia. Pt tolerated left hip arthoplasty on 11/4 with no cardiac complications.   PLAN:    Type II NSTEMI - Currently with no CP. Etiology due to demand ischemia in setting of acute hypoxic respiratory failure and recent left hip fracture. On aspirin 81 mg daily for DVT ppx for hip replacement and rosuvastatin 10 mg daily for HL.  Atrial Fibrillation s/p ablation - Currently in NSR. Continue home sotalol 80 mg BID. Left Femoral Neck Fracture s/p  total hip arthroplasty on 11/4 - Ortho following. Left hip xray normal today. Pt with calculated 0.9% perioperative cardiac risk [employer] Safe risk calculator) and tolerated surgery with no cardiac events. Pt is on aspirin 81 mg daily for DVT ppx s/p surgery.  Grade 1 diastolic dysfunction - 2D-echo with grade 1 diastolic dysfunction with normal EF 50-55%. Pro-BNP elevated at >7000 and CXR with pulmonary congestion. Caution with IV fluids, currently on LR 50 mL/hr per primary team.  Acute hypoxic respiratory failure in setting of COPD - Improved. Currently on oxygen therapy with no acute COPD exacerbation. CXR  with pulmonary venous congestion in setting of grade 1 diastolic dysfunction. . Caution with IV fluids, currently on LR 50 mL/hr per primary team.  Hypertension - Currently normotensive. Continue finasteride 5 mg for BPH.  Hyperlipidemia - No recent lipid panel. Continue home rosuvastatin  10 mg daily.  Osteoporosis and Vitamin D Insufficiency in setting of left hip fracture  -  VItamin D level 24. Continue Vitamin D 1000 U daily and start oscal 2 tablets daily.  Questionable UTI - Currently on IV ceftriaxone per primary team, would discontinue if cultures with no growth.  Non-insulin Dependent Type II DM - Hold home metformin and glipizide. Management per primary team.  Juluis Mire, MD  PGY-II IMTS Pager [phone] 06/02/2014  10:39 AM  I have examined the patient and reviewed assessment and plan and discussed with patient.  Agree with above as stated.  Pro-BNP elevated at >7000 and CXR with pulmonary congestion. Caution with IV fluids, currently on LR 50 mL/hr per primary team. Avoid fluid overload.  Otherwise, he tolerated surgery well from a cardiac standpoint   Doreene Forrey S.

## 2014-06-02 NOTE — Evaluation (Signed)
Physical Therapy Evaluation Patient Details Name: Alcide Memoli MRN: [id number] DOB: Aug 23, 1926 Today's Date: 06/02/2014   History of Present Illness  Sonny Anthes is a 80 y.o. male with history of atrial fibrillation status post ablation, diabetes mellitus, hypertension and hyperlipidemia had a fall at his house when he was trying to cross the door. Patient did not hit his head or lose consciousness and patient was brought to the ER. In the ER workup revealed left hip fracture and also right acetabular fracture.  Pt underwent  Bipolar arthroplasty of the Lt. hip  Clinical Impression  Pt admitted with above. Pt currently with functional limitations due to the deficits listed below (see PT Problem List). Pt with weight bearing restrictions and will need short term SNF for recovery prior to d/c home.   Pt will benefit from skilled PT to increase their independence and safety with mobility to allow discharge to the venue listed below.     Follow Up Recommendations SNF;Supervision/Assistance - 24 hour    Equipment Recommendations  None recommended by PT    Recommendations for Other Services       Precautions / Restrictions Precautions Precautions: Fall;Posterior Hip Precaution Booklet Issued: Yes (comment) Precaution Comments: handout posted in pt's room, and reviewed with pt and daughter Restrictions Weight Bearing Restrictions: Yes RLE Weight Bearing: Non weight bearing LLE Weight Bearing: Weight bearing as tolerated      Mobility  Bed Mobility Overal bed mobility: Needs Assistance;+2 for physical assistance Bed Mobility: Rolling;Supine to Sit Rolling: Total assist;+2 for physical assistance   Supine to sit: Max assist;+2 for physical assistance Sit to supine: Total assist;+2 for physical assistance   General bed mobility comments: requires assist for all aspects  Transfers Overall transfer level: Needs assistance Equipment used: Rolling walker (2 wheeled);None Transfers:  Sit to/from [employer] Sit to Stand: Max assist;+2 physical assistance   Squat pivot transfers: Max assist;+2 physical assistance    Lateral/Scoot Transfers: With slide board;Total assist;+2 physical assistance General transfer comment: Attempted to stand to RW initially however pt was unable to weight bear enough on left LE even though MD allows WBAT.  Pt could barely clear buttocks off bed.  Decided to perform squat pivot with pad bed to wheelchair going to left.  Pt unable to assist much with this as well and was pale after transfer with VSS except for O2 sat 88% therefore replaced 3LO2.  Pt needs constant cueing for hip precautions as he was not remembering any precautions.  Needed total assist and incr time to get pt adjusted and comfortable in chair.  Had to place trash can  with pillow on it  under pts LEs as wheelchair did not have appropriate footrests.    Ambulation/Gait                Stairs            Wheelchair Mobility    Modified Rankin (Stroke Patients Only)       Balance Overall balance assessment: Needs assistance Sitting-balance support: Bilateral upper extremity supported;Feet supported Sitting balance-Leahy Scale: Poor Sitting balance - Comments: Pt with posterior lean and right lateral lean. Postural control: Posterior lean;Right lateral lean     Standing balance comment: unable to achieve standing.                               Pertinent Vitals/Pain Pain Assessment: Faces Faces Pain Scale: Hurts even more Pain Location:  left hip  Pain Descriptors / Indicators: Aching;Grimacing;Guarding Pain Intervention(s): Limited activity within patient's tolerance;Monitored during session;Premedicated before session;Repositioned  Initial BP 149/65, 95% O2 on 3L and O2 on RA 94%, HR 80 bpm.  Post transfer 142/48 BP with SaO2 drop to 88-90% therefore replaced O2 with sat 98%.  HR 86 bpm.      Home Living Family/patient expects to  be discharged to:: Skilled nursing facility Living Arrangements: Alone (lives next to daughter and son in law) Available Help at Discharge: Available PRN/intermittently Type of Home: House Home Access: Stairs to enter Entrance Stairs-Rails: None Entrance Stairs-Number of Steps: 1 stoop Home Layout: One level Home Equipment: Cane - single point;Walker - 2 wheels;Bedside commode;Shower seat - built in;Hand held shower head;Grab bars - tub/shower Additional Comments: Pt lived in apt attached to daughter's home.  Home is fully w/c accessible     Prior Function Level of Independence: Independent         Comments: Family states that pt had a fall in Feb and took in home therapy until April.  Pt was not using device just prior to coming into hospital.  Pt went to [place] for social outing and exercises.  Pt drove as well.       Hand Dominance   Dominant Hand: Right    Extremity/Trunk Assessment   Upper Extremity Assessment: Defer to OT evaluation           Lower Extremity Assessment: RLE deficits/detail;LLE deficits/detail RLE Deficits / Details: grossly 3/5 LLE Deficits / Details: grossly 2-/5     Communication   Communication: No difficulties  Cognition Arousal/Alertness: Awake/alert Behavior During Therapy: WFL for tasks assessed/performed Overall Cognitive Status: Impaired/Different from baseline Area of Impairment: Orientation;Attention;Memory;Following commands;Safety/judgement;Awareness;Problem solving Orientation Level: Disoriented to;Time;Situation Current Attention Level: Focused;Sustained (variable attention ) Memory: Decreased recall of precautions;Decreased short-term memory Following Commands: Follows one step commands inconsistently Safety/Judgement: Decreased awareness of safety;Decreased awareness of deficits   Problem Solving: Slow processing;Decreased initiation;Difficulty sequencing;Requires verbal cues;Requires tactile cues General Comments: Pt is  pleasantly confused during eval.  He has difficulty sustaining attention to task at hand, and is easily distracted     General Comments General comments (skin integrity, edema, etc.): old skin tears noted on hands.  Pt also had pulled IV out prior to PT  coming in room.  Nursing aware.      Exercises        Assessment/Plan    PT Assessment Patient needs continued PT services  PT Diagnosis Generalized weakness;Acute pain   PT Problem List Decreased activity tolerance;Decreased balance;Decreased mobility;Decreased knowledge of use of DME;Decreased safety awareness;Decreased knowledge of precautions;Pain;Cardiopulmonary status limiting activity  PT Treatment Interventions DME instruction;Gait training;Functional mobility training;Therapeutic activities;Therapeutic exercise;Balance training;Patient/family education   PT Goals (Current goals can be found in the Care Plan section) Acute Rehab PT Goals Patient Stated Goal: Pt did not state PT Goal Formulation: With patient Time For Goal Achievement: 06/16/14 Potential to Achieve Goals: Good    Frequency Min 3X/week   Barriers to discharge Decreased caregiver support      Co-evaluation   Reason for Co-Treatment: For patient/therapist safety;Complexity of the patient's impairments (multi-system involvement)   OT goals addressed during session: Strengthening/ROM       End of Session Equipment Utilized During Treatment: Gait belt;Oxygen Activity Tolerance: Patient limited by fatigue;Patient limited by pain Patient left: in chair;with call bell/phone within reach;with family/visitor present Nurse Communication: Mobility status;Weight bearing status;PrecautionsNotified nursing of ventricular bigeminy and nursing aware.  Time: 0902-0951 PT Time Calculation (min): 49 min   Charges:   PT Evaluation $Initial PT Evaluation Tier I: 1 Procedure PT Treatments $Therapeutic Activity: 23-37 mins $Self Care/Home Management:  8-22   PT G CodesDenice Paradise 2014/07/02, 1:54 PM [place] Harleen Fineberg,PT Acute Rehabilitation [phone] (pager)

## 2014-06-02 NOTE — Evaluation (Signed)
Occupational Therapy Evaluation Patient Details Name: [name] [name] MRN: [id number] DOB: [birth year] Today's Date: 06/02/2014    History of Present Illness [name] [name] is a 80 y.o. male with history of atrial fibrillation status post ablation, diabetes mellitus, hypertension and hyperlipidemia had a fall at his house when he was trying to cross the door. Patient did not hit his head or lose consciousness and patient was brought to the ER. In the ER workup revealed left hip fracture and also right acetabular fracture.  Pt underwent  Bipolar arthroplasty of the Lt. hip   Clinical Impression   Pt admitted with above. He demonstrates the below listed deficits and will benefit from continued OT to maximize safety and independence with BADLs.  Pt currently with confusion.  Unable to recall [place] or THA precautions.  He was able to perform sliding board/scoot transfer with total A +3.  He currently requires total A for BADLs.  Recommend SNF level rehab at discharge.      Follow Up Recommendations  SNF    Equipment Recommendations  None recommended by OT    Recommendations for Other Services       Precautions / Restrictions Precautions Precautions: Fall;Posterior Hip Precaution Booklet Issued: Yes (comment) Precaution Comments: handout posted in pt's room, and reviewed with pt and daughter Restrictions Weight Bearing Restrictions: Yes RLE Weight Bearing: Non weight bearing LLE Weight Bearing: Weight bearing as tolerated      Mobility Bed Mobility Overal bed mobility: Needs Assistance;+2 for physical assistance Bed Mobility: Rolling;Sit to Supine Rolling: Total assist;+2 for physical assistance     Sit to supine: Total assist;+2 for physical assistance   General bed mobility comments: requires assist for all aspects  Transfers Overall transfer level: Needs assistance   Transfers: Lateral/Scoot Transfers          Lateral/Scoot Transfers: With slide board;Total assist;+2  physical assistance General transfer comment: Requires assist for all aspects and a third person to hold Rt. LE to prevent WBing.  Pt leans posteriorly during transfer placing.  Requires step by step cues for safety and technique    Balance Overall balance assessment: Needs assistance Sitting-balance support: Feet supported;Single extremity supported Sitting balance-Leahy Scale: Poor Sitting balance - Comments: Pt with posterior lean  Postural control: Posterior lean     Standing balance comment: unable to achieve standing                             ADL Overall ADL's : Needs assistance/impaired Eating/Feeding: Moderate assistance;Bed level   Grooming: Wash/dry hands;Wash/dry face;Minimal assistance;Sitting;Bed level   Upper Body Bathing: Maximal assistance;Bed level   Lower Body Bathing: Total assistance;Bed level   Upper Body Dressing : Total assistance;Bed level   Lower Body Dressing: Total assistance;Bed level   Toilet Transfer: Total assistance;+2 for physical assistance (sliding board/scoot transfer) Toilet Transfer Details (indicate cue type and reason): Pt requires +3 assist for transfer.  He is unable to maintain NWB on Rt. LE without assist.  Pt able to help only minimally  Toileting- Clothing Manipulation and Hygiene: Total assistance;Bed level       Functional mobility during ADLs: Total assistance;+2 for physical assistance General ADL Comments: Pt unable to recall THA precautions or WBIng precautions.       Vision                     Perception     Praxis      Pertinent Vitals/Pain  Pain Assessment: Faces Faces Pain Scale: Hurts even more Pain Location: Lt hip and ribs/lateral chest Pain Descriptors / Indicators: Grimacing Pain Intervention(s): Monitored during session;Repositioned;Patient requesting pain meds-RN notified     Hand Dominance Right   Extremity/Trunk Assessment Upper Extremity Assessment Upper Extremity Assessment:  Generalized weakness   Lower Extremity Assessment Lower Extremity Assessment: Defer to PT evaluation       Communication Communication Communication: No difficulties   Cognition Arousal/Alertness: Awake/alert Behavior During Therapy: WFL for tasks assessed/performed Overall Cognitive Status: Impaired/Different from baseline Area of Impairment: Orientation;Attention;Memory;Following commands;Safety/judgement;Awareness;Problem solving Orientation Level: Disoriented to;Time;Situation Current Attention Level: Focused;Sustained (variable attention ) Memory: Decreased recall of precautions;Decreased short-term memory Following Commands: Follows one step commands inconsistently Safety/Judgement: Decreased awareness of safety;Decreased awareness of deficits   Problem Solving: Slow processing;Decreased initiation;Difficulty sequencing;Requires verbal cues;Requires tactile cues General Comments: Pt is pleasantly confused during OT eval.  He has difficulty sustaining attention to task at hand, and is easily distracted    General Comments       Exercises       Shoulder Instructions      Home Living Family/patient expects to be discharged to:: Skilled nursing facility Living Arrangements: Alone (lives next to daughter and son in law)                               Additional Comments: Pt lived in apt attached to daughter's home.  Home is fully w/c accessible       Prior Functioning/Environment Level of Independence: Independent        Comments: Pt was still driving [employer], and active in the community.      OT Diagnosis: Generalized weakness;Cognitive deficits;Acute pain   OT Problem List: Decreased strength;Decreased activity tolerance;Impaired balance (sitting and/or standing);Decreased cognition;Decreased safety awareness;Decreased knowledge of use of DME or AE;Decreased knowledge of precautions;Pain   OT Treatment/Interventions: Self-care/ADL training;DME and/or AE  instruction;Therapeutic activities;Cognitive remediation/compensation;Patient/family education;Balance training    OT Goals(Current goals can be found in the care plan section) Acute Rehab OT Goals Patient Stated Goal: Pt did not state OT Goal Formulation: With patient/family Time For Goal Achievement: 06/16/14 Potential to Achieve Goals: Fair ADL Goals Pt Will Perform Upper Body Bathing: with supervision;sitting Pt Will Perform Lower Body Bathing: with max assist;sit to/from stand;with adaptive equipment Pt Will Transfer to Toilet: with mod assist;stand pivot transfer;bedside commode;grab bars Additional ADL Goal #1: Pt will adhere to THA and WBing precautions during BADLs and functional mobility with min verbal cues  OT Frequency: Min 2X/week   Barriers to D/C: Decreased caregiver support          Co-evaluation PT/OT/SLP Co-Evaluation/Treatment: Yes Reason for Co-Treatment: For patient/therapist safety;Complexity of the patient's impairments (multi-system involvement)   OT goals addressed during session: Strengthening/ROM      End of Session Equipment Utilized During Treatment: Oxygen;Other (comment) (sliding board and w/c ) Nurse Communication: Mobility status;Patient requests pain meds;Precautions  Activity Tolerance: Patient limited by pain;Patient limited by fatigue Patient left: in bed;with call bell/phone within reach;with bed alarm set;with nursing/sitter in room;with family/visitor present   Time: 3235-5732 OT Time Calculation (min): 32 min Charges:  OT General Charges $OT Visit: 1 Procedure OT Treatments $Therapeutic Activity: 8-22 mins G-Codes:    Jora Galluzzo M 2014/06/23, 11:51 AM

## 2014-06-03 ENCOUNTER — Inpatient Hospital Stay (HOSPITAL_COMMUNITY): Payer: Medicare Other

## 2014-06-03 DIAGNOSIS — S72002A Fracture of unspecified part of neck of left femur, initial encounter for closed fracture: Secondary | ICD-10-CM | POA: Insufficient documentation

## 2014-06-03 LAB — CBC
HCT: 31.6 % — ABNORMAL LOW (ref 39.0–52.0)
HEMOGLOBIN: 10.3 g/dL — AB (ref 13.0–17.0)
MCH: 28.9 pg (ref 26.0–34.0)
MCHC: 32.6 g/dL (ref 30.0–36.0)
MCV: 88.8 fL (ref 78.0–100.0)
Platelets: 161 10*3/uL (ref 150–400)
RBC: 3.56 MIL/uL — ABNORMAL LOW (ref 4.22–5.81)
RDW: 14.3 % (ref 11.5–15.5)
WBC: 9.3 10*3/uL (ref 4.0–10.5)

## 2014-06-03 LAB — GLUCOSE, CAPILLARY
GLUCOSE-CAPILLARY: 174 mg/dL — AB (ref 70–99)
GLUCOSE-CAPILLARY: 152 mg/dL — AB (ref 70–99)
GLUCOSE-CAPILLARY: 169 mg/dL — AB (ref 70–99)
Glucose-Capillary: 195 mg/dL — ABNORMAL HIGH (ref 70–99)
Glucose-Capillary: 156 mg/dL — ABNORMAL HIGH (ref 70–99)

## 2014-06-03 LAB — BASIC METABOLIC PANEL
Anion gap: 15 (ref 5–15)
BUN: 30 mg/dL — AB (ref 6–23)
CO2: 22 mEq/L (ref 19–32)
CREATININE: 1 mg/dL (ref 0.50–1.35)
Calcium: 9.1 mg/dL (ref 8.4–10.5)
Chloride: 102 mEq/L (ref 96–112)
GFR calc Af Amer: 77 mL/min — ABNORMAL LOW (ref >90)
GFR calc non Af Amer: 66 mL/min — ABNORMAL LOW (ref >90)
GLUCOSE: 196 mg/dL — AB (ref 70–99)
Potassium: 4.5 mEq/L (ref 3.7–5.3)
Sodium: 139 mEq/L (ref 137–147)

## 2014-06-03 MED ORDER — NICOTINE 14 MG/24HR TD PT24
14.0000 mg | MEDICATED_PATCH | Freq: Every day | TRANSDERMAL | Status: DC
  Administered 2014-06-03 – 2014-06-06 (×4): 14 mg via TRANSDERMAL
  Filled 2014-06-03 (×4): qty 1

## 2014-06-03 MED ORDER — HYDROCODONE-ACETAMINOPHEN 5-325 MG PO TABS
1.0000 | ORAL_TABLET | Freq: Four times a day (QID) | ORAL | Status: DC | PRN
  Administered 2014-06-03 – 2014-06-06 (×6): 1 via ORAL
  Filled 2014-06-03 (×6): qty 1

## 2014-06-03 NOTE — Progress Notes (Signed)
Admission note:  Arrival Method: bed Mental Orientation: alert & oriented x 4  Telemetry: not ordered  Assessment: completed Skin: dressing to left hip area from recent surgery IV: left FA saline locked Pain: pt groaning d/t pain; repositioned pt and states he is more comfortable Tubes: N/A Safety Measures: discussed the Fall Prevention worksheet. Left at bedside  Admission: Completed and admission orders have been written  6E Orientation: Patient has been oriented to the unit, staff and to the room.  Family: At the bedside; daughter

## 2014-06-03 NOTE — Plan of Care (Signed)
Problem: Phase II Progression Outcomes Goal: Discharge plan established Outcome: Completed/Met Date Met:  06/03/14     

## 2014-06-03 NOTE — Clinical Social Work Placement (Addendum)
    Clinical Social Work Department CLINICAL SOCIAL WORK PLACEMENT NOTE 06/03/2014  Patient:  Kerner,Adarrius  Account Number:  [id number] Admit date:  05/30/2014  Clinical Social Worker:  Berton Mount, Latanya Presser  Date/time:  06/03/2014 03:23 PM  Clinical Social Work is seeking post-discharge placement for this patient at the following level of care:   SKILLED NURSING   (*CSW will update this form in Epic as items are completed)   06/03/2014  Patient/family provided with [facility] Department of Clinical Social Work's list of facilities offering this level of care within the geographic area requested by the patient (or if unable, by the patient's family).  06/03/2014  Patient/family informed of their freedom to choose among providers that offer the needed level of care, that participate in Medicare, Medicaid or managed care program needed by the patient, have an available bed and are willing to accept the patient.  06/03/2014  Patient/family informed of [facility]' ownership interest in [facility], as well as of the fact that they are under no obligation to receive care at this facility.  PASARR submitted to EDS on  PASARR number received on   FL2 transmitted to all facilities in geographic area requested by pt/family on  06/03/2014 FL2 transmitted to all facilities within larger geographic area on   Patient informed that his/her managed care company has contracts with or will negotiate with  certain facilities, including the following:     Patient/family informed of bed offers received:  06/05/2014 Patient chooses bed at [facility] Physician recommends and patient chooses bed at    Patient to be transferred to [facility] on  06/05/2014 Patient to be transferred to facility by [facility] Patient and family notified of transfer on 06/05/2014 Name of family member notified:  Jana Half (daughter), son-in-law, and grandson  The following physician request were entered in  Epic: Physician Request  Please sign FL2.    Additional Comments:

## 2014-06-03 NOTE — Progress Notes (Signed)
Physical Therapy Treatment Patient Details Name: [name] [name] [place] MRN: [id number] DOB: 24-Jun-1927 Today's Date: 06/03/2014    History of Present Illness [name] [name] is a 80 y.o. male with history of atrial fibrillation status post ablation, diabetes mellitus, hypertension and hyperlipidemia had a fall at his house.Patient did not hit his head or lose consciousness and patient was brought to the ER. In the ER workup revealed left hip fracture and also right acetabular fracture.  Pt underwent  Bipolar arthroplasty of the Lt. hip    PT Comments    Pt making very slow progress. Pt needs to be able to sit with better stability before he can assist with sliding board transfers. Recommend using maximove lift for bed to chair transfers at this time.  Follow Up Recommendations  SNF     Equipment Recommendations  None recommended by PT    Recommendations for Other Services       Precautions / Restrictions Precautions Precautions: Fall;Posterior Hip Restrictions RLE Weight Bearing: Non weight bearing LLE Weight Bearing: Weight bearing as tolerated    Mobility  Bed Mobility Overal bed mobility: Needs Assistance Bed Mobility: Supine to Sit     Supine to sit: +2 for physical assistance;Total assist     General bed mobility comments: requires assist for all aspects  Transfers Overall transfer level: Needs assistance               General transfer comment: Used maximove to transfer from EOB to chair.  Ambulation/Gait                 Stairs            Wheelchair Mobility    Modified Rankin (Stroke Patients Only)       Balance Overall balance assessment: Needs assistance Sitting-balance support: Bilateral upper extremity supported;Feet supported Sitting balance-Leahy Scale: Poor Sitting balance - Comments: Pt sat EOB x 15 minutes with assist varying between supervision and min A. Postural control: Posterior lean;Right lateral lean                           Cognition Arousal/Alertness: Awake/alert Behavior During Therapy: WFL for tasks assessed/performed Overall Cognitive Status: Impaired/Different from baseline Area of Impairment: Orientation;Attention;Memory;Following commands;Safety/judgement;Awareness;Problem solving Orientation Level: Disoriented to;Time;Situation Current Attention Level: Sustained Memory: Decreased recall of precautions;Decreased short-term memory Following Commands: Follows one step commands consistently Safety/Judgement: Decreased awareness of safety;Decreased awareness of deficits   Problem Solving: Slow processing;Decreased initiation;Requires verbal cues;Requires tactile cues      Exercises Total Joint Exercises Long Arc Quad: AAROM;Both;10 reps;Seated    General Comments        Pertinent Vitals/Pain Pain Assessment: Faces Faces Pain Scale: Hurts even more Pain Location: lt hip and ribs Pain Descriptors / Indicators: Grimacing;Guarding Pain Intervention(s): Limited activity within patient's tolerance;Monitored during session;Repositioned    Home Living                      Prior Function            PT Goals (current goals can now be found in the care plan section) Progress towards PT goals: Progressing toward goals    Frequency  Min 3X/week    PT Plan Current plan remains appropriate    Co-evaluation             End of Session Equipment Utilized During Treatment: Oxygen (maximove) Activity Tolerance: Patient limited by fatigue;Patient limited by pain Patient left: in chair;with  call bell/phone within reach;with family/visitor present     Time: 5170-0174 PT Time Calculation (min): 30 min  Charges:  $Therapeutic Activity: 23-37 mins                    G Codes:      Lavine Hargrove 06/27/14, 10:27 AM  Suanne Marker PT [phone]

## 2014-06-03 NOTE — Progress Notes (Signed)
[facility] (OTS)  Subjective: Difficulty with breathing, right chest; in route to get chest films. Daughter at bedside.  Objective:  Physical Exam: no pain with gentle rotation of the right hip or with flexion to 35 degrees  Assessment/Plan: R acetab frx with plans for bed to chair mobilization. Continued weakness from reduced activity is a major concern.  Have discussed at length with the daughter who comprehends the issues at hand.  [place], MD [facility] [phone] (p)

## 2014-06-03 NOTE — Progress Notes (Signed)
[facility] PROGRESS NOTE  [name] [name] AJO:[id number] DOB: 1927-07-22 DOA: 05/30/2014 PCP: Mathews Argyle, MD  Assessment/Plan:   Left hip fracture status post mechanical fall       -has high cardiac and Pulm risk       -appreciate Pulm and Cards input       -Ortho following, tolerated surgery better than expected       -DVT proph: lovenox       -transfer to floor   Abnormal troponin-trending down likely secondary to demand ischemia, cardiology consulted, 2-D echo completed, no wall motion abnormality noted, continue ASA/statin, remains on sotalol        Severe COPD/Chronic hypoxemia with interstitial lung disease        -Pulm consult appreciated,        -stable, continue nebs PRN   Diabetes mellitus type 2        -stable, continue SSI        UTI       -DC continue ceftriaxone, Urine Cx negative   History of atrial fibrillation status post ablation presently in sinus rhythm - continue sotalol.   Hypertension - continue present medications.   Hyperlipidemia - continue present medications.   Mild anemia - follow CBC and if there is no significant fall in hemoglobin further workup as outpatient       Confusion/Encephalopathy      -due to meds, UTI, COPD etc      -caution with narcotics, monitor       DVT proph: lovenox  Code Status: full Family Communication: family at bedside Disposition Plan:  As above    Brief narrative: [name] [name] is a 80 y.o. male with history of atrial fibrillation status post ablation, diabetes mellitus, hypertension and hyperlipidemia had a fall at his house when he was trying to cross the door. Patient did not hit his head or lose consciousness and patient was brought to the ER. In the ER workup revealed left hip fracture and also right acetabular fracture. Dr. Mardelle Matte was consulted from orthopedics. Patient was found to be hypoxic requiring 100% nonrebreather. Patient otherwise denies any chest pain or shortness of breath or  any productive cough fever chills nausea vomiting abdominal pain. Patient has known history of interstitial lung disease and since patient was short of breath CT chest was done which was not showing anything acute except for the known interstitial lung disease. Patient in addition also has right shoulder pain since the fall.  Consultants:  Cardiology  Pulmonary  Orthopedics  Procedures:  none  Antibiotics:  none  HPI/Subjective: Some cough, chest discomfort  Objective: Filed Vitals:   06/03/14 1200 06/03/14 1217 06/03/14 1438 06/03/14 1653  BP:  125/49 120/56 100/71  Pulse:  65 67 63  Temp:  98 F (36.7 C) 98.7 F (37.1 C)   TempSrc:  Oral Oral   Resp: 6 23 21 20   Height:      Weight:      SpO2: 94% 97% 99% 100%    Intake/Output Summary (Last 24 hours) at 06/03/14 1751 Last data filed at 06/03/14 1300  Gross per 24 hour  Intake    250 ml  Output    650 ml  Net   -400 ml    Exam:   General: AAO to self and place  Eyes: anicteric no pallor.  ENT: no discharge from ears eyes nose and mouth.  Neck: no mass felt.  Cardiovascular: S1-S2/RRR  Respiratory: no rhonchi or crepitations.  Abdomen: soft  nontender bowel sounds present no guarding or rigidity.  Skin: no rash.  Musculoskeletal: pain on moving lower extremities. Patient also has pain in my right shoulder.  Psychiatric: appears normal.        Data Reviewed: Basic Metabolic Panel:  Recent Labs Lab 05/31/14 0315 05/31/14 2013 06/01/14 0259 06/02/14 0319 06/03/14 0200  NA 137 139 139 141 139  K 5.1 5.1 4.9 4.9 4.5  CL 100 103 103 106 102  CO2 27 24 22 21 22   GLUCOSE 221* 186* 196* 181* 196*  BUN 19 25* 26* 30* 30*  CREATININE 1.08 1.30 1.19 1.10 1.00  CALCIUM 8.8 8.7 8.8 8.9 9.1    Liver Function Tests:  Recent Labs Lab 05/31/14 0315 06/01/14 0259  AST 17 24  ALT 12 12  ALKPHOS 94 84  BILITOT 0.4 0.5  PROT 6.9 6.7  ALBUMIN 3.3* 3.1*   No results for input(s):  LIPASE, AMYLASE in the last 168 hours.  Recent Labs Lab 06/01/14 0304  AMMONIA 22    CBC:  Recent Labs Lab 05/30/14 1701 05/31/14 0315 05/31/14 2013 06/01/14 0259 06/02/14 0319 06/03/14 0200  WBC 9.6 9.9 9.0 8.8 7.7 9.3  NEUTROABS 7.9* 8.5*  --   --   --   --   HGB 12.4* 12.1* 11.6* 11.6* 10.1* 10.3*  HCT 37.9* 37.4* 35.5* 35.8* 31.5* 31.6*  MCV 89.4 89.5 90.1 89.5 89.2 88.8  PLT 156 151 125* 141* 140* 161    Cardiac Enzymes:  Recent Labs Lab 05/30/14 1701 05/31/14 0315 05/31/14 0615 05/31/14 1235 05/31/14 1649  TROPONINI <0.30 0.87* 0.75* 0.51* 0.52*   BNP (last 3 results)  Recent Labs  05/31/14 0315  PROBNP 7931.0*     CBG:  Recent Labs Lab 06/02/14 2307 06/03/14 0339 06/03/14 0746 06/03/14 1134 06/03/14 1652  GLUCAP 176* 195* 156* 174* 152*    Recent Results (from the past 240 hour(s))  Surgical pcr screen     Status: Abnormal   Collection Time: 05/31/14  1:33 AM  Result Value Ref Range Status   MRSA, PCR NEGATIVE NEGATIVE Final   Staphylococcus aureus POSITIVE (A) NEGATIVE Final    Comment:        The Xpert SA Assay (FDA approved for NASAL specimens in patients over 40 years of age), is one component of a comprehensive surveillance program.  Test performance has been validated by [employer] for patients greater than or equal to 45 year old. It is not intended to diagnose infection nor to guide or monitor treatment. RESULT CALLED TO, READ BACK BY AND VERIFIED WITH: A WEATHERFORD,RN 05/31/14 0400 BY RHOLMES   Urine culture     Status: None   Collection Time: 05/31/14  2:06 AM  Result Value Ref Range Status   Specimen Description URINE, CATHETERIZED  Final   Special Requests NONE  Final   Culture  Setup Time   Final    05/31/2014 10:06 Performed at [place] Performed at [facility]   Final   Culture NO GROWTH Performed at [facility]   Final   Report Status  06/01/2014 FINAL  Final  Culture, Urine     Status: None   Collection Time: 05/31/14  9:21 PM  Result Value Ref Range Status   Specimen Description URINE, RANDOM  Final   Special Requests ADDED [id number]  Final   Culture  Setup Time   Final    06/01/2014 10:17 Performed at [facility]    Colony Count NO GROWTH Performed at [facility]   Final   Culture NO GROWTH Performed at [facility]   Final   Report Status 06/02/2014 FINAL  Final     Studies: Dg Chest 2 View  06/03/2014   CLINICAL DATA:  Chest pain and difficulty breathing  EXAM: CHEST  2 VIEW  COMPARISON:  May 31, 2014 and August 26, 2013  FINDINGS: There is chronic interstitial fibrosis in both mid and lower lung zones. There is no frank edema or consolidation. Heart is enlarged with pulmonary vascularity within normal limits. No adenopathy. There is atherosclerotic change in aorta. No adenopathy. No appreciable bone lesions.  IMPRESSION: Chronic interstitial fibrotic type change. No frank edema or consolidation. Stable cardiomegaly. No adenopathy.   Electronically Signed   By: Lowella Grip M.D.   On: 06/03/2014 12:03   Dg Thoracic Spine 2 View  05/30/2014   CLINICAL DATA:  Fall, mid back pain  EXAM: THORACIC SPINE - 2 VIEW  COMPARISON:  None.  FINDINGS: Normal thoracic kyphosis.  No evidence of fracture dislocation. Vertebral body heights are maintained.  Moderate multilevel degenerative changes.  Vascular calcifications.  Visualized lungs are clear.  IMPRESSION: No fracture or dislocation is seen.  Moderate multilevel degenerative changes.   Electronically Signed   By: Julian Hy M.D.   On: 05/30/2014 19:23   Dg Lumbar Spine Complete  05/30/2014   CLINICAL DATA:  Fall, mid/lower back pain  EXAM: LUMBAR SPINE - COMPLETE 4+ VIEW  COMPARISON:  CT abdomen dated 08/27/2013  FINDINGS: Five lumbar type vertebral bodies.  Normal lumbar lordosis.  No evidence of fracture or dislocation.  Vertebral body heights are maintained.  Grade 1 anterolisthesis of L4 on L5.  Moderate multilevel degenerative changes.  Visualized bony pelvis appears intact.  Vascular calcifications with known infrarenal abdominal aortic aneurysm.a  IMPRESSION: No fracture or dislocation is seen.  Moderate degenerative changes.  Grade 1 anterolisthesis of L4 on L5.   Electronically Signed   By: Julian Hy M.D.   On: 05/30/2014 20:01   Dg Shoulder Right  05/30/2014   CLINICAL DATA:  Fall, right shoulder pain  EXAM: RIGHT SHOULDER - 2+ VIEW  COMPARISON:  None.  FINDINGS: No fracture or dislocation is seen.  Moderate degenerative changes of the glenohumeral joint and acromioclavicular joint  The visualized soft tissues are unremarkable.  Visualized right lung is clear.  IMPRESSION: No fracture or dislocation is seen.  Moderate degenerative changes.   Electronically Signed   By: Julian Hy M.D.   On: 05/30/2014 19:22   Dg Hip Complete Left  05/30/2014   CLINICAL DATA:  Left hip pain after falling at home today at 1500 hr.  EXAM: LEFT HIP - COMPLETE 2+ VIEW  COMPARISON:  Portable pelvis obtained earlier today.  FINDINGS: Left femoral neck fracture with mild valgus angulation. Atheromatous arterial calcifications.  IMPRESSION: Previously noted mildly displaced left femoral neck fracture with mild valgus angulation.   Electronically Signed   By: Enrique Sack M.D.   On: 05/30/2014 19:21   Ct Head Wo Contrast  05/30/2014   CLINICAL DATA:  Right neck pain after falling today and hitting his shoulder against a door jamb.  EXAM: CT HEAD WITHOUT CONTRAST  CT CERVICAL SPINE WITHOUT CONTRAST  TECHNIQUE: Multidetector CT imaging of the head and cervical spine was performed following the standard protocol without intravenous contrast. Multiplanar CT image reconstructions of the cervical spine were also generated.  COMPARISON:  10/01/2013.  FINDINGS: CT HEAD FINDINGS  Diffusely enlarged ventricles and subarachnoid spaces.  Patchy white matter low density in both cerebral hemispheres. No skull fracture, intracranial hemorrhage or paranasal sinus air-fluid levels. Dense arterial calcifications at the skull base.  CT CERVICAL SPINE FINDINGS  Extensive multilevel cervical spine degenerative changes. No prevertebral soft tissue swelling, fractures or subluxations. Bilateral vertebral and carotid artery atheromatous calcifications. Bullous changes at both lung apices.  IMPRESSION: 1. No skull fracture or intracranial hemorrhage. 2. No cervical spine fracture or subluxation. 3. Stable mid atrophy and chronic small vessel white matter ischemic changes. 4. Stable marked cervical spine degenerative changes. 5. COPD.   Electronically Signed   By: Enrique Sack M.D.   On: 05/30/2014 19:07   Ct Chest W Contrast  05/30/2014   CLINICAL DATA:  [place] today. Complaining of right shoulder and left hip pain.  EXAM: CT CHEST, ABDOMEN, AND PELVIS WITH CONTRAST  TECHNIQUE: Multidetector CT imaging of the chest, abdomen and pelvis was performed following the standard protocol during bolus administration of intravenous contrast.  CONTRAST:  122mL OMNIPAQUE IOHEXOL 300 MG/ML  SOLN  COMPARISON:  Chest CT, 08/30/2013.  FINDINGS: CT CHEST FINDINGS  Heart is mildly enlarged. There are dense coronary artery and mitral valve annular calcifications. Aorta is normal in caliber. There is partly calcified mild plaque along the thoracic aorta and at the origin of the left subclavian artery. No significant stenosis.  Right left pulmonary arteries are dilated to 3 cm, stable.  Borderline enlarged right peritracheal lymph node measures 1 cm short axis, stable. No mediastinal masses. No hilar masses or adenopathy.  Lungs show heterogeneous coarse peripheral reticular opacities consistent with interstitial fibrosis, with a pattern suggesting UIP. Focal opacity noted in the left lower lobe on the prior exam measuring 15 mm x 7 mm, is without significant change. No acute lung  consolidation or evidence of edema. No pleural effusion or pneumothorax.  CT ABDOMEN AND PELVIS FINDINGS  Liver shows morphologic changes consistent with cirrhosis. No liver mass or focal lesion. No contusion or laceration.  Mean is prominent measuring 12.4 cm in greatest dimension. No splenic mass or focal lesion.  Gallbladder is unremarkable. No bile duct dilation. No pancreatic masses or inflammation. No adrenal masses.  Mild bilateral renal cortical thinning. There is a peripherally calcified hypo attenuating, 3.1 cm mass protruding from the inferior margin of the left kidney, not imaged on the prior chest CT. This is likely a mildly complicated cyst. There is a low attenuation lesion in the upper pole left kidney, most likely a simple cyst. No other renal masses or lesions. No hydronephrosis. Normal ureters. Bladder is unremarkable.  There is an infrarenal abdominal aortic aneurysm measuring 3.7 cm in greatest anterior-posterior dimension. No evidence of rupture. Atherosclerotic changes noted along the abdominal aorta and its branch vessels.  No pathologically enlarged lymph nodes. No abnormal fluid collections. Specifically no evidence of hemo peritoneum.  No bowel wall thickening to suggest a bowel hematoma. No bowel inflammatory changes. No mesenteric inflammation or contusion/hematoma.  MUSCULOSKELETAL:  There fractures of the right acetabulum. Fracture extends across the posterior column, extending across the medial acetabulum to the anterior,. Fracture is mildly distracted by 7 mm. There is mild fracture comminution with small fracture fragments noted between the major fracture components, at least 1 within the hip joint.  There is a fracture of the left femoral neck. This is subcapital, oblique and non comminuted. Fracture is displaced with the distal fracture component migrating superiorly by 17  mm. There is also mild varus angulation as well as significant apex anterior angulation.  No other pelvic or  proximal femur fractures.  There are no other fractures.  IMPRESSION: 1. Fractures of the right acetabulum involving the anterior posterior columns. 2. Fracture of the left femoral neck, mildly displaced and angulated. 3. No other fractures. 4. No other acute findings in the chest, abdomen or pelvis. No other evidence of acute injury. 5. Chronic findings include cardiomegaly, coronary artery calcifications and changes consistent with interstitial fibrosis. Pulmonary artery dilation suggest pulmonary artery hypertension. Below the diaphragm, there changes of cirrhosis with borderline splenomegaly. Probable left renal cysts. 6. 3.7 cm infrarenal abdominal aortic aneurysm. No evidence of rupture.   Electronically Signed   By: Lajean Manes M.D.   On: 05/30/2014 22:06   Ct Cervical Spine Wo Contrast  05/30/2014   CLINICAL DATA:  Right neck pain after falling today and hitting his shoulder against a door jamb.  EXAM: CT HEAD WITHOUT CONTRAST  CT CERVICAL SPINE WITHOUT CONTRAST  TECHNIQUE: Multidetector CT imaging of the head and cervical spine was performed following the standard protocol without intravenous contrast. Multiplanar CT image reconstructions of the cervical spine were also generated.  COMPARISON:  10/01/2013.  FINDINGS: CT HEAD FINDINGS  Diffusely enlarged ventricles and subarachnoid spaces. Patchy white matter low density in both cerebral hemispheres. No skull fracture, intracranial hemorrhage or paranasal sinus air-fluid levels. Dense arterial calcifications at the skull base.  CT CERVICAL SPINE FINDINGS  Extensive multilevel cervical spine degenerative changes. No prevertebral soft tissue swelling, fractures or subluxations. Bilateral vertebral and carotid artery atheromatous calcifications. Bullous changes at both lung apices.  IMPRESSION: 1. No skull fracture or intracranial hemorrhage. 2. No cervical spine fracture or subluxation. 3. Stable mid atrophy and chronic small vessel white matter ischemic  changes. 4. Stable marked cervical spine degenerative changes. 5. COPD.   Electronically Signed   By: Enrique Sack M.D.   On: 05/30/2014 19:07   Ct Abdomen Pelvis W Contrast  05/30/2014   CLINICAL DATA:  [place] today. Complaining of right shoulder and left hip pain.  EXAM: CT CHEST, ABDOMEN, AND PELVIS WITH CONTRAST  TECHNIQUE: Multidetector CT imaging of the chest, abdomen and pelvis was performed following the standard protocol during bolus administration of intravenous contrast.  CONTRAST:  149mL OMNIPAQUE IOHEXOL 300 MG/ML  SOLN  COMPARISON:  Chest CT, 08/30/2013.  FINDINGS: CT CHEST FINDINGS  Heart is mildly enlarged. There are dense coronary artery and mitral valve annular calcifications. Aorta is normal in caliber. There is partly calcified mild plaque along the thoracic aorta and at the origin of the left subclavian artery. No significant stenosis.  Right left pulmonary arteries are dilated to 3 cm, stable.  Borderline enlarged right peritracheal lymph node measures 1 cm short axis, stable. No mediastinal masses. No hilar masses or adenopathy.  Lungs show heterogeneous coarse peripheral reticular opacities consistent with interstitial fibrosis, with a pattern suggesting UIP. Focal opacity noted in the left lower lobe on the prior exam measuring 15 mm x 7 mm, is without significant change. No acute lung consolidation or evidence of edema. No pleural effusion or pneumothorax.  CT ABDOMEN AND PELVIS FINDINGS  Liver shows morphologic changes consistent with cirrhosis. No liver mass or focal lesion. No contusion or laceration.  Mean is prominent measuring 12.4 cm in greatest dimension. No splenic mass or focal lesion.  Gallbladder is unremarkable. No bile duct dilation. No pancreatic masses or inflammation. No adrenal masses.  Mild bilateral renal cortical  thinning. There is a peripherally calcified hypo attenuating, 3.1 cm mass protruding from the inferior margin of the left kidney, not imaged on the prior chest  CT. This is likely a mildly complicated cyst. There is a low attenuation lesion in the upper pole left kidney, most likely a simple cyst. No other renal masses or lesions. No hydronephrosis. Normal ureters. Bladder is unremarkable.  There is an infrarenal abdominal aortic aneurysm measuring 3.7 cm in greatest anterior-posterior dimension. No evidence of rupture. Atherosclerotic changes noted along the abdominal aorta and its branch vessels.  No pathologically enlarged lymph nodes. No abnormal fluid collections. Specifically no evidence of hemo peritoneum.  No bowel wall thickening to suggest a bowel hematoma. No bowel inflammatory changes. No mesenteric inflammation or contusion/hematoma.  MUSCULOSKELETAL:  There fractures of the right acetabulum. Fracture extends across the posterior column, extending across the medial acetabulum to the anterior,. Fracture is mildly distracted by 7 mm. There is mild fracture comminution with small fracture fragments noted between the major fracture components, at least 1 within the hip joint.  There is a fracture of the left femoral neck. This is subcapital, oblique and non comminuted. Fracture is displaced with the distal fracture component migrating superiorly by 17 mm. There is also mild varus angulation as well as significant apex anterior angulation.  No other pelvic or proximal femur fractures.  There are no other fractures.  IMPRESSION: 1. Fractures of the right acetabulum involving the anterior posterior columns. 2. Fracture of the left femoral neck, mildly displaced and angulated. 3. No other fractures. 4. No other acute findings in the chest, abdomen or pelvis. No other evidence of acute injury. 5. Chronic findings include cardiomegaly, coronary artery calcifications and changes consistent with interstitial fibrosis. Pulmonary artery dilation suggest pulmonary artery hypertension. Below the diaphragm, there changes of cirrhosis with borderline splenomegaly. Probable left  renal cysts. 6. 3.7 cm infrarenal abdominal aortic aneurysm. No evidence of rupture.   Electronically Signed   By: Lajean Manes M.D.   On: 05/30/2014 22:06   Pelvis Portable  06/01/2014   CLINICAL DATA:  Postop left total hip.  EXAM: PORTABLE PELVIS 1-2 VIEWS  COMPARISON:  05/31/2014  FINDINGS: Patient has undergone total hip arthroplasty. Left hip prosthesis appears well seated. No evidence for dislocation on this frontal view. There is asymmetry of the SI joint, likely related to pelvic fractures. Right acetabular fracture again noted. There is deformity of the right inferior pubic ramus.  IMPRESSION: 1. Status post left total hip arthroplasty. 2. No evidence for dislocation on this frontal view.   Electronically Signed   By: Shon Hale M.D.   On: 06/01/2014 17:07   Dg Pelvis Portable  05/30/2014   CLINICAL DATA:  [place] today at 3 o'clock. Tripped over a door way. Left hip pain.  EXAM: PORTABLE PELVIS 1-2 VIEWS PORTABLE ONE-VIEW CHEST X-RAY.  COMPARISON:  HIP RADIOGRAPHS 10/01/2013 AND CHEST X-RAY 08/26/2013.  FINDINGS: CHEST X-RAY:  The heart is mildly enlarged but stable. There is tortuosity and calcification of the thoracic aorta. Chronic lung changes with pulmonary fibrosis. No definite acute overlying pulmonary process. The bony thorax is grossly intact.  Pelvis:  There is a displaced left femoral neck fracture. The right hip is intact. Moderate degenerative changes bilaterally, right greater than left. The pubic symphysis and SI joints are intact. No pelvic fractures. Extensive vascular calcifications are noted.  IMPRESSION: 1. Displaced left femoral neck fracture. 2. No acute cardiopulmonary findings. Chronic lung changes/pulmonary fibrosis.   Electronically Signed  By: Kalman Jewels M.D.   On: 05/30/2014 17:36   Dg Pelvis Comp Min 3v  05/31/2014   CLINICAL DATA:  Right acetabular fracture  EXAM: JUDET PELVIS - 3+ VIEW  COMPARISON:  05/30/2014  FINDINGS: Again noted changes consistent with a  left femoral neck fracture with impaction. The right acetabular fracture is again noted and unchanged. No right femoral abnormality seen.  IMPRESSION: Right acetabular fracture stable from the prior exam.  Left femoral neck fracture stable from the prior exam.   Electronically Signed   By: Inez Catalina M.D.   On: 05/31/2014 11:34   Ct 3d Recon At Scanner  05/31/2014   CLINICAL DATA:  Nonspecific (abnormal) findings on radiological and other examination of musculoskeletal system. Right acetabular fracture. Left femoral neck fracture.  EXAM: 3-DIMENSIONAL CT IMAGE RENDERING ON ACQUISITION WORKSTATION  TECHNIQUE: 3-dimensional CT images were rendered by post-processing of the original CT data on an acquisition workstation.  COMPARISON:  CT scan and radiographs dated 05/30/2014  FINDINGS: There is a oblique vertical fracture through the medial aspect of the right acetabulum the with approximately 8 mm of distraction. There is also a subtle old healed fracture of the right inferior pubic ramus. I do not think these are acute.  There is an acute subcapital fracture of the left femoral neck with impaction and angulation. There are slight arthritic changes of the superior lateral aspect of the left acetabulum.  The 3D images better demonstrate the right acetabular fracture.  IMPRESSION: Slightly distracted subacute fracture through the medial aspect of the right acetabulum. Old healed fracture of the low right inferior pubic ramus.   Electronically Signed   By: Rozetta Nunnery M.D.   On: 05/31/2014 11:37   Dg Chest Port 1 View  05/31/2014   CLINICAL DATA:  80 year old male with low oxygen level. Subsequent encounter.  EXAM: PORTABLE CHEST - 1 VIEW  COMPARISON:  05/30/2014 and 08/26/2013.  FINDINGS: Pulmonary vascular congestion superimposed upon chronic lung changes.  Elevated left hemidiaphragm with left base subsegmental atelectasis.  No gross pneumothorax.  Cardiomegaly.  Calcified mildly tortuous aorta.  IMPRESSION:  Pulmonary vascular congestion superimposed upon chronic lung changes.  Elevated left hemidiaphragm with left base subsegmental atelectasis.  No gross pneumothorax.  Cardiomegaly.  Calcified mildly tortuous aorta.   Electronically Signed   By: Chauncey Cruel M.D.   On: 05/31/2014 06:55   Dg Chest Port 1 View  05/30/2014   CLINICAL DATA:  80 year old male who fell at 1500 hr today tripping over door way. Acute pain. Initial encounter.  EXAM: PORTABLE CHEST - 1 VIEW  COMPARISON:  Chest CT 08/30/2013 and earlier.  FINDINGS: Portable AP semi upright view at 1716 hr. Lower lung volumes. Stable cardiomegaly and mediastinal contours. Peripheral increased opacity greater in the left lung is stable. No pneumothorax, pulmonary edema, or pleural effusion identified. No acute pulmonary opacity identified. Calcified atherosclerosis of the aorta. Visualized tracheal air column is within normal limits. No acute osseous abnormality identified.  IMPRESSION: Cardiomegaly and chronic lung disease. No superimposed acute findings are identified.   Electronically Signed   By: Lars Pinks M.D.   On: 05/30/2014 17:35    Scheduled Meds: . aspirin EC  81 mg Oral Daily  . calcium carbonate  2 tablet Oral Q breakfast  . cholecalciferol  1,000 Units Oral Daily  . docusate sodium  100 mg Oral BID  . enoxaparin (LOVENOX) injection  40 mg Subcutaneous Q24H  . finasteride  5 mg Oral Daily  .  insulin aspart  0-9 Units Subcutaneous TID WC  . nicotine  14 mg Transdermal Daily  . omega-3 acid ethyl esters  1,000 mg Oral BID  . rosuvastatin  10 mg Oral Daily  . senna  1 tablet Oral BID  . sotalol  80 mg Oral BID   Continuous Infusions:    Principal Problem:   Hip fracture, left Active Problems:   Interstitial lung disease   Hyperlipidemia   History of atrial fibrillation   Diabetes mellitus, controlled   Hip fracture   COPD exacerbation   Elevated troponin   Personal history of other diseases of circulatory system    Pulmonary fibrosis    Time spent: 30 minutes   [facility]  [facility] Pager [phone]. If 7PM-7AM, please contact night-coverage at [email or web address], password [facility] 06/03/2014, 5:51 PM  LOS: 4 days

## 2014-06-03 NOTE — Progress Notes (Signed)
     Subjective:  Patient reports pain as mild.  Interacting with me, slightly lethargic, but better than 2 days ago.  Still has foley.  Objective:   VITALS:   Filed Vitals:   06/03/14 0100 06/03/14 0200 06/03/14 0300 06/03/14 0340  BP:    126/60  Pulse: 71 73 68 70  Temp:    98.1 F (36.7 C)  TempSrc:    Oral  Resp: 35 29 41 31  Height:      Weight:      SpO2: 92% 96% 95% 93%    Neurologically intact Dorsiflexion/Plantar flexion intact Incision: dressing C/D/I and no drainage   Lab Results  Component Value Date   WBC 9.3 06/03/2014   HGB 10.3* 06/03/2014   HCT 31.6* 06/03/2014   MCV 88.8 06/03/2014   PLT 161 06/03/2014   BMET    Component Value Date/Time   NA 139 06/03/2014 0200   K 4.5 06/03/2014 0200   CL 102 06/03/2014 0200   CO2 22 06/03/2014 0200   GLUCOSE 196* 06/03/2014 0200   BUN 30* 06/03/2014 0200   CREATININE 1.00 06/03/2014 0200   CALCIUM 9.1 06/03/2014 0200   GFRNONAA 66* 06/03/2014 0200   GFRAA 77* 06/03/2014 0200     Assessment/Plan: 2 Days Post-Op   Principal Problem:   Hip fracture, left Active Problems:   Interstitial lung disease   Hyperlipidemia   History of atrial fibrillation   Diabetes mellitus, controlled   Hip fracture   COPD exacerbation   Elevated troponin   Personal history of other diseases of circulatory system   Pulmonary fibrosis   Advance diet May go to ortho floor if ok with primary team.  NWB RLE, WBAT LLE.  Ok for anticoagulation.  Should be on lovenox already.   DC foley per protocol.   SNF planning, per primary team. CCM signed off.     Akshaya Toepfer P 06/03/2014, 7:47 AM   Marchia Bond, MD Cell [phone]

## 2014-06-03 NOTE — Clinical Social Work Psychosocial (Signed)
     Clinical Social Work Department BRIEF PSYCHOSOCIAL ASSESSMENT 06/03/2014  Patient:  [name] [name],[name] [name]     Account Number:  [id number]     Admit date:  05/30/2014  Clinical Social Worker:  Adair Laundry  Date/Time:  06/03/2014 03:16 PM  Referred by:  Physician  Date Referred:  06/03/2014 Referred for  SNF Placement   Other Referral:   Interview type:  Family Other interview type:   Spoke with pt daughter    PSYCHOSOCIAL DATA Living Status:  ALONE Admitted from facility:   Level of care:   Primary support name:  [name] [name] Primary support relationship to patient:  CHILD, ADULT Degree of support available:   Pt has strong support    CURRENT CONCERNS Current Concerns  Post-Acute Placement   Other Concerns:    SOCIAL WORK ASSESSMENT / PLAN CSW spoke with pt daughter about SNF. Pt daughter very anxious to speak with CSW and unfamiliar with SNF process. CSW explained process and explained that pt has not been overlooked. CSW explained referral process. Pt daughter agreeable to pt being faxed to all [place] SNFs but expressed a strong preference for [facility]. CSW did confirm that facility would be notified of preference. Pt daughter wanting to know next time CSW would contact her. CSW explained that weekend CSW or unit CSW on Monday would provide bed offers. CSW did explain that weekend CSW may be unable to speak with family but referral would be sent out today. Pt daughter thankful.   Assessment/plan status:  Psychosocial Support/Ongoing Assessment of Needs Other assessment/ plan:   Information/referral to community resources:   SNF list to be provided with bed offers    PATIENTS/FAMILYS RESPONSE TO PLAN OF CARE: Pt daughter wanting pt to dc to SNF for ST rehab.    [place], [place]

## 2014-06-03 NOTE — Progress Notes (Signed)
Transferred to [facility] room 12 by bed, stable, report given to RN, belongings with daughter.

## 2014-06-03 NOTE — Care Management Note (Signed)
    Page 1 of 1   06/03/2014     11:57:42 AM CARE MANAGEMENT NOTE 06/03/2014  Patient:  [name] [name]   Account Number:  [id number]  Date Initiated:  05/31/2014  Documentation initiated by:  [facility]  Subjective/Objective Assessment:   mechanical fall, fracture     Action/Plan:   dtrTami [name] [phone]   Anticipated DC Date:     Anticipated DC Plan:  SKILLED NURSING FACILITY  In-house referral  Clinical Social Worker      DC Planning Services  CM consult      Choice offered to / List presented to:             Status of service:  In process, will continue to follow Medicare Important Message given?  YES (If response is "NO", the following Medicare IM given date fields will be blank) Date Medicare IM given:  06/03/2014 Medicare IM given by:  [facility] Date Additional Medicare IM given:   Additional Medicare IM given by:    Discharge Disposition:    Per UR Regulation:    If discussed at Long Length of Stay Meetings, dates discussed:    Comments:  06-03-14 11:44am [name] [name], RNBSN [phone] Family in room, awaiting to talk with SW about placement. Left message for SW of family desire for meetings and texted SW contact numbers of daughter and husband.  Prior to admission patient lived in separate apt at daughter house.  She states independent.  05/31/2014 1730 NCM spoke to pt's dtr, [name] [name] and son-in-law [name] [name] [phone], [phone] respectively. They want SNF -rehab at dc. CSW referral made. [name] Finner RN CCM Case Mgmt phone [phone]

## 2014-06-03 NOTE — Plan of Care (Signed)
Problem: Phase II Progression Outcomes Goal: Bed to chair Outcome: Completed/Met Date Met:  06/03/14     

## 2014-06-04 DIAGNOSIS — S72002D Fracture of unspecified part of neck of left femur, subsequent encounter for closed fracture with routine healing: Secondary | ICD-10-CM

## 2014-06-04 LAB — BASIC METABOLIC PANEL
ANION GAP: 14 (ref 5–15)
BUN: 31 mg/dL — ABNORMAL HIGH (ref 6–23)
CHLORIDE: 100 meq/L (ref 96–112)
CO2: 24 mEq/L (ref 19–32)
Calcium: 8.8 mg/dL (ref 8.4–10.5)
Creatinine, Ser: 0.93 mg/dL (ref 0.50–1.35)
GFR calc Af Amer: 86 mL/min — ABNORMAL LOW (ref >90)
GFR calc non Af Amer: 74 mL/min — ABNORMAL LOW (ref >90)
GLUCOSE: 168 mg/dL — AB (ref 70–99)
POTASSIUM: 4.1 meq/L (ref 3.7–5.3)
SODIUM: 138 meq/L (ref 137–147)

## 2014-06-04 LAB — GLUCOSE, CAPILLARY
GLUCOSE-CAPILLARY: 196 mg/dL — AB (ref 70–99)
Glucose-Capillary: 155 mg/dL — ABNORMAL HIGH (ref 70–99)
Glucose-Capillary: 159 mg/dL — ABNORMAL HIGH (ref 70–99)
Glucose-Capillary: 172 mg/dL — ABNORMAL HIGH (ref 70–99)
Glucose-Capillary: 176 mg/dL — ABNORMAL HIGH (ref 70–99)
Glucose-Capillary: 188 mg/dL — ABNORMAL HIGH (ref 70–99)
Glucose-Capillary: 197 mg/dL — ABNORMAL HIGH (ref 70–99)

## 2014-06-04 LAB — CBC
HCT: 29.5 % — ABNORMAL LOW (ref 39.0–52.0)
HEMOGLOBIN: 9.6 g/dL — AB (ref 13.0–17.0)
MCH: 28.8 pg (ref 26.0–34.0)
MCHC: 32.5 g/dL (ref 30.0–36.0)
MCV: 88.6 fL (ref 78.0–100.0)
Platelets: 164 10*3/uL (ref 150–400)
RBC: 3.33 MIL/uL — AB (ref 4.22–5.81)
RDW: 14.3 % (ref 11.5–15.5)
WBC: 7.8 10*3/uL (ref 4.0–10.5)

## 2014-06-04 MED ORDER — POLYETHYLENE GLYCOL 3350 17 G PO PACK
17.0000 g | PACK | Freq: Two times a day (BID) | ORAL | Status: DC
  Administered 2014-06-04 – 2014-06-05 (×3): 17 g via ORAL
  Filled 2014-06-04 (×4): qty 1

## 2014-06-04 NOTE — Plan of Care (Signed)
Problem: Phase I Progression Outcomes Goal: Post op hemodynamically stable Outcome: Progressing Goal: Post op CMS/Neurovascular status WDL Outcome: Progressing Goal: Post op clear liquids, advance diet as tolerated Outcome: Completed/Met Date Met:  06/04/14

## 2014-06-04 NOTE — Progress Notes (Signed)
Primary cardiologist: Dr. Darlin Coco  Seen for followup: Elevated troponin, history of atrial fibrillation  Subjective:    Weak, limited appetite, complains of hip pain. No chest pain.  Objective:   Temp:  [97.7 F (36.5 C)-98.7 F (37.1 C)] 97.9 F (36.6 C) (11/07 0421) Pulse Rate:  [63-67] 66 (11/07 0421) Resp:  [6-23] 20 (11/07 0421) BP: (100-125)/(49-77) 119/77 mmHg (11/07 0421) SpO2:  [94 %-100 %] 96 % (11/07 0421) Last BM Date: 05/30/14  Filed Weights   05/30/14 1650  Weight: 182 lb (82.555 kg)    Intake/Output Summary (Last 24 hours) at 06/04/14 0833 Last data filed at 06/04/14 0700  Gross per 24 hour  Intake    120 ml  Output    350 ml  Net   -230 ml    Exam:  General: Frail appearing, no distress.  Lungs: Clear, nonlabored.  Cardiac: RRR, no gallop.  Extremities: Mild edema.   Lab Results:  Basic Metabolic Panel:  Recent Labs Lab 06/02/14 0319 06/03/14 0200 06/04/14 0450  NA 141 139 138  K 4.9 4.5 4.1  CL 106 102 100  CO2 21 22 24   GLUCOSE 181* 196* 168*  BUN 30* 30* 31*  CREATININE 1.10 1.00 0.93  CALCIUM 8.9 9.1 8.8    Liver Function Tests:  Recent Labs Lab 05/31/14 0315 06/01/14 0259  AST 17 24  ALT 12 12  ALKPHOS 94 84  BILITOT 0.4 0.5  PROT 6.9 6.7  ALBUMIN 3.3* 3.1*    CBC:  Recent Labs Lab 06/02/14 0319 06/03/14 0200 06/04/14 0450  WBC 7.7 9.3 7.8  HGB 10.1* 10.3* 9.6*  HCT 31.5* 31.6* 29.5*  MCV 89.2 88.8 88.6  PLT 140* 161 164    Cardiac Enzymes:  Recent Labs Lab 05/31/14 0615 05/31/14 1235 05/31/14 1649  TROPONINI 0.75* 0.51* 0.52*    Echocardiogram (05/31/2014): Study Conclusions  - Left ventricle: Hypokinesis of the basal inferoseptal, inferior and inferolateral walls. The cavity size was mildly dilated. Systolic function was normal. The estimated ejection fraction was in the range of 50% to 55%. Wall motion was normal; there were no regional wall motion abnormalities.  Doppler parameters are consistent with abnormal left ventricular relaxation (grade 1 diastolic dysfunction). There was no evidence of elevated ventricular filling pressure by Doppler parameters. - Ventricular septum: Septal motion showed paradox. - Aortic valve: Trileaflet; mildly thickened, mildly calcified leaflets. There was mild regurgitation. - Aortic root: The aortic root was mildly dilated. - Ascending aorta: The ascending aorta was normal in size. - Mitral valve: Calcified annulus. Mildly thickened, mildly calcified leaflets . There was mild regurgitation. - Left atrium: The atrium was mildly dilated. - Right ventricle: The cavity size was moderately dilated. Wall thickness was normal. Systolic function was moderately reduced. - Right atrium: The atrium was mildly dilated. - Tricuspid valve: There was trivial regurgitation. - Inferior vena cava: The vessel was dilated. The respirophasic diameter changes were blunted (< 50%), consistent with elevated central venous pressure. - Pericardium, extracardiac: There was no pericardial effusion.   Medications:   Scheduled Medications: . aspirin EC  81 mg Oral Daily  . calcium carbonate  2 tablet Oral Q breakfast  . cholecalciferol  1,000 Units Oral Daily  . docusate sodium  100 mg Oral BID  . enoxaparin (LOVENOX) injection  40 mg Subcutaneous Q24H  . finasteride  5 mg Oral Daily  . insulin aspart  0-9 Units Subcutaneous TID WC  . nicotine  14 mg Transdermal Daily  .  omega-3 acid ethyl esters  1,000 mg Oral BID  . rosuvastatin  10 mg Oral Daily  . senna  1 tablet Oral BID  . sotalol  80 mg Oral BID      PRN Medications:  acetaminophen **OR** acetaminophen, alum & mag hydroxide-simeth, antiseptic oral rinse, bisacodyl, feeding supplement (ENSURE COMPLETE), HYDROcodone-acetaminophen, menthol-cetylpyridinium **OR** phenol, ondansetron (ZOFRAN) IV, polyethylene glycol   Assessment:   1. Demand ischemia with  peak troponin I 0.75. No chest pain. LVEF normal by echocardiogram.  2. History of atrial fibrillation ablation. Has maintained sinus rhythm on Sotalol. Now on ASA.  3. Left femoral neck fracture after fall. POD# 4.  4. Severe COPD with recent hypoxic respiratory failure.  5. Hypertensin, blood pressure stable.   Plan/Discussion:    Continue ASA, Lovenox, Sotalol, Crestor. Not on diuretic at this time - keep I and O even.   Satira Sark, M.D., F.A.C.C.

## 2014-06-04 NOTE — Progress Notes (Signed)
[facility] PROGRESS NOTE  [name] [name] ZOX:[id number] DOB: [birth year] DOA: 05/30/2014 PCP: Mathews Argyle, MD  Assessment/Plan:   Left hip fracture status post mechanical fall       -Despite high cardiac and Pulm risk, tolerated surgery well       -appreciate Pulm and Cards input       -Ortho following       -DVT proph: lovenox       -DC foley, Physical therapy   Abnormal troponin-trending down likely secondary to demand ischemia, cardiology consulted, 2-D echo completed,  no wall motion abnormality noted, EF 50-55%, continue ASA/statin, remains on sotalol        Severe COPD/Chronic hypoxemia with interstitial lung disease        -Pulm consult appreciated,        -stable, continue nebs PRN   Diabetes mellitus type 2        -stable, continue SSI        UTI       -DC ceftriaxone, Urine Cx negative   History of atrial fibrillation status post ablation presently in sinus rhythm - continue sotalol.   Hypertension - continue present medications.   Hyperlipidemia - continue present medications.   Mild anemia - follow CBC and if there is no significant fall in hemoglobin further workup as outpatient        Constipation       -continue senokot BID and miralax        Confusion/Encephalopathy      -due to meds, UTI, COPD etc      -caution with narcotics, monitor       -improved      DVT proph: lovenox  Code Status: full Family Communication: family at bedside Disposition Plan:  As above    Brief narrative: [name] [name] is a 80 y.o. male with history of atrial fibrillation status post ablation, diabetes mellitus, hypertension and hyperlipidemia had a fall at his house when he was trying to cross the door. Patient did not hit his head or lose consciousness and patient was brought to the ER. In the ER workup revealed left hip fracture and also right acetabular fracture. Dr. Mardelle Matte was consulted from orthopedics. Patient was found to be hypoxic requiring 100%  nonrebreather. Patient otherwise denies any chest pain or shortness of breath or any productive cough fever chills nausea vomiting abdominal pain. Patient has known history of interstitial lung disease and since patient was short of breath CT chest was done which was not showing anything acute except for the known interstitial lung disease. Patient in addition also has right shoulder pain since the fall.  Consultants:  Cardiology  Pulmonary  Orthopedics  Procedures:  none  Antibiotics:  none  HPI/Subjective: Complains of abd discomfort, no N/V  Objective: Filed Vitals:   06/03/14 1653 06/03/14 2024 06/04/14 0421 06/04/14 1025  BP: 100/71 121/59 119/77 126/51  Pulse: 63 67 66 68  Temp:  97.7 F (36.5 C) 97.9 F (36.6 C) 98.3 F (36.8 C)  TempSrc:  Oral Oral Oral  Resp: 20 22 20 18   Height:      Weight:      SpO2: 100% 97% 96% 97%    Intake/Output Summary (Last 24 hours) at 06/04/14 1121 Last data filed at 06/04/14 0700  Gross per 24 hour  Intake    120 ml  Output    350 ml  Net   -230 ml    Exam:   General: AAO to self and place  Eyes: anicteric no pallor.  ENT: no discharge from ears eyes nose and mouth.  Neck: no mass felt.  Cardiovascular: S1-S2/RRR  Respiratory: diminsihed BS at bases  Abdomen: soft nontender bowel sounds present no guarding or rigidity.  Skin: no rash.  Musculoskeletal: no edema, dressing C/D/I  Psychiatric: appears normal.        Data Reviewed: Basic Metabolic Panel:  Recent Labs Lab 05/31/14 2013 06/01/14 0259 06/02/14 0319 06/03/14 0200 06/04/14 0450  NA 139 139 141 139 138  K 5.1 4.9 4.9 4.5 4.1  CL 103 103 106 102 100  CO2 24 22 21 22 24   GLUCOSE 186* 196* 181* 196* 168*  BUN 25* 26* 30* 30* 31*  CREATININE 1.30 1.19 1.10 1.00 0.93  CALCIUM 8.7 8.8 8.9 9.1 8.8    Liver Function Tests:  Recent Labs Lab 05/31/14 0315 06/01/14 0259  AST 17 24  ALT 12 12  ALKPHOS 94 84  BILITOT 0.4 0.5  PROT  6.9 6.7  ALBUMIN 3.3* 3.1*   No results for input(s): LIPASE, AMYLASE in the last 168 hours.  Recent Labs Lab 06/01/14 0304  AMMONIA 22    CBC:  Recent Labs Lab 05/30/14 1701 05/31/14 0315 05/31/14 2013 06/01/14 0259 06/02/14 0319 06/03/14 0200 06/04/14 0450  WBC 9.6 9.9 9.0 8.8 7.7 9.3 7.8  NEUTROABS 7.9* 8.5*  --   --   --   --   --   HGB 12.4* 12.1* 11.6* 11.6* 10.1* 10.3* 9.6*  HCT 37.9* 37.4* 35.5* 35.8* 31.5* 31.6* 29.5*  MCV 89.4 89.5 90.1 89.5 89.2 88.8 88.6  PLT 156 151 125* 141* 140* 161 164    Cardiac Enzymes:  Recent Labs Lab 05/30/14 1701 05/31/14 0315 05/31/14 0615 05/31/14 1235 05/31/14 1649  TROPONINI <0.30 0.87* 0.75* 0.51* 0.52*   BNP (last 3 results)  Recent Labs  05/31/14 0315  PROBNP 7931.0*     CBG:  Recent Labs Lab 06/03/14 1652 06/03/14 2015 06/04/14 0017 06/04/14 0417 06/04/14 0733  GLUCAP 152* 169* 155* 159* 176*    Recent Results (from the past 240 hour(s))  Surgical pcr screen     Status: Abnormal   Collection Time: 05/31/14  1:33 AM  Result Value Ref Range Status   MRSA, PCR NEGATIVE NEGATIVE Final   Staphylococcus aureus POSITIVE (A) NEGATIVE Final    Comment:        The Xpert SA Assay (FDA approved for NASAL specimens in patients over 21 years of age), is one component of a comprehensive surveillance program.  Test performance has been validated by [employer] for patients greater than or equal to 54 year old. It is not intended to diagnose infection nor to guide or monitor treatment. RESULT CALLED TO, READ BACK BY AND VERIFIED WITH: A WEATHERFORD,RN 05/31/14 0400 BY RHOLMES   Urine culture     Status: None   Collection Time: 05/31/14  2:06 AM  Result Value Ref Range Status   Specimen Description URINE, CATHETERIZED  Final   Special Requests NONE  Final   Culture  Setup Time   Final    05/31/2014 10:06 Performed at [place] Performed at [facility]   Final   Culture NO GROWTH Performed at [facility]   Final   Report Status 06/01/2014 FINAL  Final  Culture, Urine     Status: None   Collection Time: 05/31/14  9:21 PM  Result Value Ref Range Status  Specimen Description URINE, RANDOM  Final   Special Requests ADDED [id number]  Final   Culture  Setup Time   Final    06/01/2014 10:17 Performed at [place] Performed at [facility]   Final   Culture NO GROWTH Performed at [facility]   Final   Report Status 06/02/2014 FINAL  Final     Studies: Dg Chest 2 View  06/03/2014   CLINICAL DATA:  Chest pain and difficulty breathing  EXAM: CHEST  2 VIEW  COMPARISON:  May 31, 2014 and August 26, 2013  FINDINGS: There is chronic interstitial fibrosis in both mid and lower lung zones. There is no frank edema or consolidation. Heart is enlarged with pulmonary vascularity within normal limits. No adenopathy. There is atherosclerotic change in aorta. No adenopathy. No appreciable bone lesions.  IMPRESSION: Chronic interstitial fibrotic type change. No frank edema or consolidation. Stable cardiomegaly. No adenopathy.   Electronically Signed   By: Lowella Grip M.D.   On: 06/03/2014 12:03   Dg Thoracic Spine 2 View  05/30/2014   CLINICAL DATA:  Fall, mid back pain  EXAM: THORACIC SPINE - 2 VIEW  COMPARISON:  None.  FINDINGS: Normal thoracic kyphosis.  No evidence of fracture dislocation. Vertebral body heights are maintained.  Moderate multilevel degenerative changes.  Vascular calcifications.  Visualized lungs are clear.  IMPRESSION: No fracture or dislocation is seen.  Moderate multilevel degenerative changes.   Electronically Signed   By: Julian Hy M.D.   On: 05/30/2014 19:23   Dg Lumbar Spine Complete  05/30/2014   CLINICAL DATA:  Fall, mid/lower back pain  EXAM: LUMBAR SPINE - COMPLETE 4+ VIEW  COMPARISON:  CT abdomen dated 08/27/2013  FINDINGS: Five  lumbar type vertebral bodies.  Normal lumbar lordosis.  No evidence of fracture or dislocation. Vertebral body heights are maintained.  Grade 1 anterolisthesis of L4 on L5.  Moderate multilevel degenerative changes.  Visualized bony pelvis appears intact.  Vascular calcifications with known infrarenal abdominal aortic aneurysm.a  IMPRESSION: No fracture or dislocation is seen.  Moderate degenerative changes.  Grade 1 anterolisthesis of L4 on L5.   Electronically Signed   By: Julian Hy M.D.   On: 05/30/2014 20:01   Dg Shoulder Right  05/30/2014   CLINICAL DATA:  Fall, right shoulder pain  EXAM: RIGHT SHOULDER - 2+ VIEW  COMPARISON:  None.  FINDINGS: No fracture or dislocation is seen.  Moderate degenerative changes of the glenohumeral joint and acromioclavicular joint  The visualized soft tissues are unremarkable.  Visualized right lung is clear.  IMPRESSION: No fracture or dislocation is seen.  Moderate degenerative changes.   Electronically Signed   By: Julian Hy M.D.   On: 05/30/2014 19:22   Dg Hip Complete Left  05/30/2014   CLINICAL DATA:  Left hip pain after falling at home today at 1500 hr.  EXAM: LEFT HIP - COMPLETE 2+ VIEW  COMPARISON:  Portable pelvis obtained earlier today.  FINDINGS: Left femoral neck fracture with mild valgus angulation. Atheromatous arterial calcifications.  IMPRESSION: Previously noted mildly displaced left femoral neck fracture with mild valgus angulation.   Electronically Signed   By: Enrique Sack M.D.   On: 05/30/2014 19:21   Ct Head Wo Contrast  05/30/2014   CLINICAL DATA:  Right neck pain after falling today and hitting his shoulder against a door jamb.  EXAM: CT HEAD WITHOUT CONTRAST  CT CERVICAL SPINE WITHOUT CONTRAST  TECHNIQUE: Multidetector CT imaging of the head and cervical spine was performed following the standard protocol without intravenous contrast. Multiplanar CT image reconstructions of the cervical spine were also generated.  COMPARISON:   10/01/2013.  FINDINGS: CT HEAD FINDINGS  Diffusely enlarged ventricles and subarachnoid spaces. Patchy white matter low density in both cerebral hemispheres. No skull fracture, intracranial hemorrhage or paranasal sinus air-fluid levels. Dense arterial calcifications at the skull base.  CT CERVICAL SPINE FINDINGS  Extensive multilevel cervical spine degenerative changes. No prevertebral soft tissue swelling, fractures or subluxations. Bilateral vertebral and carotid artery atheromatous calcifications. Bullous changes at both lung apices.  IMPRESSION: 1. No skull fracture or intracranial hemorrhage. 2. No cervical spine fracture or subluxation. 3. Stable mid atrophy and chronic small vessel white matter ischemic changes. 4. Stable marked cervical spine degenerative changes. 5. COPD.   Electronically Signed   By: Enrique Sack M.D.   On: 05/30/2014 19:07   Ct Chest W Contrast  05/30/2014   CLINICAL DATA:  [place] today. Complaining of right shoulder and left hip pain.  EXAM: CT CHEST, ABDOMEN, AND PELVIS WITH CONTRAST  TECHNIQUE: Multidetector CT imaging of the chest, abdomen and pelvis was performed following the standard protocol during bolus administration of intravenous contrast.  CONTRAST:  129mL OMNIPAQUE IOHEXOL 300 MG/ML  SOLN  COMPARISON:  Chest CT, 08/30/2013.  FINDINGS: CT CHEST FINDINGS  Heart is mildly enlarged. There are dense coronary artery and mitral valve annular calcifications. Aorta is normal in caliber. There is partly calcified mild plaque along the thoracic aorta and at the origin of the left subclavian artery. No significant stenosis.  Right left pulmonary arteries are dilated to 3 cm, stable.  Borderline enlarged right peritracheal lymph node measures 1 cm short axis, stable. No mediastinal masses. No hilar masses or adenopathy.  Lungs show heterogeneous coarse peripheral reticular opacities consistent with interstitial fibrosis, with a pattern suggesting UIP. Focal opacity noted in the left  lower lobe on the prior exam measuring 15 mm x 7 mm, is without significant change. No acute lung consolidation or evidence of edema. No pleural effusion or pneumothorax.  CT ABDOMEN AND PELVIS FINDINGS  Liver shows morphologic changes consistent with cirrhosis. No liver mass or focal lesion. No contusion or laceration.  Mean is prominent measuring 12.4 cm in greatest dimension. No splenic mass or focal lesion.  Gallbladder is unremarkable. No bile duct dilation. No pancreatic masses or inflammation. No adrenal masses.  Mild bilateral renal cortical thinning. There is a peripherally calcified hypo attenuating, 3.1 cm mass protruding from the inferior margin of the left kidney, not imaged on the prior chest CT. This is likely a mildly complicated cyst. There is a low attenuation lesion in the upper pole left kidney, most likely a simple cyst. No other renal masses or lesions. No hydronephrosis. Normal ureters. Bladder is unremarkable.  There is an infrarenal abdominal aortic aneurysm measuring 3.7 cm in greatest anterior-posterior dimension. No evidence of rupture. Atherosclerotic changes noted along the abdominal aorta and its branch vessels.  No pathologically enlarged lymph nodes. No abnormal fluid collections. Specifically no evidence of hemo peritoneum.  No bowel wall thickening to suggest a bowel hematoma. No bowel inflammatory changes. No mesenteric inflammation or contusion/hematoma.  MUSCULOSKELETAL:  There fractures of the right acetabulum. Fracture extends across the posterior column, extending across the medial acetabulum to the anterior,. Fracture is mildly distracted by 7 mm. There is mild fracture comminution with small fracture fragments noted between the major fracture components, at least 1  within the hip joint.  There is a fracture of the left femoral neck. This is subcapital, oblique and non comminuted. Fracture is displaced with the distal fracture component migrating superiorly by 17 mm. There  is also mild varus angulation as well as significant apex anterior angulation.  No other pelvic or proximal femur fractures.  There are no other fractures.  IMPRESSION: 1. Fractures of the right acetabulum involving the anterior posterior columns. 2. Fracture of the left femoral neck, mildly displaced and angulated. 3. No other fractures. 4. No other acute findings in the chest, abdomen or pelvis. No other evidence of acute injury. 5. Chronic findings include cardiomegaly, coronary artery calcifications and changes consistent with interstitial fibrosis. Pulmonary artery dilation suggest pulmonary artery hypertension. Below the diaphragm, there changes of cirrhosis with borderline splenomegaly. Probable left renal cysts. 6. 3.7 cm infrarenal abdominal aortic aneurysm. No evidence of rupture.   Electronically Signed   By: Lajean Manes M.D.   On: 05/30/2014 22:06   Ct Cervical Spine Wo Contrast  05/30/2014   CLINICAL DATA:  Right neck pain after falling today and hitting his shoulder against a door jamb.  EXAM: CT HEAD WITHOUT CONTRAST  CT CERVICAL SPINE WITHOUT CONTRAST  TECHNIQUE: Multidetector CT imaging of the head and cervical spine was performed following the standard protocol without intravenous contrast. Multiplanar CT image reconstructions of the cervical spine were also generated.  COMPARISON:  10/01/2013.  FINDINGS: CT HEAD FINDINGS  Diffusely enlarged ventricles and subarachnoid spaces. Patchy white matter low density in both cerebral hemispheres. No skull fracture, intracranial hemorrhage or paranasal sinus air-fluid levels. Dense arterial calcifications at the skull base.  CT CERVICAL SPINE FINDINGS  Extensive multilevel cervical spine degenerative changes. No prevertebral soft tissue swelling, fractures or subluxations. Bilateral vertebral and carotid artery atheromatous calcifications. Bullous changes at both lung apices.  IMPRESSION: 1. No skull fracture or intracranial hemorrhage. 2. No cervical  spine fracture or subluxation. 3. Stable mid atrophy and chronic small vessel white matter ischemic changes. 4. Stable marked cervical spine degenerative changes. 5. COPD.   Electronically Signed   By: Enrique Sack M.D.   On: 05/30/2014 19:07   Ct Abdomen Pelvis W Contrast  05/30/2014   CLINICAL DATA:  [place] today. Complaining of right shoulder and left hip pain.  EXAM: CT CHEST, ABDOMEN, AND PELVIS WITH CONTRAST  TECHNIQUE: Multidetector CT imaging of the chest, abdomen and pelvis was performed following the standard protocol during bolus administration of intravenous contrast.  CONTRAST:  150mL OMNIPAQUE IOHEXOL 300 MG/ML  SOLN  COMPARISON:  Chest CT, 08/30/2013.  FINDINGS: CT CHEST FINDINGS  Heart is mildly enlarged. There are dense coronary artery and mitral valve annular calcifications. Aorta is normal in caliber. There is partly calcified mild plaque along the thoracic aorta and at the origin of the left subclavian artery. No significant stenosis.  Right left pulmonary arteries are dilated to 3 cm, stable.  Borderline enlarged right peritracheal lymph node measures 1 cm short axis, stable. No mediastinal masses. No hilar masses or adenopathy.  Lungs show heterogeneous coarse peripheral reticular opacities consistent with interstitial fibrosis, with a pattern suggesting UIP. Focal opacity noted in the left lower lobe on the prior exam measuring 15 mm x 7 mm, is without significant change. No acute lung consolidation or evidence of edema. No pleural effusion or pneumothorax.  CT ABDOMEN AND PELVIS FINDINGS  Liver shows morphologic changes consistent with cirrhosis. No liver mass or focal lesion. No contusion or laceration.  Mean is prominent  measuring 12.4 cm in greatest dimension. No splenic mass or focal lesion.  Gallbladder is unremarkable. No bile duct dilation. No pancreatic masses or inflammation. No adrenal masses.  Mild bilateral renal cortical thinning. There is a peripherally calcified hypo  attenuating, 3.1 cm mass protruding from the inferior margin of the left kidney, not imaged on the prior chest CT. This is likely a mildly complicated cyst. There is a low attenuation lesion in the upper pole left kidney, most likely a simple cyst. No other renal masses or lesions. No hydronephrosis. Normal ureters. Bladder is unremarkable.  There is an infrarenal abdominal aortic aneurysm measuring 3.7 cm in greatest anterior-posterior dimension. No evidence of rupture. Atherosclerotic changes noted along the abdominal aorta and its branch vessels.  No pathologically enlarged lymph nodes. No abnormal fluid collections. Specifically no evidence of hemo peritoneum.  No bowel wall thickening to suggest a bowel hematoma. No bowel inflammatory changes. No mesenteric inflammation or contusion/hematoma.  MUSCULOSKELETAL:  There fractures of the right acetabulum. Fracture extends across the posterior column, extending across the medial acetabulum to the anterior,. Fracture is mildly distracted by 7 mm. There is mild fracture comminution with small fracture fragments noted between the major fracture components, at least 1 within the hip joint.  There is a fracture of the left femoral neck. This is subcapital, oblique and non comminuted. Fracture is displaced with the distal fracture component migrating superiorly by 17 mm. There is also mild varus angulation as well as significant apex anterior angulation.  No other pelvic or proximal femur fractures.  There are no other fractures.  IMPRESSION: 1. Fractures of the right acetabulum involving the anterior posterior columns. 2. Fracture of the left femoral neck, mildly displaced and angulated. 3. No other fractures. 4. No other acute findings in the chest, abdomen or pelvis. No other evidence of acute injury. 5. Chronic findings include cardiomegaly, coronary artery calcifications and changes consistent with interstitial fibrosis. Pulmonary artery dilation suggest pulmonary  artery hypertension. Below the diaphragm, there changes of cirrhosis with borderline splenomegaly. Probable left renal cysts. 6. 3.7 cm infrarenal abdominal aortic aneurysm. No evidence of rupture.   Electronically Signed   By: Lajean Manes M.D.   On: 05/30/2014 22:06   Pelvis Portable  06/01/2014   CLINICAL DATA:  Postop left total hip.  EXAM: PORTABLE PELVIS 1-2 VIEWS  COMPARISON:  05/31/2014  FINDINGS: Patient has undergone total hip arthroplasty. Left hip prosthesis appears well seated. No evidence for dislocation on this frontal view. There is asymmetry of the SI joint, likely related to pelvic fractures. Right acetabular fracture again noted. There is deformity of the right inferior pubic ramus.  IMPRESSION: 1. Status post left total hip arthroplasty. 2. No evidence for dislocation on this frontal view.   Electronically Signed   By: Shon Hale M.D.   On: 06/01/2014 17:07   Dg Pelvis Portable  05/30/2014   CLINICAL DATA:  [place] today at 3 o'clock. Tripped over a door way. Left hip pain.  EXAM: PORTABLE PELVIS 1-2 VIEWS PORTABLE ONE-VIEW CHEST X-RAY.  COMPARISON:  HIP RADIOGRAPHS 10/01/2013 AND CHEST X-RAY 08/26/2013.  FINDINGS: CHEST X-RAY:  The heart is mildly enlarged but stable. There is tortuosity and calcification of the thoracic aorta. Chronic lung changes with pulmonary fibrosis. No definite acute overlying pulmonary process. The bony thorax is grossly intact.  Pelvis:  There is a displaced left femoral neck fracture. The right hip is intact. Moderate degenerative changes bilaterally, right greater than left. The pubic symphysis and SI  joints are intact. No pelvic fractures. Extensive vascular calcifications are noted.  IMPRESSION: 1. Displaced left femoral neck fracture. 2. No acute cardiopulmonary findings. Chronic lung changes/pulmonary fibrosis.   Electronically Signed   By: Kalman Jewels M.D.   On: 05/30/2014 17:36   Dg Pelvis Comp Min 3v  05/31/2014   CLINICAL DATA:  Right acetabular  fracture  EXAM: JUDET PELVIS - 3+ VIEW  COMPARISON:  05/30/2014  FINDINGS: Again noted changes consistent with a left femoral neck fracture with impaction. The right acetabular fracture is again noted and unchanged. No right femoral abnormality seen.  IMPRESSION: Right acetabular fracture stable from the prior exam.  Left femoral neck fracture stable from the prior exam.   Electronically Signed   By: Inez Catalina M.D.   On: 05/31/2014 11:34   Ct 3d Recon At Scanner  05/31/2014   CLINICAL DATA:  Nonspecific (abnormal) findings on radiological and other examination of musculoskeletal system. Right acetabular fracture. Left femoral neck fracture.  EXAM: 3-DIMENSIONAL CT IMAGE RENDERING ON ACQUISITION WORKSTATION  TECHNIQUE: 3-dimensional CT images were rendered by post-processing of the original CT data on an acquisition workstation.  COMPARISON:  CT scan and radiographs dated 05/30/2014  FINDINGS: There is a oblique vertical fracture through the medial aspect of the right acetabulum the with approximately 8 mm of distraction. There is also a subtle old healed fracture of the right inferior pubic ramus. I do not think these are acute.  There is an acute subcapital fracture of the left femoral neck with impaction and angulation. There are slight arthritic changes of the superior lateral aspect of the left acetabulum.  The 3D images better demonstrate the right acetabular fracture.  IMPRESSION: Slightly distracted subacute fracture through the medial aspect of the right acetabulum. Old healed fracture of the low right inferior pubic ramus.   Electronically Signed   By: Rozetta Nunnery M.D.   On: 05/31/2014 11:37   Dg Chest Port 1 View  05/31/2014   CLINICAL DATA:  80 year old male with low oxygen level. Subsequent encounter.  EXAM: PORTABLE CHEST - 1 VIEW  COMPARISON:  05/30/2014 and 08/26/2013.  FINDINGS: Pulmonary vascular congestion superimposed upon chronic lung changes.  Elevated left hemidiaphragm with left base  subsegmental atelectasis.  No gross pneumothorax.  Cardiomegaly.  Calcified mildly tortuous aorta.  IMPRESSION: Pulmonary vascular congestion superimposed upon chronic lung changes.  Elevated left hemidiaphragm with left base subsegmental atelectasis.  No gross pneumothorax.  Cardiomegaly.  Calcified mildly tortuous aorta.   Electronically Signed   By: Chauncey Cruel M.D.   On: 05/31/2014 06:55   Dg Chest Port 1 View  05/30/2014   CLINICAL DATA:  80 year old male who fell at 1500 hr today tripping over door way. Acute pain. Initial encounter.  EXAM: PORTABLE CHEST - 1 VIEW  COMPARISON:  Chest CT 08/30/2013 and earlier.  FINDINGS: Portable AP semi upright view at 1716 hr. Lower lung volumes. Stable cardiomegaly and mediastinal contours. Peripheral increased opacity greater in the left lung is stable. No pneumothorax, pulmonary edema, or pleural effusion identified. No acute pulmonary opacity identified. Calcified atherosclerosis of the aorta. Visualized tracheal air column is within normal limits. No acute osseous abnormality identified.  IMPRESSION: Cardiomegaly and chronic lung disease. No superimposed acute findings are identified.   Electronically Signed   By: Lars Pinks M.D.   On: 05/30/2014 17:35    Scheduled Meds: . aspirin EC  81 mg Oral Daily  . calcium carbonate  2 tablet Oral Q breakfast  . cholecalciferol  1,000 Units Oral Daily  . enoxaparin (LOVENOX) injection  40 mg Subcutaneous Q24H  . finasteride  5 mg Oral Daily  . insulin aspart  0-9 Units Subcutaneous TID WC  . nicotine  14 mg Transdermal Daily  . omega-3 acid ethyl esters  1,000 mg Oral BID  . polyethylene glycol  17 g Oral BID  . rosuvastatin  10 mg Oral Daily  . senna  1 tablet Oral BID  . sotalol  80 mg Oral BID   Continuous Infusions:    Principal Problem:   Hip fracture, left Active Problems:   Interstitial lung disease   Hyperlipidemia   History of atrial fibrillation   Diabetes mellitus, controlled   Hip  fracture   COPD exacerbation   Elevated troponin   Personal history of other diseases of circulatory system   Pulmonary fibrosis   Closed left hip fracture    Time spent: 30 minutes   [facility] Pager [phone]. If 7PM-7AM, please contact night-coverage at [email or web address], password [email or web address] 06/04/2014, 11:21 AM  LOS: 5 days

## 2014-06-04 NOTE — Progress Notes (Addendum)
Subjective:  Patient reports pain as mild  Objective:   VITALS:   Filed Vitals:   06/03/14 1438 06/03/14 1653 06/03/14 2024 06/04/14 0421  BP: 120/56 100/71 121/59 119/77  Pulse: 67 63 67 66  Temp: 98.7 F (37.1 C)  97.7 F (36.5 C) 97.9 F (36.6 C)  TempSrc: Oral  Oral Oral  Resp: 21 20 22 20   Height:      Weight:      SpO2: 99% 100% 97% 96%    Physical Exam   Dressing: C/D/I  Compartments soft  SILT grossly to foot, 2+DP, +TA/GS/EHL  LABS  Results for orders placed or performed during the hospital encounter of 05/30/14 (from the past 24 hour(s))  Glucose, capillary     Status: Abnormal   Collection Time: 06/03/14 11:34 AM  Result Value Ref Range   Glucose-Capillary 174 (H) 70 - 99 mg/dL   Comment 1 Capillary Sample   Glucose, capillary     Status: Abnormal   Collection Time: 06/03/14  4:52 PM  Result Value Ref Range   Glucose-Capillary 152 (H) 70 - 99 mg/dL  Glucose, capillary     Status: Abnormal   Collection Time: 06/03/14  8:15 PM  Result Value Ref Range   Glucose-Capillary 169 (H) 70 - 99 mg/dL  Glucose, capillary     Status: Abnormal   Collection Time: 06/04/14 12:17 AM  Result Value Ref Range   Glucose-Capillary 155 (H) 70 - 99 mg/dL  Glucose, capillary     Status: Abnormal   Collection Time: 06/04/14  4:17 AM  Result Value Ref Range   Glucose-Capillary 159 (H) 70 - 99 mg/dL  CBC     Status: Abnormal   Collection Time: 06/04/14  4:50 AM  Result Value Ref Range   WBC 7.8 4.0 - 10.5 K/uL   RBC 3.33 (L) 4.22 - 5.81 MIL/uL   Hemoglobin 9.6 (L) 13.0 - 17.0 g/dL   HCT 29.5 (L) 39.0 - 52.0 %   MCV 88.6 78.0 - 100.0 fL   MCH 28.8 26.0 - 34.0 pg   MCHC 32.5 30.0 - 36.0 g/dL   RDW 14.3 11.5 - 15.5 %   Platelets 164 150 - 400 K/uL  Basic metabolic panel     Status: Abnormal   Collection Time: 06/04/14  4:50 AM  Result Value Ref Range   Sodium 138 137 - 147 mEq/L   Potassium 4.1 3.7 - 5.3 mEq/L   Chloride 100 96 - 112 mEq/L   CO2 24 19 - 32  mEq/L   Glucose, Bld 168 (H) 70 - 99 mg/dL   BUN 31 (H) 6 - 23 mg/dL   Creatinine, Ser 0.93 0.50 - 1.35 mg/dL   Calcium 8.8 8.4 - 10.5 mg/dL   GFR calc non Af Amer 74 (L) >90 mL/min   GFR calc Af Amer 86 (L) >90 mL/min   Anion gap 14 5 - 15  Glucose, capillary     Status: Abnormal   Collection Time: 06/04/14  7:33 AM  Result Value Ref Range   Glucose-Capillary 176 (H) 70 - 99 mg/dL     Assessment/Plan: 3 Days Post-Op   Principal Problem:   Hip fracture, left Active Problems:   Interstitial lung disease   Hyperlipidemia   History of atrial fibrillation   Diabetes mellitus, controlled   Hip fracture   COPD exacerbation   Elevated troponin   Personal history of other diseases of circulatory system   Pulmonary fibrosis   Closed left  hip fracture   PLAN: Weight Bearing: NWB RLE, WBAT LLE Dressings: Keep C/D/I VTE prophylaxis: no orthopedic contraindication to chemical prophylaxis Recommend D/C of foley unless contraindicated by primary team Dispo: SNF pending per primary   Edmonia Lynch, D 06/04/2014, 9:26 AM   Edmonia Lynch, MD Cell [phone]

## 2014-06-05 LAB — BASIC METABOLIC PANEL
Anion gap: 8 (ref 5–15)
BUN: 27 mg/dL — AB (ref 6–23)
CHLORIDE: 103 meq/L (ref 96–112)
CO2: 30 mEq/L (ref 19–32)
CREATININE: 0.87 mg/dL (ref 0.50–1.35)
Calcium: 8.7 mg/dL (ref 8.4–10.5)
GFR calc non Af Amer: 77 mL/min — ABNORMAL LOW (ref >90)
GFR, EST AFRICAN AMERICAN: 89 mL/min — AB (ref >90)
Glucose, Bld: 202 mg/dL — ABNORMAL HIGH (ref 70–99)
Potassium: 4.1 mEq/L (ref 3.7–5.3)
Sodium: 141 mEq/L (ref 137–147)

## 2014-06-05 LAB — CBC
HEMATOCRIT: 30.3 % — AB (ref 39.0–52.0)
HEMOGLOBIN: 9.9 g/dL — AB (ref 13.0–17.0)
MCH: 29 pg (ref 26.0–34.0)
MCHC: 32.7 g/dL (ref 30.0–36.0)
MCV: 88.9 fL (ref 78.0–100.0)
Platelets: 180 10*3/uL (ref 150–400)
RBC: 3.41 MIL/uL — ABNORMAL LOW (ref 4.22–5.81)
RDW: 14.3 % (ref 11.5–15.5)
WBC: 6.8 10*3/uL (ref 4.0–10.5)

## 2014-06-05 LAB — GLUCOSE, CAPILLARY
GLUCOSE-CAPILLARY: 185 mg/dL — AB (ref 70–99)
Glucose-Capillary: 195 mg/dL — ABNORMAL HIGH (ref 70–99)
Glucose-Capillary: 276 mg/dL — ABNORMAL HIGH (ref 70–99)
Glucose-Capillary: 145 mg/dL — ABNORMAL HIGH (ref 70–99)
Glucose-Capillary: 239 mg/dL — ABNORMAL HIGH (ref 70–99)

## 2014-06-05 MED ORDER — ZOLPIDEM TARTRATE 5 MG PO TABS
5.0000 mg | ORAL_TABLET | Freq: Once | ORAL | Status: AC
  Administered 2014-06-06: 5 mg via ORAL
  Filled 2014-06-05: qty 1

## 2014-06-05 MED ORDER — POLYETHYLENE GLYCOL 3350 17 G PO PACK
17.0000 g | PACK | Freq: Every day | ORAL | Status: DC
  Administered 2014-06-06: 17 g via ORAL
  Filled 2014-06-05: qty 1

## 2014-06-05 NOTE — Progress Notes (Signed)
Primary cardiologist: Dr. Darlin Coco  Seen for followup: Elevated troponin, history of atrial fibrillation  Subjective:    Feels "tired", complains of mild hip pain. No chest pain or palpitations.  Objective:   Temp:  [97.5 F (36.4 C)-98.3 F (36.8 C)] 97.5 F (36.4 C) (11/08 0428) Pulse Rate:  [58-69] 58 (11/08 0428) Resp:  [16-18] 16 (11/08 0428) BP: (121-127)/(51-67) 127/67 mmHg (11/08 0428) SpO2:  [93 %-98 %] 93 % (11/08 0428) Weight:  [182 lb (82.555 kg)] 182 lb (82.555 kg) (11/07 2003) Last BM Date: 06/04/14  Filed Weights   05/30/14 1650 06/04/14 2003  Weight: 182 lb (82.555 kg) 182 lb (82.555 kg)    Intake/Output Summary (Last 24 hours) at 06/05/14 0735 Last data filed at 06/04/14 1400  Gross per 24 hour  Intake      0 ml  Output    200 ml  Net   -200 ml   Telemetry: Sinus rhythm with PACs,  Exam:  General: Frail appearing, no distress.  Lungs: Clear, nonlabored.  Cardiac: RRR, no gallop.  Extremities: Mild edema.   Lab Results:  Basic Metabolic Panel:  Recent Labs Lab 06/02/14 0319 06/03/14 0200 06/04/14 0450  NA 141 139 138  K 4.9 4.5 4.1  CL 106 102 100  CO2 21 22 24   GLUCOSE 181* 196* 168*  BUN 30* 30* 31*  CREATININE 1.10 1.00 0.93  CALCIUM 8.9 9.1 8.8    Liver Function Tests:  Recent Labs Lab 05/31/14 0315 06/01/14 0259  AST 17 24  ALT 12 12  ALKPHOS 94 84  BILITOT 0.4 0.5  PROT 6.9 6.7  ALBUMIN 3.3* 3.1*    CBC:  Recent Labs Lab 06/02/14 0319 06/03/14 0200 06/04/14 0450  WBC 7.7 9.3 7.8  HGB 10.1* 10.3* 9.6*  HCT 31.5* 31.6* 29.5*  MCV 89.2 88.8 88.6  PLT 140* 161 164    Cardiac Enzymes:  Recent Labs Lab 05/31/14 0615 05/31/14 1235 05/31/14 1649  TROPONINI 0.75* 0.51* 0.52*    Echocardiogram (05/31/2014): Study Conclusions  - Left ventricle: Hypokinesis of the basal inferoseptal, inferior and inferolateral walls. The cavity size was mildly dilated. Systolic function was normal.  The estimated ejection fraction was in the range of 50% to 55%. Wall motion was normal; there were no regional wall motion abnormalities. Doppler parameters are consistent with abnormal left ventricular relaxation (grade 1 diastolic dysfunction). There was no evidence of elevated ventricular filling pressure by Doppler parameters. - Ventricular septum: Septal motion showed paradox. - Aortic valve: Trileaflet; mildly thickened, mildly calcified leaflets. There was mild regurgitation. - Aortic root: The aortic root was mildly dilated. - Ascending aorta: The ascending aorta was normal in size. - Mitral valve: Calcified annulus. Mildly thickened, mildly calcified leaflets . There was mild regurgitation. - Left atrium: The atrium was mildly dilated. - Right ventricle: The cavity size was moderately dilated. Wall thickness was normal. Systolic function was moderately reduced. - Right atrium: The atrium was mildly dilated. - Tricuspid valve: There was trivial regurgitation. - Inferior vena cava: The vessel was dilated. The respirophasic diameter changes were blunted (< 50%), consistent with elevated central venous pressure. - Pericardium, extracardiac: There was no pericardial effusion.   Medications:   Scheduled Medications: . aspirin EC  81 mg Oral Daily  . calcium carbonate  2 tablet Oral Q breakfast  . cholecalciferol  1,000 Units Oral Daily  . enoxaparin (LOVENOX) injection  40 mg Subcutaneous Q24H  . finasteride  5 mg Oral Daily  .  insulin aspart  0-9 Units Subcutaneous TID WC  . nicotine  14 mg Transdermal Daily  . omega-3 acid ethyl esters  1,000 mg Oral BID  . polyethylene glycol  17 g Oral BID  . rosuvastatin  10 mg Oral Daily  . senna  1 tablet Oral BID  . sotalol  80 mg Oral BID     PRN Medications: acetaminophen **OR** acetaminophen, alum & mag hydroxide-simeth, antiseptic oral rinse, bisacodyl, feeding supplement (ENSURE COMPLETE),  HYDROcodone-acetaminophen, menthol-cetylpyridinium **OR** phenol, ondansetron (ZOFRAN) IV   Assessment:   1. Demand ischemia with peak troponin I 0.75. No chest pain. LVEF normal by echocardiogram.  2. History of atrial fibrillation ablation. Has maintained sinus rhythm on Sotalol. Now on ASA.  3. Left femoral neck fracture after fall. POD# 5.  4. Severe COPD with recent hypoxic respiratory failure.  5. Hypertensin, blood pressure stable.   Plan/Discussion:    Continue ASA, Lovenox, Sotalol, Crestor. Intake and output are fairly even. SNF planned.   Satira Sark, M.D., F.A.C.C.

## 2014-06-05 NOTE — Progress Notes (Signed)
[facility] PROGRESS NOTE  [name] [name] LZJ:[id number] DOB: Jan 22, 1927 DOA: 05/30/2014 PCP: Mathews Argyle, MD  Assessment/Plan:   Left hip fracture status post mechanical fall       -Despite high cardiac and Pulm risk, tolerated surgery well       -appreciate Pulm and Cards input       -Ortho following       -DVT proph: lovenox       -DC foley, Physical therapy       -SNF hopefully tomorrow   Abnormal troponin-trending down likely secondary to demand ischemia, cardiology consulted, 2-D echo completed,  no wall motion abnormality noted, EF 50-55%, continue ASA/statin, remains on sotalol        Severe COPD/Chronic hypoxemia with interstitial lung disease        -Pulm consult appreciated,        -stable, continue nebs PRN   Diabetes mellitus type 2        -stable, continue SSI        Suspected UTI       -Stopped ceftriaxone, Urine Cx negative   History of atrial fibrillation status post ablation presently in sinus rhythm - continue sotalol.   Hypertension - continue present medications.   Hyperlipidemia - continue present medications.   Mild anemia - follow CBC and if there is no significant fall in hemoglobin further workup as outpatient        Constipation       -resolved       -continue senokot BID and miralax        Confusion/Encephalopathy      -due to meds, UTI, COPD etc      -caution with narcotics, monitor       -improved      DVT proph: lovenox  Code Status: full Family Communication: no family at bedside Disposition Plan:  SNF tomorrow    Brief narrative: [name] [name] is a 80 y.o. male with history of atrial fibrillation status post ablation, diabetes mellitus, hypertension and hyperlipidemia had a fall at his house when he was trying to cross the door. Patient did not hit his head or lose consciousness and patient was brought to the ER. In the ER workup revealed left hip fracture and also right acetabular fracture. Dr. Mardelle Matte was  consulted from orthopedics. Patient was found to be hypoxic requiring 100% nonrebreather. Patient otherwise denies any chest pain or shortness of breath or any productive cough fever chills nausea vomiting abdominal pain. Patient has known history of interstitial lung disease and since patient was short of breath CT chest was done which was not showing anything acute except for the known interstitial lung disease. Patient in addition also has right shoulder pain since the fall.  Consultants:  Cardiology  Pulmonary  Orthopedics  Procedures:  none  Antibiotics:  none  HPI/Subjective: Had BM finally, no N/V, breathing fair  Objective: Filed Vitals:   06/04/14 1640 06/04/14 2003 06/05/14 0428 06/05/14 1040  BP: 127/63 121/55 127/67 122/67  Pulse: 69 68 58 63  Temp: 98.1 F (36.7 C) 97.8 F (36.6 C) 97.5 F (36.4 C) 98.7 F (37.1 C)  TempSrc: Oral Oral Oral Oral  Resp: 16 16 16 20   Height:      Weight:  82.555 kg (182 lb)    SpO2: 94% 98% 93% 96%    Intake/Output Summary (Last 24 hours) at 06/05/14 1048 Last data filed at 06/04/14 1400  Gross per 24 hour  Intake  0 ml  Output    200 ml  Net   -200 ml    Exam:   General: AAO to self and place  Eyes: anicteric no pallor.  Neck: no mass, lymphadenopathy  Cardiovascular: S1-S2/RRR  Respiratory: diminsihed BS at bases  Abdomen: soft nontender bowel sounds present no guarding or rigidity.  Skin: no rash.  Musculoskeletal: no edema, dressing C/D/I  Psychiatric: appears normal.        Data Reviewed: Basic Metabolic Panel:  Recent Labs Lab 06/01/14 0259 06/02/14 0319 06/03/14 0200 06/04/14 0450 06/05/14 0753  NA 139 141 139 138 141  K 4.9 4.9 4.5 4.1 4.1  CL 103 106 102 100 103  CO2 22 21 22 24 30   GLUCOSE 196* 181* 196* 168* 202*  BUN 26* 30* 30* 31* 27*  CREATININE 1.19 1.10 1.00 0.93 0.87  CALCIUM 8.8 8.9 9.1 8.8 8.7    Liver Function Tests:  Recent Labs Lab 05/31/14 0315  06/01/14 0259  AST 17 24  ALT 12 12  ALKPHOS 94 84  BILITOT 0.4 0.5  PROT 6.9 6.7  ALBUMIN 3.3* 3.1*   No results for input(s): LIPASE, AMYLASE in the last 168 hours.  Recent Labs Lab 06/01/14 0304  AMMONIA 22    CBC:  Recent Labs Lab 05/30/14 1701 05/31/14 0315  06/01/14 0259 06/02/14 0319 06/03/14 0200 06/04/14 0450 06/05/14 0753  WBC 9.6 9.9  < > 8.8 7.7 9.3 7.8 6.8  NEUTROABS 7.9* 8.5*  --   --   --   --   --   --   HGB 12.4* 12.1*  < > 11.6* 10.1* 10.3* 9.6* 9.9*  HCT 37.9* 37.4*  < > 35.8* 31.5* 31.6* 29.5* 30.3*  MCV 89.4 89.5  < > 89.5 89.2 88.8 88.6 88.9  PLT 156 151  < > 141* 140* 161 164 180  < > = values in this interval not displayed.  Cardiac Enzymes:  Recent Labs Lab 05/30/14 1701 05/31/14 0315 05/31/14 0615 05/31/14 1235 05/31/14 1649  TROPONINI <0.30 0.87* 0.75* 0.51* 0.52*   BNP (last 3 results)  Recent Labs  05/31/14 0315  PROBNP 7931.0*     CBG:  Recent Labs Lab 06/04/14 1639 06/04/14 2001 06/04/14 2344 06/05/14 0412 06/05/14 0804  GLUCAP 197* 196* 188* 185* 195*    Recent Results (from the past 240 hour(s))  Surgical pcr screen     Status: Abnormal   Collection Time: 05/31/14  1:33 AM  Result Value Ref Range Status   MRSA, PCR NEGATIVE NEGATIVE Final   Staphylococcus aureus POSITIVE (A) NEGATIVE Final    Comment:        The Xpert SA Assay (FDA approved for NASAL specimens in patients over 31 years of age), is one component of a comprehensive surveillance program.  Test performance has been validated by [employer] for patients greater than or equal to 83 year old. It is not intended to diagnose infection nor to guide or monitor treatment. RESULT CALLED TO, READ BACK BY AND VERIFIED WITH: A WEATHERFORD,RN 05/31/14 0400 BY RHOLMES   Urine culture     Status: None   Collection Time: 05/31/14  2:06 AM  Result Value Ref Range Status   Specimen Description URINE, CATHETERIZED  Final   Special Requests NONE   Final   Culture  Setup Time   Final    05/31/2014 10:06 Performed at [place] Performed at [facility]   Final  Culture NO GROWTH Performed at [facility]   Final   Report Status 06/01/2014 FINAL  Final  Culture, Urine     Status: None   Collection Time: 05/31/14  9:21 PM  Result Value Ref Range Status   Specimen Description URINE, RANDOM  Final   Special Requests ADDED [id number]  Final   Culture  Setup Time   Final    06/01/2014 10:17 Performed at [place] Performed at [facility]   Final   Culture NO GROWTH Performed at [facility]   Final   Report Status 06/02/2014 FINAL  Final     Studies: Dg Chest 2 View  06/03/2014   CLINICAL DATA:  Chest pain and difficulty breathing  EXAM: CHEST  2 VIEW  COMPARISON:  May 31, 2014 and August 26, 2013  FINDINGS: There is chronic interstitial fibrosis in both mid and lower lung zones. There is no frank edema or consolidation. Heart is enlarged with pulmonary vascularity within normal limits. No adenopathy. There is atherosclerotic change in aorta. No adenopathy. No appreciable bone lesions.  IMPRESSION: Chronic interstitial fibrotic type change. No frank edema or consolidation. Stable cardiomegaly. No adenopathy.   Electronically Signed   By: Lowella Grip M.D.   On: 06/03/2014 12:03   Dg Thoracic Spine 2 View  05/30/2014   CLINICAL DATA:  Fall, mid back pain  EXAM: THORACIC SPINE - 2 VIEW  COMPARISON:  None.  FINDINGS: Normal thoracic kyphosis.  No evidence of fracture dislocation. Vertebral body heights are maintained.  Moderate multilevel degenerative changes.  Vascular calcifications.  Visualized lungs are clear.  IMPRESSION: No fracture or dislocation is seen.  Moderate multilevel degenerative changes.   Electronically Signed   By: Julian Hy M.D.   On: 05/30/2014 19:23   Dg Lumbar Spine  Complete  05/30/2014   CLINICAL DATA:  Fall, mid/lower back pain  EXAM: LUMBAR SPINE - COMPLETE 4+ VIEW  COMPARISON:  CT abdomen dated 08/27/2013  FINDINGS: Five lumbar type vertebral bodies.  Normal lumbar lordosis.  No evidence of fracture or dislocation. Vertebral body heights are maintained.  Grade 1 anterolisthesis of L4 on L5.  Moderate multilevel degenerative changes.  Visualized bony pelvis appears intact.  Vascular calcifications with known infrarenal abdominal aortic aneurysm.a  IMPRESSION: No fracture or dislocation is seen.  Moderate degenerative changes.  Grade 1 anterolisthesis of L4 on L5.   Electronically Signed   By: Julian Hy M.D.   On: 05/30/2014 20:01   Dg Shoulder Right  05/30/2014   CLINICAL DATA:  Fall, right shoulder pain  EXAM: RIGHT SHOULDER - 2+ VIEW  COMPARISON:  None.  FINDINGS: No fracture or dislocation is seen.  Moderate degenerative changes of the glenohumeral joint and acromioclavicular joint  The visualized soft tissues are unremarkable.  Visualized right lung is clear.  IMPRESSION: No fracture or dislocation is seen.  Moderate degenerative changes.   Electronically Signed   By: Julian Hy M.D.   On: 05/30/2014 19:22   Dg Hip Complete Left  05/30/2014   CLINICAL DATA:  Left hip pain after falling at home today at 1500 hr.  EXAM: LEFT HIP - COMPLETE 2+ VIEW  COMPARISON:  Portable pelvis obtained earlier today.  FINDINGS: Left femoral neck fracture with mild valgus angulation. Atheromatous arterial calcifications.  IMPRESSION: Previously noted mildly displaced left femoral neck fracture with mild valgus angulation.   Electronically Signed   By: Georg Ruddle.D.  On: 05/30/2014 19:21   Ct Head Wo Contrast  05/30/2014   CLINICAL DATA:  Right neck pain after falling today and hitting his shoulder against a door jamb.  EXAM: CT HEAD WITHOUT CONTRAST  CT CERVICAL SPINE WITHOUT CONTRAST  TECHNIQUE: Multidetector CT imaging of the head and cervical spine was  performed following the standard protocol without intravenous contrast. Multiplanar CT image reconstructions of the cervical spine were also generated.  COMPARISON:  10/01/2013.  FINDINGS: CT HEAD FINDINGS  Diffusely enlarged ventricles and subarachnoid spaces. Patchy white matter low density in both cerebral hemispheres. No skull fracture, intracranial hemorrhage or paranasal sinus air-fluid levels. Dense arterial calcifications at the skull base.  CT CERVICAL SPINE FINDINGS  Extensive multilevel cervical spine degenerative changes. No prevertebral soft tissue swelling, fractures or subluxations. Bilateral vertebral and carotid artery atheromatous calcifications. Bullous changes at both lung apices.  IMPRESSION: 1. No skull fracture or intracranial hemorrhage. 2. No cervical spine fracture or subluxation. 3. Stable mid atrophy and chronic small vessel white matter ischemic changes. 4. Stable marked cervical spine degenerative changes. 5. COPD.   Electronically Signed   By: Enrique Sack M.D.   On: 05/30/2014 19:07   Ct Chest W Contrast  05/30/2014   CLINICAL DATA:  [place] today. Complaining of right shoulder and left hip pain.  EXAM: CT CHEST, ABDOMEN, AND PELVIS WITH CONTRAST  TECHNIQUE: Multidetector CT imaging of the chest, abdomen and pelvis was performed following the standard protocol during bolus administration of intravenous contrast.  CONTRAST:  134mL OMNIPAQUE IOHEXOL 300 MG/ML  SOLN  COMPARISON:  Chest CT, 08/30/2013.  FINDINGS: CT CHEST FINDINGS  Heart is mildly enlarged. There are dense coronary artery and mitral valve annular calcifications. Aorta is normal in caliber. There is partly calcified mild plaque along the thoracic aorta and at the origin of the left subclavian artery. No significant stenosis.  Right left pulmonary arteries are dilated to 3 cm, stable.  Borderline enlarged right peritracheal lymph node measures 1 cm short axis, stable. No mediastinal masses. No hilar masses or adenopathy.   Lungs show heterogeneous coarse peripheral reticular opacities consistent with interstitial fibrosis, with a pattern suggesting UIP. Focal opacity noted in the left lower lobe on the prior exam measuring 15 mm x 7 mm, is without significant change. No acute lung consolidation or evidence of edema. No pleural effusion or pneumothorax.  CT ABDOMEN AND PELVIS FINDINGS  Liver shows morphologic changes consistent with cirrhosis. No liver mass or focal lesion. No contusion or laceration.  Mean is prominent measuring 12.4 cm in greatest dimension. No splenic mass or focal lesion.  Gallbladder is unremarkable. No bile duct dilation. No pancreatic masses or inflammation. No adrenal masses.  Mild bilateral renal cortical thinning. There is a peripherally calcified hypo attenuating, 3.1 cm mass protruding from the inferior margin of the left kidney, not imaged on the prior chest CT. This is likely a mildly complicated cyst. There is a low attenuation lesion in the upper pole left kidney, most likely a simple cyst. No other renal masses or lesions. No hydronephrosis. Normal ureters. Bladder is unremarkable.  There is an infrarenal abdominal aortic aneurysm measuring 3.7 cm in greatest anterior-posterior dimension. No evidence of rupture. Atherosclerotic changes noted along the abdominal aorta and its branch vessels.  No pathologically enlarged lymph nodes. No abnormal fluid collections. Specifically no evidence of hemo peritoneum.  No bowel wall thickening to suggest a bowel hematoma. No bowel inflammatory changes. No mesenteric inflammation or contusion/hematoma.  MUSCULOSKELETAL:  There fractures  of the right acetabulum. Fracture extends across the posterior column, extending across the medial acetabulum to the anterior,. Fracture is mildly distracted by 7 mm. There is mild fracture comminution with small fracture fragments noted between the major fracture components, at least 1 within the hip joint.  There is a fracture of  the left femoral neck. This is subcapital, oblique and non comminuted. Fracture is displaced with the distal fracture component migrating superiorly by 17 mm. There is also mild varus angulation as well as significant apex anterior angulation.  No other pelvic or proximal femur fractures.  There are no other fractures.  IMPRESSION: 1. Fractures of the right acetabulum involving the anterior posterior columns. 2. Fracture of the left femoral neck, mildly displaced and angulated. 3. No other fractures. 4. No other acute findings in the chest, abdomen or pelvis. No other evidence of acute injury. 5. Chronic findings include cardiomegaly, coronary artery calcifications and changes consistent with interstitial fibrosis. Pulmonary artery dilation suggest pulmonary artery hypertension. Below the diaphragm, there changes of cirrhosis with borderline splenomegaly. Probable left renal cysts. 6. 3.7 cm infrarenal abdominal aortic aneurysm. No evidence of rupture.   Electronically Signed   By: Lajean Manes M.D.   On: 05/30/2014 22:06   Ct Cervical Spine Wo Contrast  05/30/2014   CLINICAL DATA:  Right neck pain after falling today and hitting his shoulder against a door jamb.  EXAM: CT HEAD WITHOUT CONTRAST  CT CERVICAL SPINE WITHOUT CONTRAST  TECHNIQUE: Multidetector CT imaging of the head and cervical spine was performed following the standard protocol without intravenous contrast. Multiplanar CT image reconstructions of the cervical spine were also generated.  COMPARISON:  10/01/2013.  FINDINGS: CT HEAD FINDINGS  Diffusely enlarged ventricles and subarachnoid spaces. Patchy white matter low density in both cerebral hemispheres. No skull fracture, intracranial hemorrhage or paranasal sinus air-fluid levels. Dense arterial calcifications at the skull base.  CT CERVICAL SPINE FINDINGS  Extensive multilevel cervical spine degenerative changes. No prevertebral soft tissue swelling, fractures or subluxations. Bilateral  vertebral and carotid artery atheromatous calcifications. Bullous changes at both lung apices.  IMPRESSION: 1. No skull fracture or intracranial hemorrhage. 2. No cervical spine fracture or subluxation. 3. Stable mid atrophy and chronic small vessel white matter ischemic changes. 4. Stable marked cervical spine degenerative changes. 5. COPD.   Electronically Signed   By: Enrique Sack M.D.   On: 05/30/2014 19:07   Ct Abdomen Pelvis W Contrast  05/30/2014   CLINICAL DATA:  [place] today. Complaining of right shoulder and left hip pain.  EXAM: CT CHEST, ABDOMEN, AND PELVIS WITH CONTRAST  TECHNIQUE: Multidetector CT imaging of the chest, abdomen and pelvis was performed following the standard protocol during bolus administration of intravenous contrast.  CONTRAST:  15mL OMNIPAQUE IOHEXOL 300 MG/ML  SOLN  COMPARISON:  Chest CT, 08/30/2013.  FINDINGS: CT CHEST FINDINGS  Heart is mildly enlarged. There are dense coronary artery and mitral valve annular calcifications. Aorta is normal in caliber. There is partly calcified mild plaque along the thoracic aorta and at the origin of the left subclavian artery. No significant stenosis.  Right left pulmonary arteries are dilated to 3 cm, stable.  Borderline enlarged right peritracheal lymph node measures 1 cm short axis, stable. No mediastinal masses. No hilar masses or adenopathy.  Lungs show heterogeneous coarse peripheral reticular opacities consistent with interstitial fibrosis, with a pattern suggesting UIP. Focal opacity noted in the left lower lobe on the prior exam measuring 15 mm x 7 mm, is without  significant change. No acute lung consolidation or evidence of edema. No pleural effusion or pneumothorax.  CT ABDOMEN AND PELVIS FINDINGS  Liver shows morphologic changes consistent with cirrhosis. No liver mass or focal lesion. No contusion or laceration.  Mean is prominent measuring 12.4 cm in greatest dimension. No splenic mass or focal lesion.  Gallbladder is  unremarkable. No bile duct dilation. No pancreatic masses or inflammation. No adrenal masses.  Mild bilateral renal cortical thinning. There is a peripherally calcified hypo attenuating, 3.1 cm mass protruding from the inferior margin of the left kidney, not imaged on the prior chest CT. This is likely a mildly complicated cyst. There is a low attenuation lesion in the upper pole left kidney, most likely a simple cyst. No other renal masses or lesions. No hydronephrosis. Normal ureters. Bladder is unremarkable.  There is an infrarenal abdominal aortic aneurysm measuring 3.7 cm in greatest anterior-posterior dimension. No evidence of rupture. Atherosclerotic changes noted along the abdominal aorta and its branch vessels.  No pathologically enlarged lymph nodes. No abnormal fluid collections. Specifically no evidence of hemo peritoneum.  No bowel wall thickening to suggest a bowel hematoma. No bowel inflammatory changes. No mesenteric inflammation or contusion/hematoma.  MUSCULOSKELETAL:  There fractures of the right acetabulum. Fracture extends across the posterior column, extending across the medial acetabulum to the anterior,. Fracture is mildly distracted by 7 mm. There is mild fracture comminution with small fracture fragments noted between the major fracture components, at least 1 within the hip joint.  There is a fracture of the left femoral neck. This is subcapital, oblique and non comminuted. Fracture is displaced with the distal fracture component migrating superiorly by 17 mm. There is also mild varus angulation as well as significant apex anterior angulation.  No other pelvic or proximal femur fractures.  There are no other fractures.  IMPRESSION: 1. Fractures of the right acetabulum involving the anterior posterior columns. 2. Fracture of the left femoral neck, mildly displaced and angulated. 3. No other fractures. 4. No other acute findings in the chest, abdomen or pelvis. No other evidence of acute  injury. 5. Chronic findings include cardiomegaly, coronary artery calcifications and changes consistent with interstitial fibrosis. Pulmonary artery dilation suggest pulmonary artery hypertension. Below the diaphragm, there changes of cirrhosis with borderline splenomegaly. Probable left renal cysts. 6. 3.7 cm infrarenal abdominal aortic aneurysm. No evidence of rupture.   Electronically Signed   By: Lajean Manes M.D.   On: 05/30/2014 22:06   Pelvis Portable  06/01/2014   CLINICAL DATA:  Postop left total hip.  EXAM: PORTABLE PELVIS 1-2 VIEWS  COMPARISON:  05/31/2014  FINDINGS: Patient has undergone total hip arthroplasty. Left hip prosthesis appears well seated. No evidence for dislocation on this frontal view. There is asymmetry of the SI joint, likely related to pelvic fractures. Right acetabular fracture again noted. There is deformity of the right inferior pubic ramus.  IMPRESSION: 1. Status post left total hip arthroplasty. 2. No evidence for dislocation on this frontal view.   Electronically Signed   By: Shon Hale M.D.   On: 06/01/2014 17:07   Dg Pelvis Portable  05/30/2014   CLINICAL DATA:  [place] today at 3 o'clock. Tripped over a door way. Left hip pain.  EXAM: PORTABLE PELVIS 1-2 VIEWS PORTABLE ONE-VIEW CHEST X-RAY.  COMPARISON:  HIP RADIOGRAPHS 10/01/2013 AND CHEST X-RAY 08/26/2013.  FINDINGS: CHEST X-RAY:  The heart is mildly enlarged but stable. There is tortuosity and calcification of the thoracic aorta. Chronic lung changes with  pulmonary fibrosis. No definite acute overlying pulmonary process. The bony thorax is grossly intact.  Pelvis:  There is a displaced left femoral neck fracture. The right hip is intact. Moderate degenerative changes bilaterally, right greater than left. The pubic symphysis and SI joints are intact. No pelvic fractures. Extensive vascular calcifications are noted.  IMPRESSION: 1. Displaced left femoral neck fracture. 2. No acute cardiopulmonary findings. Chronic lung  changes/pulmonary fibrosis.   Electronically Signed   By: Kalman Jewels M.D.   On: 05/30/2014 17:36   Dg Pelvis Comp Min 3v  05/31/2014   CLINICAL DATA:  Right acetabular fracture  EXAM: JUDET PELVIS - 3+ VIEW  COMPARISON:  05/30/2014  FINDINGS: Again noted changes consistent with a left femoral neck fracture with impaction. The right acetabular fracture is again noted and unchanged. No right femoral abnormality seen.  IMPRESSION: Right acetabular fracture stable from the prior exam.  Left femoral neck fracture stable from the prior exam.   Electronically Signed   By: Inez Catalina M.D.   On: 05/31/2014 11:34   Ct 3d Recon At Scanner  05/31/2014   CLINICAL DATA:  Nonspecific (abnormal) findings on radiological and other examination of musculoskeletal system. Right acetabular fracture. Left femoral neck fracture.  EXAM: 3-DIMENSIONAL CT IMAGE RENDERING ON ACQUISITION WORKSTATION  TECHNIQUE: 3-dimensional CT images were rendered by post-processing of the original CT data on an acquisition workstation.  COMPARISON:  CT scan and radiographs dated 05/30/2014  FINDINGS: There is a oblique vertical fracture through the medial aspect of the right acetabulum the with approximately 8 mm of distraction. There is also a subtle old healed fracture of the right inferior pubic ramus. I do not think these are acute.  There is an acute subcapital fracture of the left femoral neck with impaction and angulation. There are slight arthritic changes of the superior lateral aspect of the left acetabulum.  The 3D images better demonstrate the right acetabular fracture.  IMPRESSION: Slightly distracted subacute fracture through the medial aspect of the right acetabulum. Old healed fracture of the low right inferior pubic ramus.   Electronically Signed   By: Rozetta Nunnery M.D.   On: 05/31/2014 11:37   Dg Chest Port 1 View  05/31/2014   CLINICAL DATA:  80 year old male with low oxygen level. Subsequent encounter.  EXAM: PORTABLE  CHEST - 1 VIEW  COMPARISON:  05/30/2014 and 08/26/2013.  FINDINGS: Pulmonary vascular congestion superimposed upon chronic lung changes.  Elevated left hemidiaphragm with left base subsegmental atelectasis.  No gross pneumothorax.  Cardiomegaly.  Calcified mildly tortuous aorta.  IMPRESSION: Pulmonary vascular congestion superimposed upon chronic lung changes.  Elevated left hemidiaphragm with left base subsegmental atelectasis.  No gross pneumothorax.  Cardiomegaly.  Calcified mildly tortuous aorta.   Electronically Signed   By: Chauncey Cruel M.D.   On: 05/31/2014 06:55   Dg Chest Port 1 View  05/30/2014   CLINICAL DATA:  80 year old male who fell at 1500 hr today tripping over door way. Acute pain. Initial encounter.  EXAM: PORTABLE CHEST - 1 VIEW  COMPARISON:  Chest CT 08/30/2013 and earlier.  FINDINGS: Portable AP semi upright view at 1716 hr. Lower lung volumes. Stable cardiomegaly and mediastinal contours. Peripheral increased opacity greater in the left lung is stable. No pneumothorax, pulmonary edema, or pleural effusion identified. No acute pulmonary opacity identified. Calcified atherosclerosis of the aorta. Visualized tracheal air column is within normal limits. No acute osseous abnormality identified.  IMPRESSION: Cardiomegaly and chronic lung disease. No superimposed acute findings are  identified.   Electronically Signed   By: Lars Pinks M.D.   On: 05/30/2014 17:35    Scheduled Meds: . aspirin EC  81 mg Oral Daily  . calcium carbonate  2 tablet Oral Q breakfast  . cholecalciferol  1,000 Units Oral Daily  . enoxaparin (LOVENOX) injection  40 mg Subcutaneous Q24H  . finasteride  5 mg Oral Daily  . insulin aspart  0-9 Units Subcutaneous TID WC  . nicotine  14 mg Transdermal Daily  . omega-3 acid ethyl esters  1,000 mg Oral BID  . [START ON 06/06/2014] polyethylene glycol  17 g Oral Daily  . rosuvastatin  10 mg Oral Daily  . senna  1 tablet Oral BID  . sotalol  80 mg Oral BID   Continuous  Infusions:    Principal Problem:   Hip fracture, left Active Problems:   Interstitial lung disease   Hyperlipidemia   History of atrial fibrillation   Diabetes mellitus, controlled   Hip fracture   COPD exacerbation   Elevated troponin   Personal history of other diseases of circulatory system   Pulmonary fibrosis   Closed left hip fracture    Time spent: 30 minutes   [facility] Pager [phone]. If 7PM-7AM, please contact night-coverage at [email or web address], password [facility] 06/05/2014, 10:48 AM  LOS: 6 days

## 2014-06-05 NOTE — Plan of Care (Signed)
Problem: Phase II Progression Outcomes Goal: CMS/Neurovascular status WDL Outcome: Completed/Met Date Met:  06/05/14 Goal: Tolerating diet Outcome: Completed/Met Date Met:  06/05/14 Goal: Other Phase II Outcomes/Goals Outcome: Completed/Met Date Met:  06/05/14  Problem: Phase III Progression Outcomes Goal: Pain controlled on oral analgesia Outcome: Completed/Met Date Met:  06/05/14 Goal: Incision clean - minimal/no drainage Outcome: Completed/Met Date Met:  06/05/14 Goal: Other Phase III Outcomes/Goals Outcome: Completed/Met Date Met:  06/05/14

## 2014-06-05 NOTE — Plan of Care (Signed)
Problem: Phase I Progression Outcomes Goal: Pre op hemodynamically stable Outcome: Completed/Met Date Met:  06/05/14 Goal: Post op pain controlled with appropriate interventions Outcome: Completed/Met Date Met:  06/05/14 Goal: Post op hemodynamically stable Outcome: Completed/Met Date Met:  06/05/14 Goal: Post op CMS/Neurovascular status WDL Outcome: Completed/Met Date Met:  06/05/14

## 2014-06-05 NOTE — Social Work (Signed)
CSW contacted patient's daughter about bed offer to [facility] (preferred placement for patient). Left message to contact social work dept [phone]) to follow up.  Christene Lye MSW, [place]

## 2014-06-06 DIAGNOSIS — S72002S Fracture of unspecified part of neck of left femur, sequela: Secondary | ICD-10-CM

## 2014-06-06 LAB — BASIC METABOLIC PANEL
Anion gap: 11 (ref 5–15)
BUN: 22 mg/dL (ref 6–23)
CHLORIDE: 102 meq/L (ref 96–112)
CO2: 27 mEq/L (ref 19–32)
Calcium: 8.6 mg/dL (ref 8.4–10.5)
Creatinine, Ser: 0.81 mg/dL (ref 0.50–1.35)
GFR calc Af Amer: >90 mL/min (ref >90)
GFR calc non Af Amer: 79 mL/min — ABNORMAL LOW (ref >90)
Glucose, Bld: 221 mg/dL — ABNORMAL HIGH (ref 70–99)
Potassium: 4.2 mEq/L (ref 3.7–5.3)
Sodium: 140 mEq/L (ref 137–147)

## 2014-06-06 LAB — CBC
HCT: 30.8 % — ABNORMAL LOW (ref 39.0–52.0)
HEMOGLOBIN: 10 g/dL — AB (ref 13.0–17.0)
MCH: 29.4 pg (ref 26.0–34.0)
MCHC: 32.5 g/dL (ref 30.0–36.0)
MCV: 90.6 fL (ref 78.0–100.0)
Platelets: 188 10*3/uL (ref 150–400)
RBC: 3.4 MIL/uL — AB (ref 4.22–5.81)
RDW: 14.3 % (ref 11.5–15.5)
WBC: 6.3 10*3/uL (ref 4.0–10.5)

## 2014-06-06 LAB — GLUCOSE, CAPILLARY
GLUCOSE-CAPILLARY: 222 mg/dL — AB (ref 70–99)
GLUCOSE-CAPILLARY: 266 mg/dL — AB (ref 70–99)
Glucose-Capillary: 191 mg/dL — ABNORMAL HIGH (ref 70–99)

## 2014-06-06 MED ORDER — SENNA 8.6 MG PO TABS: 1.0000 | ORAL_TABLET | Freq: Two times a day (BID) | ORAL | Status: AC

## 2014-06-06 MED ORDER — GLIPIZIDE ER 10 MG PO TB24: 10.0000 mg | ORAL_TABLET | Freq: Every day | ORAL | Status: DC

## 2014-06-06 MED ORDER — HYDROCODONE-ACETAMINOPHEN 5-325 MG PO TABS: 1.0000 | ORAL_TABLET | Freq: Four times a day (QID) | ORAL | Status: DC | PRN

## 2014-06-06 MED ORDER — ENOXAPARIN SODIUM 40 MG/0.4ML ~~LOC~~ SOLN: 40.0000 mg | SUBCUTANEOUS | Status: DC

## 2014-06-06 NOTE — Progress Notes (Signed)
Pt d/c'd to [facility] for continued rehab/therapy. Pt d/c'd via [facility] transportation, belongings taken with  family. D/c packet information taken by transporters.

## 2014-06-06 NOTE — Progress Notes (Signed)
Physical Therapy Treatment Patient Details Name: [name] [name] MRN: [id number] DOB: 06/06/1927 Today's Date: 06/06/2014    History of Present Illness [name] [name] is a 80 y.o. male with history of atrial fibrillation status post ablation, diabetes mellitus, hypertension and hyperlipidemia had a fall at his house.Patient did not hit his head or lose consciousness and patient was brought to the ER. In the ER workup revealed left hip fracture and also right acetabular fracture.  Pt underwent  Bipolar arthroplasty of the Lt. hip    PT Comments    Pt progressing towards physical therapy goals. Was able to show improvement with static sitting balance, and with +2 assist, was able to perform lateral scoot from bed>chair. Pt states he continues to be "confused and does not know what is real and what isn't". Pt reports this after being incontinent in the bed - as he thought he had a catheter in. Focus of session was to clean pt up and transfer to recliner chair as linens were soiled and wet in the bed.   Follow Up Recommendations  SNF     Equipment Recommendations  None recommended by PT    Recommendations for Other Services       Precautions / Restrictions Precautions Precautions: Fall;Posterior Hip Precaution Booklet Issued: Yes (comment) Precaution Comments: Handout posted in pt's room. Reviewed with pt. He was unable to recall any of his precautions.  Restrictions Weight Bearing Restrictions: Yes RLE Weight Bearing: Non weight bearing LLE Weight Bearing: Weight bearing as tolerated    Mobility  Bed Mobility Overal bed mobility: Needs Assistance Bed Mobility: Rolling;Supine to Sit Rolling: Min assist   Supine to sit: Mod assist;+2 for physical assistance     General bed mobility comments: Pt able to roll R and L with min assist for peri-care. Pt required assistance to bring LE's off EOB as well as elevate trunk to full sitting position.   Transfers Overall transfer level: Needs  assistance Equipment used: None Transfers: Lateral/Scoot Transfers          Lateral/Scoot Transfers: Mod assist;Max assist;+2 physical assistance;+2 safety/equipment;From elevated surface General transfer comment: Step-by-step cueing for hand placement and sequencing during lateral scoot transfers. Assist given bilaterally from the gait belt for large scoots from bed to chair. Pt was cued especially to maintain NWB on the RLE.   Ambulation/Gait             General Gait Details: Deferred. Pt unable to keep weight off RLE and will require maxi-move lift back to bed with nursing staff. Not ready for ambulation.    Stairs            Wheelchair Mobility    Modified Rankin (Stroke Patients Only)       Balance Overall balance assessment: Needs assistance Sitting-balance support: Feet supported;No upper extremity supported Sitting balance-Leahy Scale: Fair Sitting balance - Comments: Pt was able to maintain sitting EOB without assist.                             Cognition Arousal/Alertness: Awake/alert Behavior During Therapy: WFL for tasks assessed/performed Overall Cognitive Status: Impaired/Different from baseline Area of Impairment: Orientation;Attention;Memory;Following commands;Safety/judgement;Awareness;Problem solving Orientation Level: Disoriented to;Time;Situation Current Attention Level: Sustained Memory: Decreased recall of precautions;Decreased short-term memory Following Commands: Follows one step commands consistently Safety/Judgement: Decreased awareness of safety;Decreased awareness of deficits Awareness: Emergent Problem Solving: Slow processing;Decreased initiation;Requires verbal cues;Requires tactile cues      Exercises  General Comments        Pertinent Vitals/Pain Pain Assessment: Faces Faces Pain Scale: Hurts even more Pain Location: B hip and pelvic pain  Pain Intervention(s): Monitored during session;Premedicated  before session;Repositioned    Home Living                      Prior Function            PT Goals (current goals can now be found in the care plan section) Acute Rehab PT Goals Patient Stated Goal: Pt did not state PT Goal Formulation: With patient Time For Goal Achievement: 06/16/14 Potential to Achieve Goals: Good Progress towards PT goals: Progressing toward goals    Frequency  Min 3X/week    PT Plan Current plan remains appropriate    Co-evaluation             End of Session Equipment Utilized During Treatment: Gait belt Activity Tolerance: Patient limited by fatigue;Patient limited by pain Patient left: in chair;with chair alarm set;with call bell/phone within reach     Time: 1314-1341 PT Time Calculation (min): 27 min  Charges:  $Gait Training: 8-22 mins $Therapeutic Activity: 8-22 mins                    G Codes:      Rolinda Roan 06-11-14, 1:58 PM   Rolinda Roan, PT, DPT Acute Rehabilitation Services Pager: [phone]

## 2014-06-06 NOTE — Progress Notes (Signed)
Inpatient Diabetes Program Recommendations  AACE/ADA: New Consensus Statement on Inpatient Glycemic Control (2013)  Target Ranges:  Prepandial:   less than 140 mg/dL      Peak postprandial:   less than 180 mg/dL (1-2 hours)      Critically ill patients:  140 - 180 mg/dL   Results for DOMANIK, RAINVILLE (MRN [id number]) as of 06/06/2014 10:29  Ref. Range 06/05/2014 08:04 06/05/2014 11:24 06/05/2014 16:30 06/05/2014 20:01 06/05/2014 23:56 06/06/2014 07:44  Glucose-Capillary Latest Range: 70-99 mg/dL 195 (H) 276 (H) 145 (H) 239 (H) 191 (H) 222 (H)   Diabetes history: DM2 Outpatient Diabetes medications: Glipizide 20 mg QAM, Metformin XR 1000 mg BID Current orders for Inpatient glycemic control: Novolog 0-9 units AC  Inpatient Diabetes Program Recommendations Insulin - Basal: While inpatient, please consider ordering low dose basal insulin; recommend Levemir 5 units QHS.  Thanks, Barnie Alderman, RN, MSN, CCRN, CDE Diabetes Coordinator Inpatient Diabetes Program [phone] (Team Pager) [phone] (AP office) [phone] [facility] office)

## 2014-06-06 NOTE — Care Management Note (Signed)
CARE MANAGEMENT NOTE 06/06/2014  Patient:  [name] [name],[name] [name]   Account Number:  [id number]  Date Initiated:  05/31/2014  Documentation initiated by:  [facility]  Subjective/Objective Assessment:   mechanical fall, fracture     Action/Plan:   dtrTami [name] [phone]  06/06/14 Pt for d/c to SNF today.   Anticipated DC Date:  06/06/2014   Anticipated DC Plan:  SKILLED NURSING FACILITY  In-house referral  Clinical Social Worker      DC Planning Services  CM consult      Choice offered to / List presented to:             Status of service:  Completed, signed off Medicare Important Message given?  YES (If response is "NO", the following Medicare IM given date fields will be blank) Date Medicare IM given:  06/03/2014 Medicare IM given by:  [facility] Date Additional Medicare IM given:  06/06/2014 Additional Medicare IM given by:  [facility]  Discharge Disposition:  [place]  Per UR Regulation:    If discussed at Long Length of Stay Meetings, dates discussed:    Comments:  06/06/14 Met with pt and grandson, plan is to d/c to SNF today, CSW following, IM given. CRoyal RN MPH, case manager, [phone]  06-03-14 11:44am Luz Lex, RNBSN [phone] Family in room, awaiting to talk with SW about placement. Left message for SW of family desire for meetings and texted SW contact numbers of daughter and husband.  Prior to admission patient lived in separate apt at daughter house.  She states independent.  05/31/2014 1730 NCM spoke to pt's dtr, [name] [name] and son-in-law Vikki Ports [phone], [phone] respectively. They want SNF -rehab at dc. CSW referral made. Jonnie Finner RN CCM Case Mgmt phone [phone]

## 2014-06-06 NOTE — Progress Notes (Signed)
CSW Armed forces technical officer) spoke with pt daughter and provided bed offers again. She is requesting CSW call [facility] and inquire if they can offer a bed before she makes a decision. She also is requesting additional information from [facility]. CSW awaiting response from [facility] and received requested information from [facility]. Will relay to pt daughter.   [place], [place]

## 2014-06-06 NOTE — Plan of Care (Signed)
Problem: Phase III Progression Outcomes Goal: Ambulate BID with assist as able Outcome: Not Progressing Goal: Discharge plan remains appropriate-arrangements made Outcome: Completed/Met Date Met:  06/06/14

## 2014-06-06 NOTE — Progress Notes (Signed)
Pt's daughter, Jana Half contacted at her request per SW-[place]. Daughter needed clarification of when her father was going to be discharged. She states she is waiting for [facility] to return her call to give her the answers she needed to make final decisions on choosing their facility. She will call back to the unit when she has her information and decision.  Will contact SW-[place] and relay this information if she doesn't already have.

## 2014-06-06 NOTE — Care Management Note (Deleted)
CARE MANAGEMENT NOTE 06/06/2014  Patient:  [name] [name]   Account Number:  [id number]  Date Initiated:  06/03/2014  Documentation initiated by:  Dynesha Woolen  Subjective/Objective Assessment:   CM following for progression and d/c planning.     Action/Plan:   06/03/14 Met with pt and IM given, pt plans to d/c back to home, post op.   Anticipated DC Date:  06/07/2014   Anticipated DC Plan:  HOME/SELF CARE         Choice offered to / List presented to:             Status of service:  In process, will continue to follow Medicare Important Message given?  YES (If response is "NO", the following Medicare IM given date fields will be blank) Date Medicare IM given:  06/03/2014 Medicare IM given by:  Raschelle Wisenbaker Date Additional Medicare IM given:  06/06/2014 Additional Medicare IM given by:  [facility]  Discharge Disposition:    Per UR Regulation:    If discussed at Long Length of Stay Meetings, dates discussed:    Comments:    CARE MANAGEMENT NOTE 06/06/2014  Patient:  [name] [name]   Account Number:  [id number]  Date Initiated:  06/03/2014  Documentation initiated by:  Nazire Fruth  Subjective/Objective Assessment:   CM following for progression and d/c planning.     Action/Plan:   06/03/14 Met with pt and IM given, pt plans to d/c back to home, post op.   Anticipated DC Date:  06/07/2014   Anticipated DC Plan:  HOME/SELF CARE         Choice offered to / List presented to:             Status of service:  In process, will continue to follow Medicare Important Message given?  YES (If response is "NO", the following Medicare IM given date fields will be blank) Date Medicare IM given:  06/03/2014 Medicare IM given by:  Tukker Byrns Date Additional Medicare IM given:  06/06/2014 Additional Medicare IM given by:  [facility]  Discharge Disposition:    Per UR Regulation:    If discussed at Long Length of Stay Meetings, dates discussed:    Comments:

## 2014-06-06 NOTE — Plan of Care (Signed)
Problem: Discharge Progression Outcomes Goal: Pain controlled with appropriate interventions Outcome: Completed/Met Date Met:  06/06/14 Goal: Hemodynamically stable Outcome: Completed/Met Date Met:  06/06/14 Goal: Tolerates diet Outcome: Completed/Met Date Met:  06/06/14 Goal: CMS/Neurovascular status at or above baseline Outcome: Completed/Met Date Met:  06/06/14 Goal: Discharge plan in place and appropriate Outcome: Completed/Met Date Met:  06/06/14 Goal: Activity appropriate for discharge plan Outcome: Progressing Pt discharging to SNF for continued rehab.

## 2014-06-06 NOTE — Discharge Summary (Signed)
Physician Discharge Summary  [name] [name] JZP:[id number] DOB: 1926-08-29 DOA: 05/30/2014  PCP: Mathews Argyle, MD  Admit date: 05/30/2014 Discharge date: 06/06/2014  Time spent: 45 minutes  Recommendations for Outpatient Follow-up:  1. Dr.Joshua Landau in 10days 2. Stop lovenox after 3 weeks, monitor platelet count weekly while on lovenox 3. Per Ortho: Weight Bearing: NWB RLE, WBAT LLE  Discharge Diagnoses:  Principal Problem:   Hip fracture, left Active Problems:   Interstitial lung disease   Hyperlipidemia   History of atrial fibrillation   Diabetes mellitus, controlled   Hip fracture   COPD    Elevated troponin   Personal history of other diseases of circulatory system   Pulmonary fibrosis    Discharge Condition: stable  Diet recommendation: diabetic  Filed Weights   05/30/14 1650 06/04/14 2003 06/05/14 2011  Weight: 82.555 kg (182 lb) 82.555 kg (182 lb) 82.555 kg (182 lb)    History of present illness:  Chief Complaint: Fall.  HPI: [name] [name] is a 80 y.o. male with history of atrial fibrillation status post ablation, diabetes mellitus, hypertension and hyperlipidemia had a fall at his house when he was trying to cross the door. Patient did not hit his head or lose consciousness and patient was brought to the ER. In the ER workup revealed left hip fracture and also right acetabular fracture. Dr. Mardelle Matte was consulted from orthopedics. Patient was found to be hypoxic requiring 100% nonrebreather. Patient otherwise denied any chest pain or shortness of breath or any productive cough fever chills nausea vomiting abdominal pain. Patient has known history of interstitial lung disease and since patient was short of breath CT chest was done which was not showing anything acute except for the known interstitial lung disease.   Hospital Course:   Left hip fracture status post mechanical fall  -Despite high cardiac and Pulm risk, tolerated surgery well  -was  seen by Pulm and Cards, this admission and optimized for surgery   -underwent L Hip Arthroplasty and did well post op  -DVT prophylaxis: SQ lovenox for 3 weeks  -Foley removed, Physical therapy ongoing  -SNF for rehab   Abnormal troponin-trending down likely secondary to demand ischemia, cardiology consulted, 2-D echo completed, no wall motion abnormality noted, EF 50-55%, continue ASA/statin, remains on sotalol       -conservative management recommended per Cardiology    Severe COPD/Chronic hypoxemia with interstitial lung disease  -Pulm consult appreciated,   -stable post op, continue nebs PRN   Diabetes mellitus type 2  -stable, continue SSI   Suspected UTI  -Stopped ceftriaxone, Urine Cx negative   History of atrial fibrillation status post ablation presently in sinus rhythm - continue sotalol, not on anticoagulation.   Hyperlipidemia - continue present medications.   Mild anemia -stable, defer need for workup to PCP   Constipation  -resolved  -continue senokot BID and miralax   Confusion/Encephalopathy  -due to meds, COPD etc    -resolved    Procedures: PROCEDURE: 11/4 per Dr.Landau ARTHROPLASTY BIPOLAR HIP  Consultations:  Ortho  Discharge Exam: Filed Vitals:   06/06/14 0934  BP: 125/57  Pulse: 68  Temp: 97.5 F (36.4 C)  Resp: 21    General: AAOx3 Cardiovascular: S1S2/RRR Respiratory: scattered ronchi and diminished BS  Discharge Instructions You were cared for by a hospitalist during your hospital stay. If you have any questions about your discharge medications or the care you received while you were in the hospital after you are discharged, you can call the unit and  asked to speak with the hospitalist on call if the hospitalist that took care of you is not available. Once you are discharged, your primary care physician will handle any further medical  issues. Please note that NO REFILLS for any discharge medications will be authorized once you are discharged, as it is imperative that you return to your primary care physician (or establish a relationship with a primary care physician if you do not have one) for your aftercare needs so that they can reassess your need for medications and monitor your lab values.  Discharge Instructions    Diet Carb Modified    Complete by:  As directed      Increase activity slowly    Complete by:  As directed      Non weight bearing    Complete by:  As directed   Laterality:  right  Extremity:  Lower     Posterior total hip precautions    Complete by:  As directed      Weight bearing as tolerated    Complete by:  As directed   Laterality:  left  Extremity:  Lower          Current Discharge Medication List    START taking these medications   Details  acetaminophen (TYLENOL) 325 MG tablet Take 2 tablets (650 mg total) by mouth every 6 (six) hours as needed. Qty: 60 tablet, Refills: 1    enoxaparin (LOVENOX) 40 MG/0.4ML injection Inject 0.4 mLs (40 mg total) into the skin daily. For 3 weeks Qty: 21 Syringe, Refills: 0    HYDROcodone-acetaminophen (NORCO/VICODIN) 5-325 MG per tablet Take 1 tablet by mouth every 6 (six) hours as needed for severe pain. Qty: 20 tablet, Refills: 0    senna (SENOKOT) 8.6 MG TABS tablet Take 1 tablet (8.6 mg total) by mouth 2 (two) times daily. Qty: 30 each, Refills: 0      CONTINUE these medications which have CHANGED   Details  glipiZIDE (GLUCOTROL XL) 10 MG 24 hr tablet Take 1 tablet (10 mg total) by mouth daily with breakfast.      CONTINUE these medications which have NOT CHANGED   Details  antiseptic oral rinse (BIOTENE) LIQD 15 mLs by Mouth Rinse route as needed for dry mouth.    docusate sodium (COLACE) 100 MG capsule Take 100 mg by mouth 2 (two) times daily as needed for mild constipation.    finasteride (PROSCAR) 5 MG tablet Take 5 mg by mouth  daily.    Glucosamine-Chondroit-Vit C-Mn (GLUCOSAMINE 1500 COMPLEX PO) Take 1,500 mg by mouth 2 (two) times daily.     guaiFENesin (MUCINEX) 600 MG 12 hr tablet Take 600 mg by mouth daily as needed for to loosen phlegm.    metFORMIN (GLUCOPHAGE-XR) 500 MG 24 hr tablet Take 1,000 mg by mouth 2 (two) times daily.     Multiple Vitamins-Minerals (EYE VITAMINS PO) Take 1 tablet by mouth daily. SMOTHERS FORMULA    Omega-3 Fatty Acids (FISH OIL) 1200 MG CAPS Take 1,200 mg by mouth 2 (two) times daily.     rosuvastatin (CRESTOR) 10 MG tablet Take 10 mg by mouth daily.    sotalol (BETAPACE) 80 MG tablet Take 80 mg by mouth 2 (two) times daily.      STOP taking these medications     naproxen sodium (ANAPROX) 220 MG tablet        No Known Allergies    The results of significant diagnostics from this hospitalization (including imaging, microbiology, ancillary  and laboratory) are listed below for reference.    Significant Diagnostic Studies: Dg Chest 2 View  06/03/2014   CLINICAL DATA:  Chest pain and difficulty breathing  EXAM: CHEST  2 VIEW  COMPARISON:  May 31, 2014 and August 26, 2013  FINDINGS: There is chronic interstitial fibrosis in both mid and lower lung zones. There is no frank edema or consolidation. Heart is enlarged with pulmonary vascularity within normal limits. No adenopathy. There is atherosclerotic change in aorta. No adenopathy. No appreciable bone lesions.  IMPRESSION: Chronic interstitial fibrotic type change. No frank edema or consolidation. Stable cardiomegaly. No adenopathy.   Electronically Signed   By: Lowella Grip M.D.   On: 06/03/2014 12:03   Dg Thoracic Spine 2 View  05/30/2014   CLINICAL DATA:  Fall, mid back pain  EXAM: THORACIC SPINE - 2 VIEW  COMPARISON:  None.  FINDINGS: Normal thoracic kyphosis.  No evidence of fracture dislocation. Vertebral body heights are maintained.  Moderate multilevel degenerative changes.  Vascular calcifications.  Visualized  lungs are clear.  IMPRESSION: No fracture or dislocation is seen.  Moderate multilevel degenerative changes.   Electronically Signed   By: Julian Hy M.D.   On: 05/30/2014 19:23   Dg Lumbar Spine Complete  05/30/2014   CLINICAL DATA:  Fall, mid/lower back pain  EXAM: LUMBAR SPINE - COMPLETE 4+ VIEW  COMPARISON:  CT abdomen dated 08/27/2013  FINDINGS: Five lumbar type vertebral bodies.  Normal lumbar lordosis.  No evidence of fracture or dislocation. Vertebral body heights are maintained.  Grade 1 anterolisthesis of L4 on L5.  Moderate multilevel degenerative changes.  Visualized bony pelvis appears intact.  Vascular calcifications with known infrarenal abdominal aortic aneurysm.a  IMPRESSION: No fracture or dislocation is seen.  Moderate degenerative changes.  Grade 1 anterolisthesis of L4 on L5.   Electronically Signed   By: Julian Hy M.D.   On: 05/30/2014 20:01   Dg Shoulder Right  05/30/2014   CLINICAL DATA:  Fall, right shoulder pain  EXAM: RIGHT SHOULDER - 2+ VIEW  COMPARISON:  None.  FINDINGS: No fracture or dislocation is seen.  Moderate degenerative changes of the glenohumeral joint and acromioclavicular joint  The visualized soft tissues are unremarkable.  Visualized right lung is clear.  IMPRESSION: No fracture or dislocation is seen.  Moderate degenerative changes.   Electronically Signed   By: Julian Hy M.D.   On: 05/30/2014 19:22   Dg Hip Complete Left  05/30/2014   CLINICAL DATA:  Left hip pain after falling at home today at 1500 hr.  EXAM: LEFT HIP - COMPLETE 2+ VIEW  COMPARISON:  Portable pelvis obtained earlier today.  FINDINGS: Left femoral neck fracture with mild valgus angulation. Atheromatous arterial calcifications.  IMPRESSION: Previously noted mildly displaced left femoral neck fracture with mild valgus angulation.   Electronically Signed   By: Enrique Sack M.D.   On: 05/30/2014 19:21   Ct Head Wo Contrast  05/30/2014   CLINICAL DATA:  Right neck pain after  falling today and hitting his shoulder against a door jamb.  EXAM: CT HEAD WITHOUT CONTRAST  CT CERVICAL SPINE WITHOUT CONTRAST  TECHNIQUE: Multidetector CT imaging of the head and cervical spine was performed following the standard protocol without intravenous contrast. Multiplanar CT image reconstructions of the cervical spine were also generated.  COMPARISON:  10/01/2013.  FINDINGS: CT HEAD FINDINGS  Diffusely enlarged ventricles and subarachnoid spaces. Patchy white matter low density in both cerebral hemispheres. No skull fracture, intracranial hemorrhage or  paranasal sinus air-fluid levels. Dense arterial calcifications at the skull base.  CT CERVICAL SPINE FINDINGS  Extensive multilevel cervical spine degenerative changes. No prevertebral soft tissue swelling, fractures or subluxations. Bilateral vertebral and carotid artery atheromatous calcifications. Bullous changes at both lung apices.  IMPRESSION: 1. No skull fracture or intracranial hemorrhage. 2. No cervical spine fracture or subluxation. 3. Stable mid atrophy and chronic small vessel white matter ischemic changes. 4. Stable marked cervical spine degenerative changes. 5. COPD.   Electronically Signed   By: Enrique Sack M.D.   On: 05/30/2014 19:07   Ct Chest W Contrast  05/30/2014   CLINICAL DATA:  [place] today. Complaining of right shoulder and left hip pain.  EXAM: CT CHEST, ABDOMEN, AND PELVIS WITH CONTRAST  TECHNIQUE: Multidetector CT imaging of the chest, abdomen and pelvis was performed following the standard protocol during bolus administration of intravenous contrast.  CONTRAST:  169mL OMNIPAQUE IOHEXOL 300 MG/ML  SOLN  COMPARISON:  Chest CT, 08/30/2013.  FINDINGS: CT CHEST FINDINGS  Heart is mildly enlarged. There are dense coronary artery and mitral valve annular calcifications. Aorta is normal in caliber. There is partly calcified mild plaque along the thoracic aorta and at the origin of the left subclavian artery. No significant stenosis.   Right left pulmonary arteries are dilated to 3 cm, stable.  Borderline enlarged right peritracheal lymph node measures 1 cm short axis, stable. No mediastinal masses. No hilar masses or adenopathy.  Lungs show heterogeneous coarse peripheral reticular opacities consistent with interstitial fibrosis, with a pattern suggesting UIP. Focal opacity noted in the left lower lobe on the prior exam measuring 15 mm x 7 mm, is without significant change. No acute lung consolidation or evidence of edema. No pleural effusion or pneumothorax.  CT ABDOMEN AND PELVIS FINDINGS  Liver shows morphologic changes consistent with cirrhosis. No liver mass or focal lesion. No contusion or laceration.  Mean is prominent measuring 12.4 cm in greatest dimension. No splenic mass or focal lesion.  Gallbladder is unremarkable. No bile duct dilation. No pancreatic masses or inflammation. No adrenal masses.  Mild bilateral renal cortical thinning. There is a peripherally calcified hypo attenuating, 3.1 cm mass protruding from the inferior margin of the left kidney, not imaged on the prior chest CT. This is likely a mildly complicated cyst. There is a low attenuation lesion in the upper pole left kidney, most likely a simple cyst. No other renal masses or lesions. No hydronephrosis. Normal ureters. Bladder is unremarkable.  There is an infrarenal abdominal aortic aneurysm measuring 3.7 cm in greatest anterior-posterior dimension. No evidence of rupture. Atherosclerotic changes noted along the abdominal aorta and its branch vessels.  No pathologically enlarged lymph nodes. No abnormal fluid collections. Specifically no evidence of hemo peritoneum.  No bowel wall thickening to suggest a bowel hematoma. No bowel inflammatory changes. No mesenteric inflammation or contusion/hematoma.  MUSCULOSKELETAL:  There fractures of the right acetabulum. Fracture extends across the posterior column, extending across the medial acetabulum to the anterior,. Fracture  is mildly distracted by 7 mm. There is mild fracture comminution with small fracture fragments noted between the major fracture components, at least 1 within the hip joint.  There is a fracture of the left femoral neck. This is subcapital, oblique and non comminuted. Fracture is displaced with the distal fracture component migrating superiorly by 17 mm. There is also mild varus angulation as well as significant apex anterior angulation.  No other pelvic or proximal femur fractures.  There are no other fractures.  IMPRESSION: 1. Fractures of the right acetabulum involving the anterior posterior columns. 2. Fracture of the left femoral neck, mildly displaced and angulated. 3. No other fractures. 4. No other acute findings in the chest, abdomen or pelvis. No other evidence of acute injury. 5. Chronic findings include cardiomegaly, coronary artery calcifications and changes consistent with interstitial fibrosis. Pulmonary artery dilation suggest pulmonary artery hypertension. Below the diaphragm, there changes of cirrhosis with borderline splenomegaly. Probable left renal cysts. 6. 3.7 cm infrarenal abdominal aortic aneurysm. No evidence of rupture.   Electronically Signed   By: Lajean Manes M.D.   On: 05/30/2014 22:06   Ct Cervical Spine Wo Contrast  05/30/2014   CLINICAL DATA:  Right neck pain after falling today and hitting his shoulder against a door jamb.  EXAM: CT HEAD WITHOUT CONTRAST  CT CERVICAL SPINE WITHOUT CONTRAST  TECHNIQUE: Multidetector CT imaging of the head and cervical spine was performed following the standard protocol without intravenous contrast. Multiplanar CT image reconstructions of the cervical spine were also generated.  COMPARISON:  10/01/2013.  FINDINGS: CT HEAD FINDINGS  Diffusely enlarged ventricles and subarachnoid spaces. Patchy white matter low density in both cerebral hemispheres. No skull fracture, intracranial hemorrhage or paranasal sinus air-fluid levels. Dense arterial  calcifications at the skull base.  CT CERVICAL SPINE FINDINGS  Extensive multilevel cervical spine degenerative changes. No prevertebral soft tissue swelling, fractures or subluxations. Bilateral vertebral and carotid artery atheromatous calcifications. Bullous changes at both lung apices.  IMPRESSION: 1. No skull fracture or intracranial hemorrhage. 2. No cervical spine fracture or subluxation. 3. Stable mid atrophy and chronic small vessel white matter ischemic changes. 4. Stable marked cervical spine degenerative changes. 5. COPD.   Electronically Signed   By: Enrique Sack M.D.   On: 05/30/2014 19:07   Ct Abdomen Pelvis W Contrast  05/30/2014   CLINICAL DATA:  [place] today. Complaining of right shoulder and left hip pain.  EXAM: CT CHEST, ABDOMEN, AND PELVIS WITH CONTRAST  TECHNIQUE: Multidetector CT imaging of the chest, abdomen and pelvis was performed following the standard protocol during bolus administration of intravenous contrast.  CONTRAST:  197mL OMNIPAQUE IOHEXOL 300 MG/ML  SOLN  COMPARISON:  Chest CT, 08/30/2013.  FINDINGS: CT CHEST FINDINGS  Heart is mildly enlarged. There are dense coronary artery and mitral valve annular calcifications. Aorta is normal in caliber. There is partly calcified mild plaque along the thoracic aorta and at the origin of the left subclavian artery. No significant stenosis.  Right left pulmonary arteries are dilated to 3 cm, stable.  Borderline enlarged right peritracheal lymph node measures 1 cm short axis, stable. No mediastinal masses. No hilar masses or adenopathy.  Lungs show heterogeneous coarse peripheral reticular opacities consistent with interstitial fibrosis, with a pattern suggesting UIP. Focal opacity noted in the left lower lobe on the prior exam measuring 15 mm x 7 mm, is without significant change. No acute lung consolidation or evidence of edema. No pleural effusion or pneumothorax.  CT ABDOMEN AND PELVIS FINDINGS  Liver shows morphologic changes consistent  with cirrhosis. No liver mass or focal lesion. No contusion or laceration.  Mean is prominent measuring 12.4 cm in greatest dimension. No splenic mass or focal lesion.  Gallbladder is unremarkable. No bile duct dilation. No pancreatic masses or inflammation. No adrenal masses.  Mild bilateral renal cortical thinning. There is a peripherally calcified hypo attenuating, 3.1 cm mass protruding from the inferior margin of the left kidney, not imaged on the prior chest CT. This is  likely a mildly complicated cyst. There is a low attenuation lesion in the upper pole left kidney, most likely a simple cyst. No other renal masses or lesions. No hydronephrosis. Normal ureters. Bladder is unremarkable.  There is an infrarenal abdominal aortic aneurysm measuring 3.7 cm in greatest anterior-posterior dimension. No evidence of rupture. Atherosclerotic changes noted along the abdominal aorta and its branch vessels.  No pathologically enlarged lymph nodes. No abnormal fluid collections. Specifically no evidence of hemo peritoneum.  No bowel wall thickening to suggest a bowel hematoma. No bowel inflammatory changes. No mesenteric inflammation or contusion/hematoma.  MUSCULOSKELETAL:  There fractures of the right acetabulum. Fracture extends across the posterior column, extending across the medial acetabulum to the anterior,. Fracture is mildly distracted by 7 mm. There is mild fracture comminution with small fracture fragments noted between the major fracture components, at least 1 within the hip joint.  There is a fracture of the left femoral neck. This is subcapital, oblique and non comminuted. Fracture is displaced with the distal fracture component migrating superiorly by 17 mm. There is also mild varus angulation as well as significant apex anterior angulation.  No other pelvic or proximal femur fractures.  There are no other fractures.  IMPRESSION: 1. Fractures of the right acetabulum involving the anterior posterior columns.  2. Fracture of the left femoral neck, mildly displaced and angulated. 3. No other fractures. 4. No other acute findings in the chest, abdomen or pelvis. No other evidence of acute injury. 5. Chronic findings include cardiomegaly, coronary artery calcifications and changes consistent with interstitial fibrosis. Pulmonary artery dilation suggest pulmonary artery hypertension. Below the diaphragm, there changes of cirrhosis with borderline splenomegaly. Probable left renal cysts. 6. 3.7 cm infrarenal abdominal aortic aneurysm. No evidence of rupture.   Electronically Signed   By: Lajean Manes M.D.   On: 05/30/2014 22:06   Pelvis Portable  06/01/2014   CLINICAL DATA:  Postop left total hip.  EXAM: PORTABLE PELVIS 1-2 VIEWS  COMPARISON:  05/31/2014  FINDINGS: Patient has undergone total hip arthroplasty. Left hip prosthesis appears well seated. No evidence for dislocation on this frontal view. There is asymmetry of the SI joint, likely related to pelvic fractures. Right acetabular fracture again noted. There is deformity of the right inferior pubic ramus.  IMPRESSION: 1. Status post left total hip arthroplasty. 2. No evidence for dislocation on this frontal view.   Electronically Signed   By: Shon Hale M.D.   On: 06/01/2014 17:07   Dg Pelvis Portable  05/30/2014   CLINICAL DATA:  [place] today at 3 o'clock. Tripped over a door way. Left hip pain.  EXAM: PORTABLE PELVIS 1-2 VIEWS PORTABLE ONE-VIEW CHEST X-RAY.  COMPARISON:  HIP RADIOGRAPHS 10/01/2013 AND CHEST X-RAY 08/26/2013.  FINDINGS: CHEST X-RAY:  The heart is mildly enlarged but stable. There is tortuosity and calcification of the thoracic aorta. Chronic lung changes with pulmonary fibrosis. No definite acute overlying pulmonary process. The bony thorax is grossly intact.  Pelvis:  There is a displaced left femoral neck fracture. The right hip is intact. Moderate degenerative changes bilaterally, right greater than left. The pubic symphysis and SI joints are  intact. No pelvic fractures. Extensive vascular calcifications are noted.  IMPRESSION: 1. Displaced left femoral neck fracture. 2. No acute cardiopulmonary findings. Chronic lung changes/pulmonary fibrosis.   Electronically Signed   By: Kalman Jewels M.D.   On: 05/30/2014 17:36   Dg Pelvis Comp Min 3v  05/31/2014   CLINICAL DATA:  Right acetabular fracture  EXAM: JUDET PELVIS - 3+ VIEW  COMPARISON:  05/30/2014  FINDINGS: Again noted changes consistent with a left femoral neck fracture with impaction. The right acetabular fracture is again noted and unchanged. No right femoral abnormality seen.  IMPRESSION: Right acetabular fracture stable from the prior exam.  Left femoral neck fracture stable from the prior exam.   Electronically Signed   By: Inez Catalina M.D.   On: 05/31/2014 11:34   Ct 3d Recon At Scanner  05/31/2014   CLINICAL DATA:  Nonspecific (abnormal) findings on radiological and other examination of musculoskeletal system. Right acetabular fracture. Left femoral neck fracture.  EXAM: 3-DIMENSIONAL CT IMAGE RENDERING ON ACQUISITION WORKSTATION  TECHNIQUE: 3-dimensional CT images were rendered by post-processing of the original CT data on an acquisition workstation.  COMPARISON:  CT scan and radiographs dated 05/30/2014  FINDINGS: There is a oblique vertical fracture through the medial aspect of the right acetabulum the with approximately 8 mm of distraction. There is also a subtle old healed fracture of the right inferior pubic ramus. I do not think these are acute.  There is an acute subcapital fracture of the left femoral neck with impaction and angulation. There are slight arthritic changes of the superior lateral aspect of the left acetabulum.  The 3D images better demonstrate the right acetabular fracture.  IMPRESSION: Slightly distracted subacute fracture through the medial aspect of the right acetabulum. Old healed fracture of the low right inferior pubic ramus.   Electronically Signed   By:  Rozetta Nunnery M.D.   On: 05/31/2014 11:37   Dg Chest Port 1 View  05/31/2014   CLINICAL DATA:  80 year old male with low oxygen level. Subsequent encounter.  EXAM: PORTABLE CHEST - 1 VIEW  COMPARISON:  05/30/2014 and 08/26/2013.  FINDINGS: Pulmonary vascular congestion superimposed upon chronic lung changes.  Elevated left hemidiaphragm with left base subsegmental atelectasis.  No gross pneumothorax.  Cardiomegaly.  Calcified mildly tortuous aorta.  IMPRESSION: Pulmonary vascular congestion superimposed upon chronic lung changes.  Elevated left hemidiaphragm with left base subsegmental atelectasis.  No gross pneumothorax.  Cardiomegaly.  Calcified mildly tortuous aorta.   Electronically Signed   By: Chauncey Cruel M.D.   On: 05/31/2014 06:55   Dg Chest Port 1 View  05/30/2014   CLINICAL DATA:  80 year old male who fell at 1500 hr today tripping over door way. Acute pain. Initial encounter.  EXAM: PORTABLE CHEST - 1 VIEW  COMPARISON:  Chest CT 08/30/2013 and earlier.  FINDINGS: Portable AP semi upright view at 1716 hr. Lower lung volumes. Stable cardiomegaly and mediastinal contours. Peripheral increased opacity greater in the left lung is stable. No pneumothorax, pulmonary edema, or pleural effusion identified. No acute pulmonary opacity identified. Calcified atherosclerosis of the aorta. Visualized tracheal air column is within normal limits. No acute osseous abnormality identified.  IMPRESSION: Cardiomegaly and chronic lung disease. No superimposed acute findings are identified.   Electronically Signed   By: Lars Pinks M.D.   On: 05/30/2014 17:35    Microbiology: Recent Results (from the past 240 hour(s))  Surgical pcr screen     Status: Abnormal   Collection Time: 05/31/14  1:33 AM  Result Value Ref Range Status   MRSA, PCR NEGATIVE NEGATIVE Final   Staphylococcus aureus POSITIVE (A) NEGATIVE Final    Comment:        The Xpert SA Assay (FDA approved for NASAL specimens in patients over 21 years of  age), is one component of a comprehensive surveillance program.  Test performance  has been validated by [employer] for patients greater than or equal to 36 year old. It is not intended to diagnose infection nor to guide or monitor treatment. RESULT CALLED TO, READ BACK BY AND VERIFIED WITH: A WEATHERFORD,RN 05/31/14 0400 BY RHOLMES   Urine culture     Status: None   Collection Time: 05/31/14  2:06 AM  Result Value Ref Range Status   Specimen Description URINE, CATHETERIZED  Final   Special Requests NONE  Final   Culture  Setup Time   Final    05/31/2014 10:06 Performed at [place] Performed at [facility]   Final   Culture NO GROWTH Performed at [facility]   Final   Report Status 06/01/2014 FINAL  Final  Culture, Urine     Status: None   Collection Time: 05/31/14  9:21 PM  Result Value Ref Range Status   Specimen Description URINE, RANDOM  Final   Special Requests ADDED [id number]  Final   Culture  Setup Time   Final    06/01/2014 10:17 Performed at [place] Performed at [facility]   Final   Culture NO GROWTH Performed at [facility]   Final   Report Status 06/02/2014 FINAL  Final     Labs: Basic Metabolic Panel:  Recent Labs Lab 06/02/14 0319 06/03/14 0200 06/04/14 0450 06/05/14 0753 06/06/14 0630  NA 141 139 138 141 140  K 4.9 4.5 4.1 4.1 4.2  CL 106 102 100 103 102  CO2 21 22 24 30 27   GLUCOSE 181* 196* 168* 202* 221*  BUN 30* 30* 31* 27* 22  CREATININE 1.10 1.00 0.93 0.87 0.81  CALCIUM 8.9 9.1 8.8 8.7 8.6   Liver Function Tests:  Recent Labs Lab 05/31/14 0315 06/01/14 0259  AST 17 24  ALT 12 12  ALKPHOS 94 84  BILITOT 0.4 0.5  PROT 6.9 6.7  ALBUMIN 3.3* 3.1*   No results for input(s): LIPASE, AMYLASE in the last 168 hours.  Recent Labs Lab 06/01/14 0304  AMMONIA 22   CBC:  Recent Labs Lab 05/30/14 1701  05/31/14 0315  06/02/14 0319 06/03/14 0200 06/04/14 0450 06/05/14 0753 06/06/14 0630  WBC 9.6 9.9  < > 7.7 9.3 7.8 6.8 6.3  NEUTROABS 7.9* 8.5*  --   --   --   --   --   --   HGB 12.4* 12.1*  < > 10.1* 10.3* 9.6* 9.9* 10.0*  HCT 37.9* 37.4*  < > 31.5* 31.6* 29.5* 30.3* 30.8*  MCV 89.4 89.5  < > 89.2 88.8 88.6 88.9 90.6  PLT 156 151  < > 140* 161 164 180 188  < > = values in this interval not displayed. Cardiac Enzymes:  Recent Labs Lab 05/30/14 1701 05/31/14 0315 05/31/14 0615 05/31/14 1235 05/31/14 1649  TROPONINI <0.30 0.87* 0.75* 0.51* 0.52*   BNP: BNP (last 3 results)  Recent Labs  05/31/14 0315  PROBNP 7931.0*   CBG:  Recent Labs Lab 06/05/14 1124 06/05/14 1630 06/05/14 2001 06/05/14 2356 06/06/14 0744  GLUCAP 276* 145* 239* 191* 222*       Signed:  Joannie Medine  [facility] 06/06/2014, 10:56 AM

## 2014-06-06 NOTE — Plan of Care (Signed)
Problem: Discharge Progression Outcomes Goal: Incision without S/S infection Outcome: Completed/Met Date Met:  06/06/14

## 2014-06-06 NOTE — Plan of Care (Signed)
Problem: Discharge Progression Outcomes Goal: Follows weight - bearing limitations Outcome: Not Progressing Goal: ADLs with assistance Outcome: Not Progressing

## 2014-06-06 NOTE — Progress Notes (Signed)
CSW (Clinical Social Worker) prepared pt dc packet and placed with shadow chart. CSW arranged non-emergent ambulance transport. Pt, pt family, pt nurse, and facility informed. CSW signing off.  Osmar Howton, LCSWA [phone]  

## 2014-06-06 NOTE — Plan of Care (Signed)
Problem: Discharge Progression Outcomes Goal: Barriers To Progression Addressed/Resolved Outcome: Not Applicable Date Met:  44/45/84 Goal: Complications resolved/controlled Outcome: Not Applicable Date Met:  83/50/75 Goal: Ambulates safely using assistive device Outcome: Not Applicable Date Met:  73/22/56 Goal: Follows weight - bearing limitations Outcome: Not Progressing Goal: ADLs with assistance Outcome: Not Progressing

## 2014-06-07 ENCOUNTER — Non-Acute Institutional Stay (SKILLED_NURSING_FACILITY): Payer: Medicare Other | Admitting: Adult Health

## 2014-06-07 ENCOUNTER — Encounter: Payer: Self-pay | Admitting: Adult Health

## 2014-06-07 DIAGNOSIS — K5900 Constipation, unspecified: Secondary | ICD-10-CM

## 2014-06-07 DIAGNOSIS — D62 Acute posthemorrhagic anemia: Secondary | ICD-10-CM

## 2014-06-07 DIAGNOSIS — E785 Hyperlipidemia, unspecified: Secondary | ICD-10-CM

## 2014-06-07 DIAGNOSIS — S72002A Fracture of unspecified part of neck of left femur, initial encounter for closed fracture: Secondary | ICD-10-CM

## 2014-06-07 DIAGNOSIS — Z8679 Personal history of other diseases of the circulatory system: Secondary | ICD-10-CM

## 2014-06-07 DIAGNOSIS — E119 Type 2 diabetes mellitus without complications: Secondary | ICD-10-CM

## 2014-06-07 DIAGNOSIS — N400 Enlarged prostate without lower urinary tract symptoms: Secondary | ICD-10-CM

## 2014-06-07 NOTE — Progress Notes (Signed)
Patient ID: [name] [name], male   DOB: [birth year], 80 y.o.   MRN: [id number]   06/07/2014  Facility:  [facility] Home Location:  [place] Room Number: 093-G LEVEL OF CARE:  SNF (31)   Chief Complaint  Patient presents with  . Hospitalization Follow-up    Hip fracture S/P left hip bipolar arthroplasty, Diabetes Mellitus, BPH, History of Atrial Fibrillation, Hyperlipidemia, Anemia and Constipation    HISTORY OF PRESENT ILLNESS:  This is an 80 year old male who had a fall and sustained a left hip fracture and had a recent left hip bipolar arthroplasty. He has been admitted for a short-term rehabilitation.   REASSESSMENT OF ONGOING PROBLEMS:  HX of ATRIAL FIBRILLATION: the patients atrial fibrillation remains stable.  The patient denies DOE, tachycardia, orthopnea, transient neurological sx, pedal edema, palpitations, & PNDs.  No complications noted from the medications currently being used.  BPH: The patient's BPH remains stable. Patient denies urinary hesitancy or dribbling. No complications reported from the current medications being used.   ANEMIA: The anemia has been stable. The patient denies fatigue, melena or hematochezia. No complications from the medications currently being used. 11/15 hgb 10.0  PAST MEDICAL HISTORY:  Past Medical History  Diagnosis Date  . Diabetes   . Atrial fibrillation   . Gynecomastia   . RBBB   . High cholesterol   . Hiatal hernia   . AAA (abdominal aortic aneurysm)   . Heart murmur   . Neuropathic impotence   . Neuropathy   . Macular degeneration, wet   . Pulmonary fibrosis   . Liver disease   . Left displaced femoral neck fracture 05/30/2014  . Elevated troponin     CURRENT MEDICATIONS: Reviewed per MAR/see medication list  No Known Allergies   REVIEW OF SYSTEMS:  GENERAL: no change in appetite, no fatigue, no weight changes, no fever, chills or weakness RESPIRATORY: no cough, SOB, DOE, wheezing,  hemoptysis CARDIAC: no chest pain, edema or palpitations GI: no abdominal pain, diarrhea, heart burn, nausea or vomiting, +constipation   PHYSICAL EXAMINATION  GENERAL: no acute distress, normal body habitus SKIN:  Left hip surgical incision is dry, no erythema EYES: conjunctivae normal, sclerae normal, normal eye lids NECK: supple, trachea midline, no neck masses, no thyroid tenderness, no thyromegaly LYMPHATICS: no LAN in the neck, no supraclavicular LAN RESPIRATORY: breathing is even & unlabored, BS CTAB CARDIAC: RRR, no murmur,no extra heart sounds, no edema GI: abdomen soft, normal BS, no masses, no tenderness, no hepatomegaly, no splenomegaly EXTREMITIES:  Able to move all 4 extremities PSYCHIATRIC: the patient is alert & oriented to person, affect & behavior appropriate  LABS/RADIOLOGY: Labs reviewed: Basic Metabolic Panel:  Recent Labs  06/04/14 0450 06/05/14 0753 06/06/14 0630  NA 138 141 140  K 4.1 4.1 4.2  CL 100 103 102  CO2 24 30 27   GLUCOSE 168* 202* 221*  BUN 31* 27* 22  CREATININE 0.93 0.87 0.81  CALCIUM 8.8 8.7 8.6   Liver Function Tests:  Recent Labs  05/31/14 0315 06/01/14 0259  AST 17 24  ALT 12 12  ALKPHOS 94 84  BILITOT 0.4 0.5  PROT 6.9 6.7  ALBUMIN 3.3* 3.1*    CBC:  Recent Labs  10/01/13 1222 05/30/14 1701 05/31/14 0315  06/04/14 0450 06/05/14 0753 06/06/14 0630  WBC 6.1 9.6 9.9  < > 7.8 6.8 6.3  NEUTROABS 3.9 7.9* 8.5*  --   --   --   --   HGB 12.2*  12.4* 12.1*  < > 9.6* 9.9* 10.0*  HCT 35.2* 37.9* 37.4*  < > 29.5* 30.3* 30.8*  MCV 88.4 89.4 89.5  < > 88.6 88.9 90.6  PLT 117* 156 151  < > 164 180 188  < > = values in this interval not displayed.  Cardiac Enzymes:  Recent Labs  09/27/13 1513  05/31/14 0615 05/31/14 1235 05/31/14 1649  CKTOTAL 28  --   --   --   --   CKMB 1.1  --   --   --   --   TROPONINI  --   < > 0.75* 0.51* 0.52*  < > = values in this interval not displayed. BNP: Invalid input(s):  POCBNP CBG:  Recent Labs  06/05/14 2356 06/06/14 0744 06/06/14 1152  GLUCAP 191* 222* 266*    Dg Chest 2 View  06/03/2014   CLINICAL DATA:  Chest pain and difficulty breathing  EXAM: CHEST  2 VIEW  COMPARISON:  May 31, 2014 and August 26, 2013  FINDINGS: There is chronic interstitial fibrosis in both mid and lower lung zones. There is no frank edema or consolidation. Heart is enlarged with pulmonary vascularity within normal limits. No adenopathy. There is atherosclerotic change in aorta. No adenopathy. No appreciable bone lesions.  IMPRESSION: Chronic interstitial fibrotic type change. No frank edema or consolidation. Stable cardiomegaly. No adenopathy.   Electronically Signed   By: Lowella Grip M.D.   On: 06/03/2014 12:03   Dg Thoracic Spine 2 View  05/30/2014   CLINICAL DATA:  Fall, mid back pain  EXAM: THORACIC SPINE - 2 VIEW  COMPARISON:  None.  FINDINGS: Normal thoracic kyphosis.  No evidence of fracture dislocation. Vertebral body heights are maintained.  Moderate multilevel degenerative changes.  Vascular calcifications.  Visualized lungs are clear.  IMPRESSION: No fracture or dislocation is seen.  Moderate multilevel degenerative changes.   Electronically Signed   By: Julian Hy M.D.   On: 05/30/2014 19:23   Dg Lumbar Spine Complete  05/30/2014   CLINICAL DATA:  Fall, mid/lower back pain  EXAM: LUMBAR SPINE - COMPLETE 4+ VIEW  COMPARISON:  CT abdomen dated 08/27/2013  FINDINGS: Five lumbar type vertebral bodies.  Normal lumbar lordosis.  No evidence of fracture or dislocation. Vertebral body heights are maintained.  Grade 1 anterolisthesis of L4 on L5.  Moderate multilevel degenerative changes.  Visualized bony pelvis appears intact.  Vascular calcifications with known infrarenal abdominal aortic aneurysm.a  IMPRESSION: No fracture or dislocation is seen.  Moderate degenerative changes.  Grade 1 anterolisthesis of L4 on L5.   Electronically Signed   By: Julian Hy  M.D.   On: 05/30/2014 20:01   Dg Shoulder Right  05/30/2014   CLINICAL DATA:  Fall, right shoulder pain  EXAM: RIGHT SHOULDER - 2+ VIEW  COMPARISON:  None.  FINDINGS: No fracture or dislocation is seen.  Moderate degenerative changes of the glenohumeral joint and acromioclavicular joint  The visualized soft tissues are unremarkable.  Visualized right lung is clear.  IMPRESSION: No fracture or dislocation is seen.  Moderate degenerative changes.   Electronically Signed   By: Julian Hy M.D.   On: 05/30/2014 19:22   Dg Hip Complete Left  05/30/2014   CLINICAL DATA:  Left hip pain after falling at home today at 1500 hr.  EXAM: LEFT HIP - COMPLETE 2+ VIEW  COMPARISON:  Portable pelvis obtained earlier today.  FINDINGS: Left femoral neck fracture with mild valgus angulation. Atheromatous arterial calcifications.  IMPRESSION: Previously  noted mildly displaced left femoral neck fracture with mild valgus angulation.   Electronically Signed   By: Enrique Sack M.D.   On: 05/30/2014 19:21   Ct Head Wo Contrast  05/30/2014   CLINICAL DATA:  Right neck pain after falling today and hitting his shoulder against a door jamb.  EXAM: CT HEAD WITHOUT CONTRAST  CT CERVICAL SPINE WITHOUT CONTRAST  TECHNIQUE: Multidetector CT imaging of the head and cervical spine was performed following the standard protocol without intravenous contrast. Multiplanar CT image reconstructions of the cervical spine were also generated.  COMPARISON:  10/01/2013.  FINDINGS: CT HEAD FINDINGS  Diffusely enlarged ventricles and subarachnoid spaces. Patchy white matter low density in both cerebral hemispheres. No skull fracture, intracranial hemorrhage or paranasal sinus air-fluid levels. Dense arterial calcifications at the skull base.  CT CERVICAL SPINE FINDINGS  Extensive multilevel cervical spine degenerative changes. No prevertebral soft tissue swelling, fractures or subluxations. Bilateral vertebral and carotid artery atheromatous  calcifications. Bullous changes at both lung apices.  IMPRESSION: 1. No skull fracture or intracranial hemorrhage. 2. No cervical spine fracture or subluxation. 3. Stable mid atrophy and chronic small vessel white matter ischemic changes. 4. Stable marked cervical spine degenerative changes. 5. COPD.   Electronically Signed   By: Enrique Sack M.D.   On: 05/30/2014 19:07   Ct Chest W Contrast  05/30/2014   CLINICAL DATA:  [place] today. Complaining of right shoulder and left hip pain.  EXAM: CT CHEST, ABDOMEN, AND PELVIS WITH CONTRAST  TECHNIQUE: Multidetector CT imaging of the chest, abdomen and pelvis was performed following the standard protocol during bolus administration of intravenous contrast.  CONTRAST:  171mL OMNIPAQUE IOHEXOL 300 MG/ML  SOLN  COMPARISON:  Chest CT, 08/30/2013.  FINDINGS: CT CHEST FINDINGS  Heart is mildly enlarged. There are dense coronary artery and mitral valve annular calcifications. Aorta is normal in caliber. There is partly calcified mild plaque along the thoracic aorta and at the origin of the left subclavian artery. No significant stenosis.  Right left pulmonary arteries are dilated to 3 cm, stable.  Borderline enlarged right peritracheal lymph node measures 1 cm short axis, stable. No mediastinal masses. No hilar masses or adenopathy.  Lungs show heterogeneous coarse peripheral reticular opacities consistent with interstitial fibrosis, with a pattern suggesting UIP. Focal opacity noted in the left lower lobe on the prior exam measuring 15 mm x 7 mm, is without significant change. No acute lung consolidation or evidence of edema. No pleural effusion or pneumothorax.  CT ABDOMEN AND PELVIS FINDINGS  Liver shows morphologic changes consistent with cirrhosis. No liver mass or focal lesion. No contusion or laceration.  Mean is prominent measuring 12.4 cm in greatest dimension. No splenic mass or focal lesion.  Gallbladder is unremarkable. No bile duct dilation. No pancreatic masses or  inflammation. No adrenal masses.  Mild bilateral renal cortical thinning. There is a peripherally calcified hypo attenuating, 3.1 cm mass protruding from the inferior margin of the left kidney, not imaged on the prior chest CT. This is likely a mildly complicated cyst. There is a low attenuation lesion in the upper pole left kidney, most likely a simple cyst. No other renal masses or lesions. No hydronephrosis. Normal ureters. Bladder is unremarkable.  There is an infrarenal abdominal aortic aneurysm measuring 3.7 cm in greatest anterior-posterior dimension. No evidence of rupture. Atherosclerotic changes noted along the abdominal aorta and its branch vessels.  No pathologically enlarged lymph nodes. No abnormal fluid collections. Specifically no evidence of hemo peritoneum.  No bowel wall thickening to suggest a bowel hematoma. No bowel inflammatory changes. No mesenteric inflammation or contusion/hematoma.  MUSCULOSKELETAL:  There fractures of the right acetabulum. Fracture extends across the posterior column, extending across the medial acetabulum to the anterior,. Fracture is mildly distracted by 7 mm. There is mild fracture comminution with small fracture fragments noted between the major fracture components, at least 1 within the hip joint.  There is a fracture of the left femoral neck. This is subcapital, oblique and non comminuted. Fracture is displaced with the distal fracture component migrating superiorly by 17 mm. There is also mild varus angulation as well as significant apex anterior angulation.  No other pelvic or proximal femur fractures.  There are no other fractures.  IMPRESSION: 1. Fractures of the right acetabulum involving the anterior posterior columns. 2. Fracture of the left femoral neck, mildly displaced and angulated. 3. No other fractures. 4. No other acute findings in the chest, abdomen or pelvis. No other evidence of acute injury. 5. Chronic findings include cardiomegaly, coronary artery  calcifications and changes consistent with interstitial fibrosis. Pulmonary artery dilation suggest pulmonary artery hypertension. Below the diaphragm, there changes of cirrhosis with borderline splenomegaly. Probable left renal cysts. 6. 3.7 cm infrarenal abdominal aortic aneurysm. No evidence of rupture.   Electronically Signed   By: Lajean Manes M.D.   On: 05/30/2014 22:06   Ct Cervical Spine Wo Contrast  05/30/2014   CLINICAL DATA:  Right neck pain after falling today and hitting his shoulder against a door jamb.  EXAM: CT HEAD WITHOUT CONTRAST  CT CERVICAL SPINE WITHOUT CONTRAST  TECHNIQUE: Multidetector CT imaging of the head and cervical spine was performed following the standard protocol without intravenous contrast. Multiplanar CT image reconstructions of the cervical spine were also generated.  COMPARISON:  10/01/2013.  FINDINGS: CT HEAD FINDINGS  Diffusely enlarged ventricles and subarachnoid spaces. Patchy white matter low density in both cerebral hemispheres. No skull fracture, intracranial hemorrhage or paranasal sinus air-fluid levels. Dense arterial calcifications at the skull base.  CT CERVICAL SPINE FINDINGS  Extensive multilevel cervical spine degenerative changes. No prevertebral soft tissue swelling, fractures or subluxations. Bilateral vertebral and carotid artery atheromatous calcifications. Bullous changes at both lung apices.  IMPRESSION: 1. No skull fracture or intracranial hemorrhage. 2. No cervical spine fracture or subluxation. 3. Stable mid atrophy and chronic small vessel white matter ischemic changes. 4. Stable marked cervical spine degenerative changes. 5. COPD.   Electronically Signed   By: Enrique Sack M.D.   On: 05/30/2014 19:07   Ct Abdomen Pelvis W Contrast  05/30/2014   CLINICAL DATA:  [place] today. Complaining of right shoulder and left hip pain.  EXAM: CT CHEST, ABDOMEN, AND PELVIS WITH CONTRAST  TECHNIQUE: Multidetector CT imaging of the chest, abdomen and pelvis was  performed following the standard protocol during bolus administration of intravenous contrast.  CONTRAST:  177mL OMNIPAQUE IOHEXOL 300 MG/ML  SOLN  COMPARISON:  Chest CT, 08/30/2013.  FINDINGS: CT CHEST FINDINGS  Heart is mildly enlarged. There are dense coronary artery and mitral valve annular calcifications. Aorta is normal in caliber. There is partly calcified mild plaque along the thoracic aorta and at the origin of the left subclavian artery. No significant stenosis.  Right left pulmonary arteries are dilated to 3 cm, stable.  Borderline enlarged right peritracheal lymph node measures 1 cm short axis, stable. No mediastinal masses. No hilar masses or adenopathy.  Lungs show heterogeneous coarse peripheral reticular opacities consistent with interstitial fibrosis, with a  pattern suggesting UIP. Focal opacity noted in the left lower lobe on the prior exam measuring 15 mm x 7 mm, is without significant change. No acute lung consolidation or evidence of edema. No pleural effusion or pneumothorax.  CT ABDOMEN AND PELVIS FINDINGS  Liver shows morphologic changes consistent with cirrhosis. No liver mass or focal lesion. No contusion or laceration.  Mean is prominent measuring 12.4 cm in greatest dimension. No splenic mass or focal lesion.  Gallbladder is unremarkable. No bile duct dilation. No pancreatic masses or inflammation. No adrenal masses.  Mild bilateral renal cortical thinning. There is a peripherally calcified hypo attenuating, 3.1 cm mass protruding from the inferior margin of the left kidney, not imaged on the prior chest CT. This is likely a mildly complicated cyst. There is a low attenuation lesion in the upper pole left kidney, most likely a simple cyst. No other renal masses or lesions. No hydronephrosis. Normal ureters. Bladder is unremarkable.  There is an infrarenal abdominal aortic aneurysm measuring 3.7 cm in greatest anterior-posterior dimension. No evidence of rupture. Atherosclerotic changes  noted along the abdominal aorta and its branch vessels.  No pathologically enlarged lymph nodes. No abnormal fluid collections. Specifically no evidence of hemo peritoneum.  No bowel wall thickening to suggest a bowel hematoma. No bowel inflammatory changes. No mesenteric inflammation or contusion/hematoma.  MUSCULOSKELETAL:  There fractures of the right acetabulum. Fracture extends across the posterior column, extending across the medial acetabulum to the anterior,. Fracture is mildly distracted by 7 mm. There is mild fracture comminution with small fracture fragments noted between the major fracture components, at least 1 within the hip joint.  There is a fracture of the left femoral neck. This is subcapital, oblique and non comminuted. Fracture is displaced with the distal fracture component migrating superiorly by 17 mm. There is also mild varus angulation as well as significant apex anterior angulation.  No other pelvic or proximal femur fractures.  There are no other fractures.  IMPRESSION: 1. Fractures of the right acetabulum involving the anterior posterior columns. 2. Fracture of the left femoral neck, mildly displaced and angulated. 3. No other fractures. 4. No other acute findings in the chest, abdomen or pelvis. No other evidence of acute injury. 5. Chronic findings include cardiomegaly, coronary artery calcifications and changes consistent with interstitial fibrosis. Pulmonary artery dilation suggest pulmonary artery hypertension. Below the diaphragm, there changes of cirrhosis with borderline splenomegaly. Probable left renal cysts. 6. 3.7 cm infrarenal abdominal aortic aneurysm. No evidence of rupture.   Electronically Signed   By: Lajean Manes M.D.   On: 05/30/2014 22:06   Pelvis Portable  06/01/2014   CLINICAL DATA:  Postop left total hip.  EXAM: PORTABLE PELVIS 1-2 VIEWS  COMPARISON:  05/31/2014  FINDINGS: Patient has undergone total hip arthroplasty. Left hip prosthesis appears well seated. No  evidence for dislocation on this frontal view. There is asymmetry of the SI joint, likely related to pelvic fractures. Right acetabular fracture again noted. There is deformity of the right inferior pubic ramus.  IMPRESSION: 1. Status post left total hip arthroplasty. 2. No evidence for dislocation on this frontal view.   Electronically Signed   By: Shon Hale M.D.   On: 06/01/2014 17:07   Dg Pelvis Portable  05/30/2014   CLINICAL DATA:  [place] today at 3 o'clock. Tripped over a door way. Left hip pain.  EXAM: PORTABLE PELVIS 1-2 VIEWS PORTABLE ONE-VIEW CHEST X-RAY.  COMPARISON:  HIP RADIOGRAPHS 10/01/2013 AND CHEST X-RAY 08/26/2013.  FINDINGS:  CHEST X-RAY:  The heart is mildly enlarged but stable. There is tortuosity and calcification of the thoracic aorta. Chronic lung changes with pulmonary fibrosis. No definite acute overlying pulmonary process. The bony thorax is grossly intact.  Pelvis:  There is a displaced left femoral neck fracture. The right hip is intact. Moderate degenerative changes bilaterally, right greater than left. The pubic symphysis and SI joints are intact. No pelvic fractures. Extensive vascular calcifications are noted.  IMPRESSION: 1. Displaced left femoral neck fracture. 2. No acute cardiopulmonary findings. Chronic lung changes/pulmonary fibrosis.   Electronically Signed   By: Kalman Jewels M.D.   On: 05/30/2014 17:36   Dg Pelvis Comp Min 3v  05/31/2014   CLINICAL DATA:  Right acetabular fracture  EXAM: JUDET PELVIS - 3+ VIEW  COMPARISON:  05/30/2014  FINDINGS: Again noted changes consistent with a left femoral neck fracture with impaction. The right acetabular fracture is again noted and unchanged. No right femoral abnormality seen.  IMPRESSION: Right acetabular fracture stable from the prior exam.  Left femoral neck fracture stable from the prior exam.   Electronically Signed   By: Inez Catalina M.D.   On: 05/31/2014 11:34   Ct 3d Recon At Scanner  05/31/2014   CLINICAL DATA:   Nonspecific (abnormal) findings on radiological and other examination of musculoskeletal system. Right acetabular fracture. Left femoral neck fracture.  EXAM: 3-DIMENSIONAL CT IMAGE RENDERING ON ACQUISITION WORKSTATION  TECHNIQUE: 3-dimensional CT images were rendered by post-processing of the original CT data on an acquisition workstation.  COMPARISON:  CT scan and radiographs dated 05/30/2014  FINDINGS: There is a oblique vertical fracture through the medial aspect of the right acetabulum the with approximately 8 mm of distraction. There is also a subtle old healed fracture of the right inferior pubic ramus. I do not think these are acute.  There is an acute subcapital fracture of the left femoral neck with impaction and angulation. There are slight arthritic changes of the superior lateral aspect of the left acetabulum.  The 3D images better demonstrate the right acetabular fracture.  IMPRESSION: Slightly distracted subacute fracture through the medial aspect of the right acetabulum. Old healed fracture of the low right inferior pubic ramus.   Electronically Signed   By: Rozetta Nunnery M.D.   On: 05/31/2014 11:37   Dg Chest Port 1 View  05/31/2014   CLINICAL DATA:  80 year old male with low oxygen level. Subsequent encounter.  EXAM: PORTABLE CHEST - 1 VIEW  COMPARISON:  05/30/2014 and 08/26/2013.  FINDINGS: Pulmonary vascular congestion superimposed upon chronic lung changes.  Elevated left hemidiaphragm with left base subsegmental atelectasis.  No gross pneumothorax.  Cardiomegaly.  Calcified mildly tortuous aorta.  IMPRESSION: Pulmonary vascular congestion superimposed upon chronic lung changes.  Elevated left hemidiaphragm with left base subsegmental atelectasis.  No gross pneumothorax.  Cardiomegaly.  Calcified mildly tortuous aorta.   Electronically Signed   By: Chauncey Cruel M.D.   On: 05/31/2014 06:55   Dg Chest Port 1 View  05/30/2014   CLINICAL DATA:  80 year old male who fell at 1500 hr today  tripping over door way. Acute pain. Initial encounter.  EXAM: PORTABLE CHEST - 1 VIEW  COMPARISON:  Chest CT 08/30/2013 and earlier.  FINDINGS: Portable AP semi upright view at 1716 hr. Lower lung volumes. Stable cardiomegaly and mediastinal contours. Peripheral increased opacity greater in the left lung is stable. No pneumothorax, pulmonary edema, or pleural effusion identified. No acute pulmonary opacity identified. Calcified atherosclerosis of the aorta. Visualized tracheal  air column is within normal limits. No acute osseous abnormality identified.  IMPRESSION: Cardiomegaly and chronic lung disease. No superimposed acute findings are identified.   Electronically Signed   By: Lars Pinks M.D.   On: 05/30/2014 17:35    ASSESSMENT/PLAN:  Left hip fracture status post bipolar arthroplasty - for rehabilitation Diabetes mellitus, type II - continue Glucotrol XL 10 mg by mouth daily and and Glucophage XR at 1000 mg by mouth twice a day BPH - continue Proscar 5 mg by mouth daily History of atrial fibrillation status post ablation - rate controlled; continue Betapace Hyperlipidemia - continue Lipitor 40 mg by mouth daily Anemia, acute blood loss - stable; hemoglobin 10.0 Constipation start Colace 100 mg by mouth twice a day, MiraLAX 17 g +4-6 ounces liquid by mouth daily and senna S2 tabs by mouth twice a day 3 days then daily at bedtime    Spent 50 minutes in patient care.     [facility], NP [employer] [phone]

## 2014-06-08 ENCOUNTER — Other Ambulatory Visit: Payer: Self-pay | Admitting: *Deleted

## 2014-06-08 MED ORDER — HYDROCODONE-ACETAMINOPHEN 5-325 MG PO TABS: ORAL_TABLET | ORAL | Status: DC

## 2014-06-08 NOTE — Telephone Encounter (Signed)
[facility] 

## 2014-06-09 ENCOUNTER — Other Ambulatory Visit: Payer: Self-pay | Admitting: *Deleted

## 2014-06-09 MED ORDER — TRAMADOL HCL 50 MG PO TABS: ORAL_TABLET | ORAL | Status: DC

## 2014-06-09 NOTE — Telephone Encounter (Signed)
[facility] 

## 2014-06-10 ENCOUNTER — Non-Acute Institutional Stay (SKILLED_NURSING_FACILITY): Payer: Medicare Other | Admitting: Internal Medicine

## 2014-06-10 ENCOUNTER — Encounter: Payer: Self-pay | Admitting: Internal Medicine

## 2014-06-10 DIAGNOSIS — I482 Chronic atrial fibrillation, unspecified: Secondary | ICD-10-CM

## 2014-06-10 DIAGNOSIS — K5900 Constipation, unspecified: Secondary | ICD-10-CM

## 2014-06-10 DIAGNOSIS — D62 Acute posthemorrhagic anemia: Secondary | ICD-10-CM

## 2014-06-10 DIAGNOSIS — I2489 Other forms of acute ischemic heart disease: Secondary | ICD-10-CM

## 2014-06-10 DIAGNOSIS — S43005A Unspecified dislocation of left shoulder joint, initial encounter: Secondary | ICD-10-CM

## 2014-06-10 DIAGNOSIS — E119 Type 2 diabetes mellitus without complications: Secondary | ICD-10-CM

## 2014-06-10 DIAGNOSIS — S72002S Fracture of unspecified part of neck of left femur, sequela: Secondary | ICD-10-CM

## 2014-06-10 DIAGNOSIS — I248 Other forms of acute ischemic heart disease: Secondary | ICD-10-CM

## 2014-06-10 DIAGNOSIS — E785 Hyperlipidemia, unspecified: Secondary | ICD-10-CM

## 2014-06-10 NOTE — Progress Notes (Signed)
Patient ID: [name] [name], male   DOB: 1927/05/03, 80 y.o.   MRN: [id number]    [name]  Chief Complaint  Patient presents with  . New Admit To SNF   No Known Allergies  HPI 80 y/o male patient is here for STR post hospital admission 05/30/14-06/06/14 after a fall with left hip fracture. He underwent left hip arthroplasty. He is seen in his room today. He has not been able to move his left shoulder. He had an xray of his left shoulder done, reviewed it today s/o possible left acromioclavicular joint seperation. He complaints of pain in his shoulder and numbness in left arm.He denies any other concerns.  He has history of atrial fibrillation status post ablation, diabetes mellitus, hypertension and hyperlipidemia. Pain in his left hip under control with current regimen  Review of Systems  Constitutional: Negative for fever, chills, malaise/fatigue and diaphoresis.  HENT: Negative for congestion Respiratory: Negative for cough, shortness of breath  Cardiovascular: Negative for chest pain, palpitations Gastrointestinal: Negative for heartburn, nausea, vomiting, abdominal pain Genitourinary: Negative for dysuria Skin: Negative for itching and rash.  Neurological: Negative for dizziness Psychiatric/Behavioral: Negative for depression   Past Medical History  Diagnosis Date  . Diabetes   . Atrial fibrillation   . Gynecomastia   . RBBB   . High cholesterol   . Hiatal hernia   . AAA (abdominal aortic aneurysm)   . Heart murmur   . Neuropathic impotence   . Neuropathy   . Macular degeneration, wet   . Pulmonary fibrosis   . Liver disease   . Left displaced femoral neck fracture 05/30/2014  . Elevated troponin    Past Surgical History  Procedure Laterality Date  . Colostomy  1960  . Shoulder surgery Right   . Nose surgery      multiple times--broken nose/lacerations  . Hip arthroplasty Left 06/01/2014    Procedure: ARTHROPLASTY BIPOLAR HIP;   Surgeon: Johnny Bridge, MD;  Location: [place];  Service: Orthopedics;  Laterality: Left;   Medication reviewed. See MAR  Family History  Problem Relation Age of Onset  . Rectal cancer Father   . COPD Brother    History   Social History  . Marital Status: Widowed    Spouse Name: N/A    Number of Children: N/A  . Years of Education: N/A   Occupational History  . retired    Social History Main Topics  . Smoking status: Current Every Day Smoker -- 0.50 packs/day for 63 years    Types: Cigarettes  . Smokeless tobacco: Not on file     Comment: smoke about 14-15 cigs qd  . Alcohol Use: No     Comment: quit 8 years ago  . Drug Use: No  . Sexual Activity: Not on file   Other Topics Concern  . Not on file   Social History Narrative    Physical exam BP 135/60 mmHg  Pulse 65  Temp(Src) 96.1 F (35.6 C)  Resp 18  SpO2 93%  General- elderly male in no acute distress Head- atraumatic, normocephalic Eyes- PERRLA, EOMI, no pallor, no icterus Neck- no lymphadenopathy Cardiovascular- normal s1,s2, no murmurs/ rubs/ gallops, good radial and dorsalis pedis pulses Respiratory- bilateral clear to auscultation, no wheeze, no rhonchi, no crackles Abdomen- bowel sounds present, soft, non tender Musculoskeletal- unable to move his left arm at shoulder area. , left leg ROM limited at hip joint Neurological- no focal deficit Psychiatry- alert and oriented  Labs Lab Results  Component Value Date   WBC 6.3 06/06/2014   HGB 10.0* 06/06/2014   HCT 30.8* 06/06/2014   MCV 90.6 06/06/2014   PLT 188 06/06/2014   CMP Latest Ref Rng 06/06/2014 06/05/2014 06/04/2014  Glucose 70 - 99 mg/dL 221(H) 202(H) 168(H)  BUN 6 - 23 mg/dL 22 27(H) 31(H)  Creatinine 0.50 - 1.35 mg/dL 0.81 0.87 0.93  Sodium 137 - 147 mEq/L 140 141 138  Potassium 3.7 - 5.3 mEq/L 4.2 4.1 4.1  Chloride 96 - 112 mEq/L 102 103 100  CO2 19 - 32 mEq/L 27 30 24   Calcium 8.4 - 10.5 mg/dL 8.6 8.7 8.8  Total Protein 6.0 - 8.3  g/dL - - -  Total Bilirubin 0.3 - 1.2 mg/dL - - -  Alkaline Phos 39 - 117 U/L - - -  AST 0 - 37 U/L - - -  ALT 0 - 53 U/L - - -      Assessment/plan  Left hip fracture  status post mechanical fall and is now s/p arthroplasty. Continue lovenox for dvt prophylaxis. Will have patient work with PT/OT as tolerated to regain strength and restore function.  Fall precautions are in place. NWB in LLE. Has f/u with orthopedics Dr Mardelle Matte. Continue tramadol 50 mg bid with tylenol 1000 mg q6h prn pain. Off norco on request of pt and family   Left shoulder dislocation Reviewed xray s/o acromioclavicular seperation. Will order stat MRI of left shoulder to assess for need for surgical intervention. Pt to be seen in Dr Luanna Cole office tomorrow. Provide pain medication and will have his left UE on splint for now  Demand ischemia Remains chest pain free. Continue sotalol, aspirin and statin  Dm type 2 continue Glucotrol XL 10 mg daily and Glucophage XR 1000 mg twice a day  atrial fibrillation  status post ablation. continue sotalol  BPH continue Proscar 5 mg daily  Hyperlipidemia continue Lipitor 40 mg daily  Anemia From acute blood loss. Monitor h&h    Constipation  Continue Colace 100 mg bid, senna s bid and miralax daily   Family/ staff Communication: reviewed care plan with patient and nursing supervisor  Goals of care: short term rehabilitation  Lab- cbc with diff, cmp next lab

## 2014-06-13 ENCOUNTER — Other Ambulatory Visit: Payer: Self-pay | Admitting: Internal Medicine

## 2014-06-13 DIAGNOSIS — M25512 Pain in left shoulder: Secondary | ICD-10-CM

## 2014-06-20 ENCOUNTER — Ambulatory Visit (HOSPITAL_COMMUNITY): Payer: Medicare Other

## 2014-06-24 ENCOUNTER — Encounter: Payer: Self-pay | Admitting: Adult Health

## 2014-06-24 ENCOUNTER — Non-Acute Institutional Stay (SKILLED_NURSING_FACILITY): Payer: Medicare Other | Admitting: Adult Health

## 2014-06-24 DIAGNOSIS — I714 Abdominal aortic aneurysm, without rupture, unspecified: Secondary | ICD-10-CM

## 2014-06-24 DIAGNOSIS — Z720 Tobacco use: Secondary | ICD-10-CM

## 2014-06-24 DIAGNOSIS — R42 Dizziness and giddiness: Secondary | ICD-10-CM

## 2014-06-24 DIAGNOSIS — H6123 Impacted cerumen, bilateral: Secondary | ICD-10-CM

## 2014-06-24 NOTE — Progress Notes (Signed)
Patient ID: [name] [name], male   DOB: 1926-08-16, 80 y.o.   MRN: [id number]   06/24/2014  Facility:  [facility]ursing Home Location:  [place] Room Number: 749-S LEVEL OF CARE:  SNF (31)   Chief Complaint  Patient presents with  . Acute Visit    Vertigo, bilateral impacted cerumen, tobacco abuse and history of  AAA    HISTORY OF PRESENT ILLNESS:  This is an 80 year old male who complains of dizziness when lying down and standing up. His blood pressures has been within normal, current BP 128/84. No noted orthostatic hypotension. Son-in-law and grandson at bedside. Family reported patient having a history of impacted cerumen, using 14 - 15 sticks of cigarettes/day and has AAA. Family reported patient mentioning about left quadrant abdominal pain. He is being followed up by vascular doctor but family cannot recall name of practice.   PAST MEDICAL HISTORY:  Past Medical History  Diagnosis Date  . Diabetes   . Atrial fibrillation   . Gynecomastia   . RBBB   . High cholesterol   . Hiatal hernia   . AAA (abdominal aortic aneurysm)   . Heart murmur   . Neuropathic impotence   . Neuropathy   . Macular degeneration, wet   . Pulmonary fibrosis   . Liver disease   . Left displaced femoral neck fracture 05/30/2014  . Elevated troponin     CURRENT MEDICATIONS: Reviewed per MAR/see medication list  No Known Allergies   REVIEW OF SYSTEMS:  GENERAL: no change in appetite, no fatigue, no weight changes, no fever, chills or weakness RESPIRATORY: no cough, SOB, DOE, wheezing, hemoptysis CARDIAC: no chest pain, edema or palpitations GI: no abdominal pain, diarrhea, heart burn, nausea or vomiting NEURO:  + dizziness  PHYSICAL EXAMINATION  GENERAL: no acute distress, normal body habitus SKIN:  Left hip surgical incision is dry, no erythema EYES: conjunctivae normal, sclerae normal, normal eye lids EARS:  Bilateral ears has moderate yellowish ear wax NECK:  supple, trachea midline, no neck masses, no thyroid tenderness, no thyromegaly LYMPHATICS: no LAN in the neck, no supraclavicular LAN RESPIRATORY: breathing is even & unlabored, BS CTAB CARDIAC: RRR, no murmur,no extra heart sounds, no edema GI: abdomen soft, normal BS, no masses, no tenderness, no hepatomegaly, no splenomegaly EXTREMITIES:  Able to move all 4 extremities; uses wheelchair PSYCHIATRIC: the patient is alert & oriented to person, affect & behavior appropriate  LABS/RADIOLOGY: 06/14/14  WBC 7.5 hemoglobin 11.3 hematocrit 36.5 MCV 94.8 06/11/14  WBC 8.0 hemoglobin 9.6 hematocrit 30.2 MCV 92.4 sodium 137 potassium 4.6 glucose 169 BUN 15 creatinine 0.7 calcium 9.1 total protein 5.8 albumin 2.8 and globulin 3.0 total bilirubin 0.6 ALP 148 AST 29 ALT 29 06/09/14  x-ray of left shoulder shows low-grade acromioclavicular joint separation and degenerative joint disease. Correlate clinically also for potential impingement syndrome 06/07/14  WBC 7.4 hemoglobin 10.1 hematocrit 32.4 MCV 93.4 Labs reviewed: Basic Metabolic Panel:  Recent Labs  06/04/14 0450 06/05/14 0753 06/06/14 0630  NA 138 141 140  K 4.1 4.1 4.2  CL 100 103 102  CO2 24 30 27   GLUCOSE 168* 202* 221*  BUN 31* 27* 22  CREATININE 0.93 0.87 0.81  CALCIUM 8.8 8.7 8.6   Liver Function Tests:  Recent Labs  05/31/14 0315 06/01/14 0259  AST 17 24  ALT 12 12  ALKPHOS 94 84  BILITOT 0.4 0.5  PROT 6.9 6.7  ALBUMIN 3.3* 3.1*    CBC:  Recent Labs  10/01/13 1222 05/30/14 1701 05/31/14 0315  06/04/14 0450 06/05/14 0753 06/06/14 0630  WBC 6.1 9.6 9.9  < > 7.8 6.8 6.3  NEUTROABS 3.9 7.9* 8.5*  --   --   --   --   HGB 12.2* 12.4* 12.1*  < > 9.6* 9.9* 10.0*  HCT 35.2* 37.9* 37.4*  < > 29.5* 30.3* 30.8*  MCV 88.4 89.4 89.5  < > 88.6 88.9 90.6  PLT 117* 156 151  < > 164 180 188  < > = values in this interval not displayed.  Cardiac Enzymes:  Recent Labs  09/27/13 1513  05/31/14 0615 05/31/14 1235  05/31/14 1649  CKTOTAL 28  --   --   --   --   CKMB 1.1  --   --   --   --   TROPONINI  --   < > 0.75* 0.51* 0.52*  < > = values in this interval not displayed. BNP: Invalid input(s): POCBNP CBG:  Recent Labs  06/05/14 2356 06/06/14 0744 06/06/14 1152  GLUCAP 191* 222* 266*    Dg Chest 2 View  06/03/2014   CLINICAL DATA:  Chest pain and difficulty breathing  EXAM: CHEST  2 VIEW  COMPARISON:  May 31, 2014 and August 26, 2013  FINDINGS: There is chronic interstitial fibrosis in both mid and lower lung zones. There is no frank edema or consolidation. Heart is enlarged with pulmonary vascularity within normal limits. No adenopathy. There is atherosclerotic change in aorta. No adenopathy. No appreciable bone lesions.  IMPRESSION: Chronic interstitial fibrotic type change. No frank edema or consolidation. Stable cardiomegaly. No adenopathy.   Electronically Signed   By: Lowella Grip M.D.   On: 06/03/2014 12:03   Dg Thoracic Spine 2 View  05/30/2014   CLINICAL DATA:  Fall, mid back pain  EXAM: THORACIC SPINE - 2 VIEW  COMPARISON:  None.  FINDINGS: Normal thoracic kyphosis.  No evidence of fracture dislocation. Vertebral body heights are maintained.  Moderate multilevel degenerative changes.  Vascular calcifications.  Visualized lungs are clear.  IMPRESSION: No fracture or dislocation is seen.  Moderate multilevel degenerative changes.   Electronically Signed   By: Julian Hy M.D.   On: 05/30/2014 19:23   Dg Lumbar Spine Complete  05/30/2014   CLINICAL DATA:  Fall, mid/lower back pain  EXAM: LUMBAR SPINE - COMPLETE 4+ VIEW  COMPARISON:  CT abdomen dated 08/27/2013  FINDINGS: Five lumbar type vertebral bodies.  Normal lumbar lordosis.  No evidence of fracture or dislocation. Vertebral body heights are maintained.  Grade 1 anterolisthesis of L4 on L5.  Moderate multilevel degenerative changes.  Visualized bony pelvis appears intact.  Vascular calcifications with known infrarenal  abdominal aortic aneurysm.a  IMPRESSION: No fracture or dislocation is seen.  Moderate degenerative changes.  Grade 1 anterolisthesis of L4 on L5.   Electronically Signed   By: Julian Hy M.D.   On: 05/30/2014 20:01   Dg Shoulder Right  05/30/2014   CLINICAL DATA:  Fall, right shoulder pain  EXAM: RIGHT SHOULDER - 2+ VIEW  COMPARISON:  None.  FINDINGS: No fracture or dislocation is seen.  Moderate degenerative changes of the glenohumeral joint and acromioclavicular joint  The visualized soft tissues are unremarkable.  Visualized right lung is clear.  IMPRESSION: No fracture or dislocation is seen.  Moderate degenerative changes.   Electronically Signed   By: Julian Hy M.D.   On: 05/30/2014 19:22   Dg Hip Complete Left  05/30/2014   CLINICAL DATA:  Left hip pain after falling at home today at 1500 hr.  EXAM: LEFT HIP - COMPLETE 2+ VIEW  COMPARISON:  Portable pelvis obtained earlier today.  FINDINGS: Left femoral neck fracture with mild valgus angulation. Atheromatous arterial calcifications.  IMPRESSION: Previously noted mildly displaced left femoral neck fracture with mild valgus angulation.   Electronically Signed   By: Enrique Sack M.D.   On: 05/30/2014 19:21   Ct Head Wo Contrast  05/30/2014   CLINICAL DATA:  Right neck pain after falling today and hitting his shoulder against a door jamb.  EXAM: CT HEAD WITHOUT CONTRAST  CT CERVICAL SPINE WITHOUT CONTRAST  TECHNIQUE: Multidetector CT imaging of the head and cervical spine was performed following the standard protocol without intravenous contrast. Multiplanar CT image reconstructions of the cervical spine were also generated.  COMPARISON:  10/01/2013.  FINDINGS: CT HEAD FINDINGS  Diffusely enlarged ventricles and subarachnoid spaces. Patchy white matter low density in both cerebral hemispheres. No skull fracture, intracranial hemorrhage or paranasal sinus air-fluid levels. Dense arterial calcifications at the skull base.  CT CERVICAL SPINE  FINDINGS  Extensive multilevel cervical spine degenerative changes. No prevertebral soft tissue swelling, fractures or subluxations. Bilateral vertebral and carotid artery atheromatous calcifications. Bullous changes at both lung apices.  IMPRESSION: 1. No skull fracture or intracranial hemorrhage. 2. No cervical spine fracture or subluxation. 3. Stable mid atrophy and chronic small vessel white matter ischemic changes. 4. Stable marked cervical spine degenerative changes. 5. COPD.   Electronically Signed   By: Enrique Sack M.D.   On: 05/30/2014 19:07   Ct Chest W Contrast  05/30/2014   CLINICAL DATA:  [place] today. Complaining of right shoulder and left hip pain.  EXAM: CT CHEST, ABDOMEN, AND PELVIS WITH CONTRAST  TECHNIQUE: Multidetector CT imaging of the chest, abdomen and pelvis was performed following the standard protocol during bolus administration of intravenous contrast.  CONTRAST:  154mL OMNIPAQUE IOHEXOL 300 MG/ML  SOLN  COMPARISON:  Chest CT, 08/30/2013.  FINDINGS: CT CHEST FINDINGS  Heart is mildly enlarged. There are dense coronary artery and mitral valve annular calcifications. Aorta is normal in caliber. There is partly calcified mild plaque along the thoracic aorta and at the origin of the left subclavian artery. No significant stenosis.  Right left pulmonary arteries are dilated to 3 cm, stable.  Borderline enlarged right peritracheal lymph node measures 1 cm short axis, stable. No mediastinal masses. No hilar masses or adenopathy.  Lungs show heterogeneous coarse peripheral reticular opacities consistent with interstitial fibrosis, with a pattern suggesting UIP. Focal opacity noted in the left lower lobe on the prior exam measuring 15 mm x 7 mm, is without significant change. No acute lung consolidation or evidence of edema. No pleural effusion or pneumothorax.  CT ABDOMEN AND PELVIS FINDINGS  Liver shows morphologic changes consistent with cirrhosis. No liver mass or focal lesion. No contusion or  laceration.  Mean is prominent measuring 12.4 cm in greatest dimension. No splenic mass or focal lesion.  Gallbladder is unremarkable. No bile duct dilation. No pancreatic masses or inflammation. No adrenal masses.  Mild bilateral renal cortical thinning. There is a peripherally calcified hypo attenuating, 3.1 cm mass protruding from the inferior margin of the left kidney, not imaged on the prior chest CT. This is likely a mildly complicated cyst. There is a low attenuation lesion in the upper pole left kidney, most likely a simple cyst. No other renal masses or lesions. No hydronephrosis. Normal ureters. Bladder is unremarkable.  There is  an infrarenal abdominal aortic aneurysm measuring 3.7 cm in greatest anterior-posterior dimension. No evidence of rupture. Atherosclerotic changes noted along the abdominal aorta and its branch vessels.  No pathologically enlarged lymph nodes. No abnormal fluid collections. Specifically no evidence of hemo peritoneum.  No bowel wall thickening to suggest a bowel hematoma. No bowel inflammatory changes. No mesenteric inflammation or contusion/hematoma.  MUSCULOSKELETAL:  There fractures of the right acetabulum. Fracture extends across the posterior column, extending across the medial acetabulum to the anterior,. Fracture is mildly distracted by 7 mm. There is mild fracture comminution with small fracture fragments noted between the major fracture components, at least 1 within the hip joint.  There is a fracture of the left femoral neck. This is subcapital, oblique and non comminuted. Fracture is displaced with the distal fracture component migrating superiorly by 17 mm. There is also mild varus angulation as well as significant apex anterior angulation.  No other pelvic or proximal femur fractures.  There are no other fractures.  IMPRESSION: 1. Fractures of the right acetabulum involving the anterior posterior columns. 2. Fracture of the left femoral neck, mildly displaced and  angulated. 3. No other fractures. 4. No other acute findings in the chest, abdomen or pelvis. No other evidence of acute injury. 5. Chronic findings include cardiomegaly, coronary artery calcifications and changes consistent with interstitial fibrosis. Pulmonary artery dilation suggest pulmonary artery hypertension. Below the diaphragm, there changes of cirrhosis with borderline splenomegaly. Probable left renal cysts. 6. 3.7 cm infrarenal abdominal aortic aneurysm. No evidence of rupture.   Electronically Signed   By: Lajean Manes M.D.   On: 05/30/2014 22:06   Ct Cervical Spine Wo Contrast  05/30/2014   CLINICAL DATA:  Right neck pain after falling today and hitting his shoulder against a door jamb.  EXAM: CT HEAD WITHOUT CONTRAST  CT CERVICAL SPINE WITHOUT CONTRAST  TECHNIQUE: Multidetector CT imaging of the head and cervical spine was performed following the standard protocol without intravenous contrast. Multiplanar CT image reconstructions of the cervical spine were also generated.  COMPARISON:  10/01/2013.  FINDINGS: CT HEAD FINDINGS  Diffusely enlarged ventricles and subarachnoid spaces. Patchy white matter low density in both cerebral hemispheres. No skull fracture, intracranial hemorrhage or paranasal sinus air-fluid levels. Dense arterial calcifications at the skull base.  CT CERVICAL SPINE FINDINGS  Extensive multilevel cervical spine degenerative changes. No prevertebral soft tissue swelling, fractures or subluxations. Bilateral vertebral and carotid artery atheromatous calcifications. Bullous changes at both lung apices.  IMPRESSION: 1. No skull fracture or intracranial hemorrhage. 2. No cervical spine fracture or subluxation. 3. Stable mid atrophy and chronic small vessel white matter ischemic changes. 4. Stable marked cervical spine degenerative changes. 5. COPD.   Electronically Signed   By: Enrique Sack M.D.   On: 05/30/2014 19:07   Ct Abdomen Pelvis W Contrast  05/30/2014   CLINICAL DATA:   [place] today. Complaining of right shoulder and left hip pain.  EXAM: CT CHEST, ABDOMEN, AND PELVIS WITH CONTRAST  TECHNIQUE: Multidetector CT imaging of the chest, abdomen and pelvis was performed following the standard protocol during bolus administration of intravenous contrast.  CONTRAST:  179mL OMNIPAQUE IOHEXOL 300 MG/ML  SOLN  COMPARISON:  Chest CT, 08/30/2013.  FINDINGS: CT CHEST FINDINGS  Heart is mildly enlarged. There are dense coronary artery and mitral valve annular calcifications. Aorta is normal in caliber. There is partly calcified mild plaque along the thoracic aorta and at the origin of the left subclavian artery. No significant stenosis.  Right  left pulmonary arteries are dilated to 3 cm, stable.  Borderline enlarged right peritracheal lymph node measures 1 cm short axis, stable. No mediastinal masses. No hilar masses or adenopathy.  Lungs show heterogeneous coarse peripheral reticular opacities consistent with interstitial fibrosis, with a pattern suggesting UIP. Focal opacity noted in the left lower lobe on the prior exam measuring 15 mm x 7 mm, is without significant change. No acute lung consolidation or evidence of edema. No pleural effusion or pneumothorax.  CT ABDOMEN AND PELVIS FINDINGS  Liver shows morphologic changes consistent with cirrhosis. No liver mass or focal lesion. No contusion or laceration.  Mean is prominent measuring 12.4 cm in greatest dimension. No splenic mass or focal lesion.  Gallbladder is unremarkable. No bile duct dilation. No pancreatic masses or inflammation. No adrenal masses.  Mild bilateral renal cortical thinning. There is a peripherally calcified hypo attenuating, 3.1 cm mass protruding from the inferior margin of the left kidney, not imaged on the prior chest CT. This is likely a mildly complicated cyst. There is a low attenuation lesion in the upper pole left kidney, most likely a simple cyst. No other renal masses or lesions. No hydronephrosis. Normal  ureters. Bladder is unremarkable.  There is an infrarenal abdominal aortic aneurysm measuring 3.7 cm in greatest anterior-posterior dimension. No evidence of rupture. Atherosclerotic changes noted along the abdominal aorta and its branch vessels.  No pathologically enlarged lymph nodes. No abnormal fluid collections. Specifically no evidence of hemo peritoneum.  No bowel wall thickening to suggest a bowel hematoma. No bowel inflammatory changes. No mesenteric inflammation or contusion/hematoma.  MUSCULOSKELETAL:  There fractures of the right acetabulum. Fracture extends across the posterior column, extending across the medial acetabulum to the anterior,. Fracture is mildly distracted by 7 mm. There is mild fracture comminution with small fracture fragments noted between the major fracture components, at least 1 within the hip joint.  There is a fracture of the left femoral neck. This is subcapital, oblique and non comminuted. Fracture is displaced with the distal fracture component migrating superiorly by 17 mm. There is also mild varus angulation as well as significant apex anterior angulation.  No other pelvic or proximal femur fractures.  There are no other fractures.  IMPRESSION: 1. Fractures of the right acetabulum involving the anterior posterior columns. 2. Fracture of the left femoral neck, mildly displaced and angulated. 3. No other fractures. 4. No other acute findings in the chest, abdomen or pelvis. No other evidence of acute injury. 5. Chronic findings include cardiomegaly, coronary artery calcifications and changes consistent with interstitial fibrosis. Pulmonary artery dilation suggest pulmonary artery hypertension. Below the diaphragm, there changes of cirrhosis with borderline splenomegaly. Probable left renal cysts. 6. 3.7 cm infrarenal abdominal aortic aneurysm. No evidence of rupture.   Electronically Signed   By: Lajean Manes M.D.   On: 05/30/2014 22:06   Pelvis Portable  06/01/2014    CLINICAL DATA:  Postop left total hip.  EXAM: PORTABLE PELVIS 1-2 VIEWS  COMPARISON:  05/31/2014  FINDINGS: Patient has undergone total hip arthroplasty. Left hip prosthesis appears well seated. No evidence for dislocation on this frontal view. There is asymmetry of the SI joint, likely related to pelvic fractures. Right acetabular fracture again noted. There is deformity of the right inferior pubic ramus.  IMPRESSION: 1. Status post left total hip arthroplasty. 2. No evidence for dislocation on this frontal view.   Electronically Signed   By: Shon Hale M.D.   On: 06/01/2014 17:07   Dg  Pelvis Portable  05/30/2014   CLINICAL DATA:  [place] today at 3 o'clock. Tripped over a door way. Left hip pain.  EXAM: PORTABLE PELVIS 1-2 VIEWS PORTABLE ONE-VIEW CHEST X-RAY.  COMPARISON:  HIP RADIOGRAPHS 10/01/2013 AND CHEST X-RAY 08/26/2013.  FINDINGS: CHEST X-RAY:  The heart is mildly enlarged but stable. There is tortuosity and calcification of the thoracic aorta. Chronic lung changes with pulmonary fibrosis. No definite acute overlying pulmonary process. The bony thorax is grossly intact.  Pelvis:  There is a displaced left femoral neck fracture. The right hip is intact. Moderate degenerative changes bilaterally, right greater than left. The pubic symphysis and SI joints are intact. No pelvic fractures. Extensive vascular calcifications are noted.  IMPRESSION: 1. Displaced left femoral neck fracture. 2. No acute cardiopulmonary findings. Chronic lung changes/pulmonary fibrosis.   Electronically Signed   By: Kalman Jewels M.D.   On: 05/30/2014 17:36   Dg Pelvis Comp Min 3v  05/31/2014   CLINICAL DATA:  Right acetabular fracture  EXAM: JUDET PELVIS - 3+ VIEW  COMPARISON:  05/30/2014  FINDINGS: Again noted changes consistent with a left femoral neck fracture with impaction. The right acetabular fracture is again noted and unchanged. No right femoral abnormality seen.  IMPRESSION: Right acetabular fracture stable from the  prior exam.  Left femoral neck fracture stable from the prior exam.   Electronically Signed   By: Inez Catalina M.D.   On: 05/31/2014 11:34   Ct 3d Recon At Scanner  05/31/2014   CLINICAL DATA:  Nonspecific (abnormal) findings on radiological and other examination of musculoskeletal system. Right acetabular fracture. Left femoral neck fracture.  EXAM: 3-DIMENSIONAL CT IMAGE RENDERING ON ACQUISITION WORKSTATION  TECHNIQUE: 3-dimensional CT images were rendered by post-processing of the original CT data on an acquisition workstation.  COMPARISON:  CT scan and radiographs dated 05/30/2014  FINDINGS: There is a oblique vertical fracture through the medial aspect of the right acetabulum the with approximately 8 mm of distraction. There is also a subtle old healed fracture of the right inferior pubic ramus. I do not think these are acute.  There is an acute subcapital fracture of the left femoral neck with impaction and angulation. There are slight arthritic changes of the superior lateral aspect of the left acetabulum.  The 3D images better demonstrate the right acetabular fracture.  IMPRESSION: Slightly distracted subacute fracture through the medial aspect of the right acetabulum. Old healed fracture of the low right inferior pubic ramus.   Electronically Signed   By: Rozetta Nunnery M.D.   On: 05/31/2014 11:37   Dg Chest Port 1 View  05/31/2014   CLINICAL DATA:  80 year old male with low oxygen level. Subsequent encounter.  EXAM: PORTABLE CHEST - 1 VIEW  COMPARISON:  05/30/2014 and 08/26/2013.  FINDINGS: Pulmonary vascular congestion superimposed upon chronic lung changes.  Elevated left hemidiaphragm with left base subsegmental atelectasis.  No gross pneumothorax.  Cardiomegaly.  Calcified mildly tortuous aorta.  IMPRESSION: Pulmonary vascular congestion superimposed upon chronic lung changes.  Elevated left hemidiaphragm with left base subsegmental atelectasis.  No gross pneumothorax.  Cardiomegaly.  Calcified  mildly tortuous aorta.   Electronically Signed   By: Chauncey Cruel M.D.   On: 05/31/2014 06:55   Dg Chest Port 1 View  05/30/2014   CLINICAL DATA:  80 year old male who fell at 1500 hr today tripping over door way. Acute pain. Initial encounter.  EXAM: PORTABLE CHEST - 1 VIEW  COMPARISON:  Chest CT 08/30/2013 and earlier.  FINDINGS: Portable AP  semi upright view at 1716 hr. Lower lung volumes. Stable cardiomegaly and mediastinal contours. Peripheral increased opacity greater in the left lung is stable. No pneumothorax, pulmonary edema, or pleural effusion identified. No acute pulmonary opacity identified. Calcified atherosclerosis of the aorta. Visualized tracheal air column is within normal limits. No acute osseous abnormality identified.  IMPRESSION: Cardiomegaly and chronic lung disease. No superimposed acute findings are identified.   Electronically Signed   By: Lars Pinks M.D.   On: 05/30/2014 17:35    ASSESSMENT/PLAN:  Vertigo - start Antivert 25 mg 1 by mouth every morning and 4 PM Bilateral impacted cerumen - start Debrox otic drops instill 8 drops to both ears twice a day 4 days then irrigate on see if they AAA - vascular consult; family will call regarding vascular doctor's name Tobacco abuse - start nicotine patch 14 mg/24 hour 1 patch transdermally daily     [place], NP [employer] [phone]

## 2014-07-07 ENCOUNTER — Encounter: Payer: Self-pay | Admitting: Adult Health

## 2014-07-07 ENCOUNTER — Non-Acute Institutional Stay (SKILLED_NURSING_FACILITY): Payer: Medicare Other | Admitting: Adult Health

## 2014-07-07 DIAGNOSIS — S72002A Fracture of unspecified part of neck of left femur, initial encounter for closed fracture: Secondary | ICD-10-CM

## 2014-07-07 DIAGNOSIS — N400 Enlarged prostate without lower urinary tract symptoms: Secondary | ICD-10-CM

## 2014-07-07 DIAGNOSIS — D62 Acute posthemorrhagic anemia: Secondary | ICD-10-CM

## 2014-07-07 DIAGNOSIS — Z8679 Personal history of other diseases of the circulatory system: Secondary | ICD-10-CM

## 2014-07-07 DIAGNOSIS — I714 Abdominal aortic aneurysm, without rupture, unspecified: Secondary | ICD-10-CM

## 2014-07-07 DIAGNOSIS — E119 Type 2 diabetes mellitus without complications: Secondary | ICD-10-CM

## 2014-07-07 DIAGNOSIS — Z720 Tobacco use: Secondary | ICD-10-CM

## 2014-07-07 DIAGNOSIS — K5900 Constipation, unspecified: Secondary | ICD-10-CM

## 2014-07-07 DIAGNOSIS — E785 Hyperlipidemia, unspecified: Secondary | ICD-10-CM

## 2014-07-07 DIAGNOSIS — R42 Dizziness and giddiness: Secondary | ICD-10-CM

## 2014-07-07 NOTE — Progress Notes (Signed)
Patient ID: [name] [name], male   DOB: [birth year], 80 y.o.   MRN: [id number]   07/07/2014  Facility:  Nursing Home Location:  [place] Room Number: 836-O LEVEL OF CARE:  SNF (31)   Chief Complaint  Patient presents with  . Discharge Note    Left hip fractureS/P arthroplasty, diabetes mellitus, BPH, atrial fibrillation, hyperlipidemia, anemia, tobacco abuse, vertigo, AAA and constipation    HISTORY OF PRESENT ILLNESS:  This is an 80 year old male who is for discharge to [facility] @ [place] . He had a fall and sustained a left hip fracture and had a recent left hip bipolar arthroplasty. Patient was admitted to this facility for short-term rehabilitation after the patient's recent hospitalization.  Patient will continue SNF rehabilitation at  [facility].   REASSESSMENT OF ONGOING PROBLEMS:  HX of ATRIAL FIBRILLATION: the patients atrial fibrillation remains stable.  The patient denies DOE, tachycardia, orthopnea, transient neurological sx, pedal edema, palpitations, & PNDs.  No complications noted from the medications currently being used.  BPH: The patient's BPH remains stable. Patient denies urinary hesitancy or dribbling. No complications reported from the current medications being used.   ANEMIA: The anemia has been stable. The patient denies fatigue, melena or hematochezia. No complications from the medications currently being used. 11/15 hgb 11.3   PAST MEDICAL HISTORY:  Past Medical History  Diagnosis Date  . Diabetes   . Atrial fibrillation   . Gynecomastia   . RBBB   . High cholesterol   . Hiatal hernia   . AAA (abdominal aortic aneurysm)   . Heart murmur   . Neuropathic impotence   . Neuropathy   . Macular degeneration, wet   . Pulmonary fibrosis   . Liver disease   . Left displaced femoral neck fracture 05/30/2014  . Elevated troponin     CURRENT MEDICATIONS: Reviewed per MAR/see medication list  No Known  Allergies   REVIEW OF SYSTEMS:  GENERAL: no change in appetite, no fatigue, no weight changes, no fever, chills or weakness RESPIRATORY: no cough, SOB, DOE, wheezing, hemoptysis CARDIAC: no chest pain, edema or palpitations GI: no abdominal pain, diarrhea, heart burn, nausea or vomiting, +constipation   PHYSICAL EXAMINATION  GENERAL: no acute distress, normal body habitus SKIN:  Left hip surgical incision is dry, no erythema EYES: conjunctivae normal, sclerae normal, normal eye lids EARS:  Slight bright yellow cerumen, not impacted, no redness NECK: supple, trachea midline, no neck masses, no thyroid tenderness, no thyromegaly RESPIRATORY: breathing is even & unlabored, BS CTAB CARDIAC: RRR, no murmur,no extra heart sounds, no edema GI: abdomen soft, normal BS, no masses, no tenderness, no hepatomegaly, no splenomegaly EXTREMITIES:  Able to move all 4 extremities PSYCHIATRIC: the patient is alert & oriented to person, affect & behavior appropriate  LABS/RADIOLOGY: 06/14/14  WBC 7.5 hemoglobin 11.3 hematocrit 36.5 MCV 94.8 06/11/14  WBC 8.0 hemoglobin 9.6 hematocrit 30.2 MCV 92.4 sodium 137 potassium 4.6 glucose 169 BUN 15 creatinine 0.7 calcium 9.1 total protein 5.8 albumin 2.8 and globulin 3.0 total bilirubin 0.6 ALP 148 AST 29 ALT 29 06/09/14  x-ray of left shoulder shows low-grade acromioclavicular joint separation and degenerative joint disease. Correlate clinically also for potential impingement syndrome 06/07/14  WBC 7.4 hemoglobin 10.1 hematocrit 32.4 MCV 93.4  Labs reviewed: Basic Metabolic Panel:  Recent Labs  06/04/14 0450 06/05/14 0753 06/06/14 0630  NA 138 141 140  K 4.1 4.1 4.2  CL 100 103 102  CO2 24 30 27  GLUCOSE 168* 202* 221*  BUN 31* 27* 22  CREATININE 0.93 0.87 0.81  CALCIUM 8.8 8.7 8.6   Liver Function Tests:  Recent Labs  05/31/14 0315 06/01/14 0259  AST 17 24  ALT 12 12  ALKPHOS 94 84  BILITOT 0.4 0.5  PROT 6.9 6.7  ALBUMIN 3.3* 3.1*     CBC:  Recent Labs  10/01/13 1222 05/30/14 1701 05/31/14 0315  06/04/14 0450 06/05/14 0753 06/06/14 0630  WBC 6.1 9.6 9.9  < > 7.8 6.8 6.3  NEUTROABS 3.9 7.9* 8.5*  --   --   --   --   HGB 12.2* 12.4* 12.1*  < > 9.6* 9.9* 10.0*  HCT 35.2* 37.9* 37.4*  < > 29.5* 30.3* 30.8*  MCV 88.4 89.4 89.5  < > 88.6 88.9 90.6  PLT 117* 156 151  < > 164 180 188  < > = values in this interval not displayed.  Cardiac Enzymes:  Recent Labs  09/27/13 1513  05/31/14 0615 05/31/14 1235 05/31/14 1649  CKTOTAL 28  --   --   --   --   CKMB 1.1  --   --   --   --   TROPONINI  --   < > 0.75* 0.51* 0.52*  < > = values in this interval not displayed.  CBG:  Recent Labs  06/05/14 2356 06/06/14 0744 06/06/14 1152  GLUCAP 191* 222* 266*   Dg Chest 2 View  06/03/2014   CLINICAL DATA:  Chest pain and difficulty breathing  EXAM: CHEST  2 VIEW  COMPARISON:  May 31, 2014 and August 26, 2013  FINDINGS: There is chronic interstitial fibrosis in both mid and lower lung zones. There is no frank edema or consolidation. Heart is enlarged with pulmonary vascularity within normal limits. No adenopathy. There is atherosclerotic change in aorta. No adenopathy. No appreciable bone lesions.  IMPRESSION: Chronic interstitial fibrotic type change. No frank edema or consolidation. Stable cardiomegaly. No adenopathy.   Electronically Signed   By: Lowella Grip M.D.   On: 06/03/2014 12:03   Dg Thoracic Spine 2 View  05/30/2014   CLINICAL DATA:  Fall, mid back pain  EXAM: THORACIC SPINE - 2 VIEW  COMPARISON:  None.  FINDINGS: Normal thoracic kyphosis.  No evidence of fracture dislocation. Vertebral body heights are maintained.  Moderate multilevel degenerative changes.  Vascular calcifications.  Visualized lungs are clear.  IMPRESSION: No fracture or dislocation is seen.  Moderate multilevel degenerative changes.   Electronically Signed   By: Julian Hy M.D.   On: 05/30/2014 19:23   Dg Lumbar Spine  Complete  05/30/2014   CLINICAL DATA:  Fall, mid/lower back pain  EXAM: LUMBAR SPINE - COMPLETE 4+ VIEW  COMPARISON:  CT abdomen dated 08/27/2013  FINDINGS: Five lumbar type vertebral bodies.  Normal lumbar lordosis.  No evidence of fracture or dislocation. Vertebral body heights are maintained.  Grade 1 anterolisthesis of L4 on L5.  Moderate multilevel degenerative changes.  Visualized bony pelvis appears intact.  Vascular calcifications with known infrarenal abdominal aortic aneurysm.a  IMPRESSION: No fracture or dislocation is seen.  Moderate degenerative changes.  Grade 1 anterolisthesis of L4 on L5.   Electronically Signed   By: Julian Hy M.D.   On: 05/30/2014 20:01   Dg Shoulder Right  05/30/2014   CLINICAL DATA:  Fall, right shoulder pain  EXAM: RIGHT SHOULDER - 2+ VIEW  COMPARISON:  None.  FINDINGS: No fracture or dislocation is seen.  Moderate degenerative changes  of the glenohumeral joint and acromioclavicular joint  The visualized soft tissues are unremarkable.  Visualized right lung is clear.  IMPRESSION: No fracture or dislocation is seen.  Moderate degenerative changes.   Electronically Signed   By: Julian Hy M.D.   On: 05/30/2014 19:22   Dg Hip Complete Left  05/30/2014   CLINICAL DATA:  Left hip pain after falling at home today at 1500 hr.  EXAM: LEFT HIP - COMPLETE 2+ VIEW  COMPARISON:  Portable pelvis obtained earlier today.  FINDINGS: Left femoral neck fracture with mild valgus angulation. Atheromatous arterial calcifications.  IMPRESSION: Previously noted mildly displaced left femoral neck fracture with mild valgus angulation.   Electronically Signed   By: Enrique Sack M.D.   On: 05/30/2014 19:21   Ct Head Wo Contrast  05/30/2014   CLINICAL DATA:  Right neck pain after falling today and hitting his shoulder against a door jamb.  EXAM: CT HEAD WITHOUT CONTRAST  CT CERVICAL SPINE WITHOUT CONTRAST  TECHNIQUE: Multidetector CT imaging of the head and cervical spine was  performed following the standard protocol without intravenous contrast. Multiplanar CT image reconstructions of the cervical spine were also generated.  COMPARISON:  10/01/2013.  FINDINGS: CT HEAD FINDINGS  Diffusely enlarged ventricles and subarachnoid spaces. Patchy white matter low density in both cerebral hemispheres. No skull fracture, intracranial hemorrhage or paranasal sinus air-fluid levels. Dense arterial calcifications at the skull base.  CT CERVICAL SPINE FINDINGS  Extensive multilevel cervical spine degenerative changes. No prevertebral soft tissue swelling, fractures or subluxations. Bilateral vertebral and carotid artery atheromatous calcifications. Bullous changes at both lung apices.  IMPRESSION: 1. No skull fracture or intracranial hemorrhage. 2. No cervical spine fracture or subluxation. 3. Stable mid atrophy and chronic small vessel white matter ischemic changes. 4. Stable marked cervical spine degenerative changes. 5. COPD.   Electronically Signed   By: Enrique Sack M.D.   On: 05/30/2014 19:07   Ct Chest W Contrast  05/30/2014   CLINICAL DATA:  [place] today. Complaining of right shoulder and left hip pain.  EXAM: CT CHEST, ABDOMEN, AND PELVIS WITH CONTRAST  TECHNIQUE: Multidetector CT imaging of the chest, abdomen and pelvis was performed following the standard protocol during bolus administration of intravenous contrast.  CONTRAST:  144mL OMNIPAQUE IOHEXOL 300 MG/ML  SOLN  COMPARISON:  Chest CT, 08/30/2013.  FINDINGS: CT CHEST FINDINGS  Heart is mildly enlarged. There are dense coronary artery and mitral valve annular calcifications. Aorta is normal in caliber. There is partly calcified mild plaque along the thoracic aorta and at the origin of the left subclavian artery. No significant stenosis.  Right left pulmonary arteries are dilated to 3 cm, stable.  Borderline enlarged right peritracheal lymph node measures 1 cm short axis, stable. No mediastinal masses. No hilar masses or adenopathy.   Lungs show heterogeneous coarse peripheral reticular opacities consistent with interstitial fibrosis, with a pattern suggesting UIP. Focal opacity noted in the left lower lobe on the prior exam measuring 15 mm x 7 mm, is without significant change. No acute lung consolidation or evidence of edema. No pleural effusion or pneumothorax.  CT ABDOMEN AND PELVIS FINDINGS  Liver shows morphologic changes consistent with cirrhosis. No liver mass or focal lesion. No contusion or laceration.  Mean is prominent measuring 12.4 cm in greatest dimension. No splenic mass or focal lesion.  Gallbladder is unremarkable. No bile duct dilation. No pancreatic masses or inflammation. No adrenal masses.  Mild bilateral renal cortical thinning. There is a peripherally calcified  hypo attenuating, 3.1 cm mass protruding from the inferior margin of the left kidney, not imaged on the prior chest CT. This is likely a mildly complicated cyst. There is a low attenuation lesion in the upper pole left kidney, most likely a simple cyst. No other renal masses or lesions. No hydronephrosis. Normal ureters. Bladder is unremarkable.  There is an infrarenal abdominal aortic aneurysm measuring 3.7 cm in greatest anterior-posterior dimension. No evidence of rupture. Atherosclerotic changes noted along the abdominal aorta and its branch vessels.  No pathologically enlarged lymph nodes. No abnormal fluid collections. Specifically no evidence of hemo peritoneum.  No bowel wall thickening to suggest a bowel hematoma. No bowel inflammatory changes. No mesenteric inflammation or contusion/hematoma.  MUSCULOSKELETAL:  There fractures of the right acetabulum. Fracture extends across the posterior column, extending across the medial acetabulum to the anterior,. Fracture is mildly distracted by 7 mm. There is mild fracture comminution with small fracture fragments noted between the major fracture components, at least 1 within the hip joint.  There is a fracture of  the left femoral neck. This is subcapital, oblique and non comminuted. Fracture is displaced with the distal fracture component migrating superiorly by 17 mm. There is also mild varus angulation as well as significant apex anterior angulation.  No other pelvic or proximal femur fractures.  There are no other fractures.  IMPRESSION: 1. Fractures of the right acetabulum involving the anterior posterior columns. 2. Fracture of the left femoral neck, mildly displaced and angulated. 3. No other fractures. 4. No other acute findings in the chest, abdomen or pelvis. No other evidence of acute injury. 5. Chronic findings include cardiomegaly, coronary artery calcifications and changes consistent with interstitial fibrosis. Pulmonary artery dilation suggest pulmonary artery hypertension. Below the diaphragm, there changes of cirrhosis with borderline splenomegaly. Probable left renal cysts. 6. 3.7 cm infrarenal abdominal aortic aneurysm. No evidence of rupture.   Electronically Signed   By: Lajean Manes M.D.   On: 05/30/2014 22:06   Ct Cervical Spine Wo Contrast  05/30/2014   CLINICAL DATA:  Right neck pain after falling today and hitting his shoulder against a door jamb.  EXAM: CT HEAD WITHOUT CONTRAST  CT CERVICAL SPINE WITHOUT CONTRAST  TECHNIQUE: Multidetector CT imaging of the head and cervical spine was performed following the standard protocol without intravenous contrast. Multiplanar CT image reconstructions of the cervical spine were also generated.  COMPARISON:  10/01/2013.  FINDINGS: CT HEAD FINDINGS  Diffusely enlarged ventricles and subarachnoid spaces. Patchy white matter low density in both cerebral hemispheres. No skull fracture, intracranial hemorrhage or paranasal sinus air-fluid levels. Dense arterial calcifications at the skull base.  CT CERVICAL SPINE FINDINGS  Extensive multilevel cervical spine degenerative changes. No prevertebral soft tissue swelling, fractures or subluxations. Bilateral  vertebral and carotid artery atheromatous calcifications. Bullous changes at both lung apices.  IMPRESSION: 1. No skull fracture or intracranial hemorrhage. 2. No cervical spine fracture or subluxation. 3. Stable mid atrophy and chronic small vessel white matter ischemic changes. 4. Stable marked cervical spine degenerative changes. 5. COPD.   Electronically Signed   By: Enrique Sack M.D.   On: 05/30/2014 19:07   Ct Abdomen Pelvis W Contrast  05/30/2014   CLINICAL DATA:  [place] today. Complaining of right shoulder and left hip pain.  EXAM: CT CHEST, ABDOMEN, AND PELVIS WITH CONTRAST  TECHNIQUE: Multidetector CT imaging of the chest, abdomen and pelvis was performed following the standard protocol during bolus administration of intravenous contrast.  CONTRAST:  129mL  OMNIPAQUE IOHEXOL 300 MG/ML  SOLN  COMPARISON:  Chest CT, 08/30/2013.  FINDINGS: CT CHEST FINDINGS  Heart is mildly enlarged. There are dense coronary artery and mitral valve annular calcifications. Aorta is normal in caliber. There is partly calcified mild plaque along the thoracic aorta and at the origin of the left subclavian artery. No significant stenosis.  Right left pulmonary arteries are dilated to 3 cm, stable.  Borderline enlarged right peritracheal lymph node measures 1 cm short axis, stable. No mediastinal masses. No hilar masses or adenopathy.  Lungs show heterogeneous coarse peripheral reticular opacities consistent with interstitial fibrosis, with a pattern suggesting UIP. Focal opacity noted in the left lower lobe on the prior exam measuring 15 mm x 7 mm, is without significant change. No acute lung consolidation or evidence of edema. No pleural effusion or pneumothorax.  CT ABDOMEN AND PELVIS FINDINGS  Liver shows morphologic changes consistent with cirrhosis. No liver mass or focal lesion. No contusion or laceration.  Mean is prominent measuring 12.4 cm in greatest dimension. No splenic mass or focal lesion.  Gallbladder is  unremarkable. No bile duct dilation. No pancreatic masses or inflammation. No adrenal masses.  Mild bilateral renal cortical thinning. There is a peripherally calcified hypo attenuating, 3.1 cm mass protruding from the inferior margin of the left kidney, not imaged on the prior chest CT. This is likely a mildly complicated cyst. There is a low attenuation lesion in the upper pole left kidney, most likely a simple cyst. No other renal masses or lesions. No hydronephrosis. Normal ureters. Bladder is unremarkable.  There is an infrarenal abdominal aortic aneurysm measuring 3.7 cm in greatest anterior-posterior dimension. No evidence of rupture. Atherosclerotic changes noted along the abdominal aorta and its branch vessels.  No pathologically enlarged lymph nodes. No abnormal fluid collections. Specifically no evidence of hemo peritoneum.  No bowel wall thickening to suggest a bowel hematoma. No bowel inflammatory changes. No mesenteric inflammation or contusion/hematoma.  MUSCULOSKELETAL:  There fractures of the right acetabulum. Fracture extends across the posterior column, extending across the medial acetabulum to the anterior,. Fracture is mildly distracted by 7 mm. There is mild fracture comminution with small fracture fragments noted between the major fracture components, at least 1 within the hip joint.  There is a fracture of the left femoral neck. This is subcapital, oblique and non comminuted. Fracture is displaced with the distal fracture component migrating superiorly by 17 mm. There is also mild varus angulation as well as significant apex anterior angulation.  No other pelvic or proximal femur fractures.  There are no other fractures.  IMPRESSION: 1. Fractures of the right acetabulum involving the anterior posterior columns. 2. Fracture of the left femoral neck, mildly displaced and angulated. 3. No other fractures. 4. No other acute findings in the chest, abdomen or pelvis. No other evidence of acute  injury. 5. Chronic findings include cardiomegaly, coronary artery calcifications and changes consistent with interstitial fibrosis. Pulmonary artery dilation suggest pulmonary artery hypertension. Below the diaphragm, there changes of cirrhosis with borderline splenomegaly. Probable left renal cysts. 6. 3.7 cm infrarenal abdominal aortic aneurysm. No evidence of rupture.   Electronically Signed   By: Lajean Manes M.D.   On: 05/30/2014 22:06   Pelvis Portable  06/01/2014   CLINICAL DATA:  Postop left total hip.  EXAM: PORTABLE PELVIS 1-2 VIEWS  COMPARISON:  05/31/2014  FINDINGS: Patient has undergone total hip arthroplasty. Left hip prosthesis appears well seated. No evidence for dislocation on this frontal view. There is  asymmetry of the SI joint, likely related to pelvic fractures. Right acetabular fracture again noted. There is deformity of the right inferior pubic ramus.  IMPRESSION: 1. Status post left total hip arthroplasty. 2. No evidence for dislocation on this frontal view.   Electronically Signed   By: Shon Hale M.D.   On: 06/01/2014 17:07   Dg Pelvis Portable  05/30/2014   CLINICAL DATA:  [place] today at 3 o'clock. Tripped over a door way. Left hip pain.  EXAM: PORTABLE PELVIS 1-2 VIEWS PORTABLE ONE-VIEW CHEST X-RAY.  COMPARISON:  HIP RADIOGRAPHS 10/01/2013 AND CHEST X-RAY 08/26/2013.  FINDINGS: CHEST X-RAY:  The heart is mildly enlarged but stable. There is tortuosity and calcification of the thoracic aorta. Chronic lung changes with pulmonary fibrosis. No definite acute overlying pulmonary process. The bony thorax is grossly intact.  Pelvis:  There is a displaced left femoral neck fracture. The right hip is intact. Moderate degenerative changes bilaterally, right greater than left. The pubic symphysis and SI joints are intact. No pelvic fractures. Extensive vascular calcifications are noted.  IMPRESSION: 1. Displaced left femoral neck fracture. 2. No acute cardiopulmonary findings. Chronic lung  changes/pulmonary fibrosis.   Electronically Signed   By: Kalman Jewels M.D.   On: 05/30/2014 17:36   Dg Pelvis Comp Min 3v  05/31/2014   CLINICAL DATA:  Right acetabular fracture  EXAM: JUDET PELVIS - 3+ VIEW  COMPARISON:  05/30/2014  FINDINGS: Again noted changes consistent with a left femoral neck fracture with impaction. The right acetabular fracture is again noted and unchanged. No right femoral abnormality seen.  IMPRESSION: Right acetabular fracture stable from the prior exam.  Left femoral neck fracture stable from the prior exam.   Electronically Signed   By: Inez Catalina M.D.   On: 05/31/2014 11:34   Ct 3d Recon At Scanner  05/31/2014   CLINICAL DATA:  Nonspecific (abnormal) findings on radiological and other examination of musculoskeletal system. Right acetabular fracture. Left femoral neck fracture.  EXAM: 3-DIMENSIONAL CT IMAGE RENDERING ON ACQUISITION WORKSTATION  TECHNIQUE: 3-dimensional CT images were rendered by post-processing of the original CT data on an acquisition workstation.  COMPARISON:  CT scan and radiographs dated 05/30/2014  FINDINGS: There is a oblique vertical fracture through the medial aspect of the right acetabulum the with approximately 8 mm of distraction. There is also a subtle old healed fracture of the right inferior pubic ramus. I do not think these are acute.  There is an acute subcapital fracture of the left femoral neck with impaction and angulation. There are slight arthritic changes of the superior lateral aspect of the left acetabulum.  The 3D images better demonstrate the right acetabular fracture.  IMPRESSION: Slightly distracted subacute fracture through the medial aspect of the right acetabulum. Old healed fracture of the low right inferior pubic ramus.   Electronically Signed   By: Rozetta Nunnery M.D.   On: 05/31/2014 11:37   Dg Chest Port 1 View  05/31/2014   CLINICAL DATA:  80 year old male with low oxygen level. Subsequent encounter.  EXAM: PORTABLE  CHEST - 1 VIEW  COMPARISON:  05/30/2014 and 08/26/2013.  FINDINGS: Pulmonary vascular congestion superimposed upon chronic lung changes.  Elevated left hemidiaphragm with left base subsegmental atelectasis.  No gross pneumothorax.  Cardiomegaly.  Calcified mildly tortuous aorta.  IMPRESSION: Pulmonary vascular congestion superimposed upon chronic lung changes.  Elevated left hemidiaphragm with left base subsegmental atelectasis.  No gross pneumothorax.  Cardiomegaly.  Calcified mildly tortuous aorta.   Electronically Signed  By: Chauncey Cruel M.D.   On: 05/31/2014 06:55   Dg Chest Port 1 View  05/30/2014   CLINICAL DATA:  80 year old male who fell at 1500 hr today tripping over door way. Acute pain. Initial encounter.  EXAM: PORTABLE CHEST - 1 VIEW  COMPARISON:  Chest CT 08/30/2013 and earlier.  FINDINGS: Portable AP semi upright view at 1716 hr. Lower lung volumes. Stable cardiomegaly and mediastinal contours. Peripheral increased opacity greater in the left lung is stable. No pneumothorax, pulmonary edema, or pleural effusion identified. No acute pulmonary opacity identified. Calcified atherosclerosis of the aorta. Visualized tracheal air column is within normal limits. No acute osseous abnormality identified.  IMPRESSION: Cardiomegaly and chronic lung disease. No superimposed acute findings are identified.   Electronically Signed   By: Lars Pinks M.D.   On: 05/30/2014 17:35     ASSESSMENT/PLAN:  Left hip fracture status post bipolar arthroplasty -  for continued rehabilitation at [facility]. Diabetes mellitus, type II - continue Glucotrol XL 10 mg by mouth daily and and Glucophage XR at 1000 mg by mouth twice a day BPH - continue Proscar 5 mg by mouth daily History of atrial fibrillation status post ablation - rate controlled; continue Betapace Hyperlipidemia - continue Lipitor 40 mg by mouth daily Anemia, acute blood loss - stable; hemoglobin 11.3 Constipation - continue Colace 100 mg by mouth twice  a day, MiraLAX 17 g +4-6 ounces liquid by mouth daily and senna S2 tabs by mouth daily at bedtime Vertigo - continue Antivert 25 mg 1 by mouth every morning and 4 PM AAA - vascular consult; family will make arrangement for vascular consult Tobacco abuse - continue nicotine patch 14 mg/24 hour 1 patch transdermally daily    Total discharge time: Less than 30 minutes  Discharge time involved coordination of the discharge process with Education officer, museum, nursing staff and therapy department.     [facility], NP [employer] [phone]

## 2014-08-11 ENCOUNTER — Encounter (HOSPITAL_COMMUNITY): Payer: Self-pay | Admitting: Orthopedic Surgery

## 2015-04-26 ENCOUNTER — Encounter (HOSPITAL_COMMUNITY): Payer: Self-pay | Admitting: Emergency Medicine

## 2015-04-26 ENCOUNTER — Inpatient Hospital Stay (HOSPITAL_COMMUNITY): Payer: Medicare Other

## 2015-04-26 ENCOUNTER — Inpatient Hospital Stay (HOSPITAL_COMMUNITY)
Admission: EM | Admit: 2015-04-26 | Discharge: 2015-05-04 | DRG: 180 | Disposition: A | Discharge status: Skilled Nursing Facility | Payer: Medicare Other | Attending: Internal Medicine | Admitting: Internal Medicine

## 2015-04-26 ENCOUNTER — Emergency Department (HOSPITAL_COMMUNITY): Payer: Medicare Other

## 2015-04-26 DIAGNOSIS — Z800 Family history of malignant neoplasm of digestive organs: Secondary | ICD-10-CM | POA: Diagnosis not present

## 2015-04-26 DIAGNOSIS — Z96642 Presence of left artificial hip joint: Secondary | ICD-10-CM | POA: Diagnosis present

## 2015-04-26 DIAGNOSIS — E7800 Pure hypercholesterolemia, unspecified: Secondary | ICD-10-CM | POA: Diagnosis present

## 2015-04-26 DIAGNOSIS — J939 Pneumothorax, unspecified: Secondary | ICD-10-CM | POA: Insufficient documentation

## 2015-04-26 DIAGNOSIS — L538 Other specified erythematous conditions: Secondary | ICD-10-CM | POA: Diagnosis not present

## 2015-04-26 DIAGNOSIS — E785 Hyperlipidemia, unspecified: Secondary | ICD-10-CM | POA: Diagnosis present

## 2015-04-26 DIAGNOSIS — Z66 Do not resuscitate: Secondary | ICD-10-CM | POA: Diagnosis present

## 2015-04-26 DIAGNOSIS — J9601 Acute respiratory failure with hypoxia: Secondary | ICD-10-CM | POA: Diagnosis present

## 2015-04-26 DIAGNOSIS — Z825 Family history of asthma and other chronic lower respiratory diseases: Secondary | ICD-10-CM

## 2015-04-26 DIAGNOSIS — D649 Anemia, unspecified: Secondary | ICD-10-CM | POA: Diagnosis present

## 2015-04-26 DIAGNOSIS — J90 Pleural effusion, not elsewhere classified: Secondary | ICD-10-CM | POA: Diagnosis present

## 2015-04-26 DIAGNOSIS — J8410 Pulmonary fibrosis, unspecified: Secondary | ICD-10-CM | POA: Diagnosis present

## 2015-04-26 DIAGNOSIS — Z8679 Personal history of other diseases of the circulatory system: Secondary | ICD-10-CM

## 2015-04-26 DIAGNOSIS — Z7984 Long term (current) use of oral hypoglycemic drugs: Secondary | ICD-10-CM | POA: Diagnosis not present

## 2015-04-26 DIAGNOSIS — J948 Other specified pleural conditions: Secondary | ICD-10-CM | POA: Diagnosis not present

## 2015-04-26 DIAGNOSIS — J9690 Respiratory failure, unspecified, unspecified whether with hypoxia or hypercapnia: Secondary | ICD-10-CM

## 2015-04-26 DIAGNOSIS — I714 Abdominal aortic aneurysm, without rupture: Secondary | ICD-10-CM | POA: Diagnosis present

## 2015-04-26 DIAGNOSIS — R0602 Shortness of breath: Secondary | ICD-10-CM | POA: Diagnosis present

## 2015-04-26 DIAGNOSIS — H353290 Exudative age-related macular degeneration, unspecified eye, stage unspecified: Secondary | ICD-10-CM | POA: Diagnosis present

## 2015-04-26 DIAGNOSIS — R918 Other nonspecific abnormal finding of lung field: Secondary | ICD-10-CM | POA: Diagnosis not present

## 2015-04-26 DIAGNOSIS — F17210 Nicotine dependence, cigarettes, uncomplicated: Secondary | ICD-10-CM | POA: Diagnosis present

## 2015-04-26 DIAGNOSIS — I4891 Unspecified atrial fibrillation: Secondary | ICD-10-CM | POA: Diagnosis present

## 2015-04-26 DIAGNOSIS — R0902 Hypoxemia: Secondary | ICD-10-CM

## 2015-04-26 DIAGNOSIS — Z7189 Other specified counseling: Secondary | ICD-10-CM | POA: Diagnosis not present

## 2015-04-26 DIAGNOSIS — E118 Type 2 diabetes mellitus with unspecified complications: Secondary | ICD-10-CM | POA: Diagnosis not present

## 2015-04-26 DIAGNOSIS — J910 Malignant pleural effusion: Secondary | ICD-10-CM | POA: Diagnosis present

## 2015-04-26 DIAGNOSIS — J9383 Other pneumothorax: Secondary | ICD-10-CM | POA: Diagnosis not present

## 2015-04-26 DIAGNOSIS — C7800 Secondary malignant neoplasm of unspecified lung: Secondary | ICD-10-CM | POA: Diagnosis present

## 2015-04-26 DIAGNOSIS — L8990 Pressure ulcer of unspecified site, unspecified stage: Secondary | ICD-10-CM | POA: Insufficient documentation

## 2015-04-26 DIAGNOSIS — E119 Type 2 diabetes mellitus without complications: Secondary | ICD-10-CM

## 2015-04-26 DIAGNOSIS — C801 Malignant (primary) neoplasm, unspecified: Secondary | ICD-10-CM | POA: Diagnosis present

## 2015-04-26 DIAGNOSIS — E1140 Type 2 diabetes mellitus with diabetic neuropathy, unspecified: Secondary | ICD-10-CM | POA: Diagnosis present

## 2015-04-26 DIAGNOSIS — I10 Essential (primary) hypertension: Secondary | ICD-10-CM | POA: Diagnosis present

## 2015-04-26 DIAGNOSIS — C3490 Malignant neoplasm of unspecified part of unspecified bronchus or lung: Secondary | ICD-10-CM | POA: Diagnosis present

## 2015-04-26 DIAGNOSIS — J984 Other disorders of lung: Secondary | ICD-10-CM | POA: Diagnosis not present

## 2015-04-26 DIAGNOSIS — K5900 Constipation, unspecified: Secondary | ICD-10-CM | POA: Diagnosis present

## 2015-04-26 DIAGNOSIS — Z4682 Encounter for fitting and adjustment of non-vascular catheter: Secondary | ICD-10-CM

## 2015-04-26 HISTORY — DX: Benign prostatic hyperplasia without lower urinary tract symptoms: N40.0

## 2015-04-26 HISTORY — DX: Aortic aneurysm of unspecified site, without rupture: I71.9

## 2015-04-26 HISTORY — DX: Pleural effusion, not elsewhere classified: J90

## 2015-04-26 HISTORY — DX: Reserved for inherently not codable concepts without codable children: IMO0001

## 2015-04-26 HISTORY — DX: Unspecified osteoarthritis, unspecified site: M19.90

## 2015-04-26 LAB — CBC
HCT: 36.2 % — ABNORMAL LOW (ref 39.0–52.0)
Hemoglobin: 11.9 g/dL — ABNORMAL LOW (ref 13.0–17.0)
MCH: 29.2 pg (ref 26.0–34.0)
MCHC: 32.9 g/dL (ref 30.0–36.0)
MCV: 88.7 fL (ref 78.0–100.0)
PLATELETS: 178 10*3/uL (ref 150–400)
RBC: 4.08 MIL/uL — ABNORMAL LOW (ref 4.22–5.81)
RDW: 14.2 % (ref 11.5–15.5)
WBC: 5.8 10*3/uL (ref 4.0–10.5)

## 2015-04-26 LAB — BODY FLUID CELL COUNT WITH DIFFERENTIAL
EOS FL: 0 %
Lymphs, Fluid: 90 %
MONOCYTE-MACROPHAGE-SEROUS FLUID: 10 % — AB (ref 50–90)
NEUTROPHIL FLUID: 0 % (ref 0–25)
Total Nucleated Cell Count, Fluid: 2700 cu mm — ABNORMAL HIGH (ref 0–1000)

## 2015-04-26 LAB — BASIC METABOLIC PANEL
Anion gap: 10 (ref 5–15)
BUN: 16 mg/dL (ref 6–20)
CO2: 25 mmol/L (ref 22–32)
CREATININE: 1.09 mg/dL (ref 0.61–1.24)
Calcium: 8.6 mg/dL — ABNORMAL LOW (ref 8.9–10.3)
Chloride: 99 mmol/L — ABNORMAL LOW (ref 101–111)
GFR calc Af Amer: >60 mL/min (ref >60)
GFR calc non Af Amer: 59 mL/min — ABNORMAL LOW (ref >60)
GLUCOSE: 186 mg/dL — AB (ref 65–99)
Potassium: 4.9 mmol/L (ref 3.5–5.1)
Sodium: 134 mmol/L — ABNORMAL LOW (ref 135–145)

## 2015-04-26 LAB — HEMATOCRIT, BODY FLUID: HEMATOCRIT FL: 1.5 %

## 2015-04-26 LAB — PROTEIN, BODY FLUID: Total protein, fluid: 4 g/dL

## 2015-04-26 LAB — LACTATE DEHYDROGENASE, PLEURAL OR PERITONEAL FLUID: LD FL: 218 U/L — AB (ref 3–23)

## 2015-04-26 LAB — GLUCOSE, CAPILLARY
GLUCOSE-CAPILLARY: 195 mg/dL — AB (ref 65–99)
Glucose-Capillary: 108 mg/dL — ABNORMAL HIGH (ref 65–99)

## 2015-04-26 LAB — PROTIME-INR
INR: 1.06 (ref 0.00–1.49)
Prothrombin Time: 14 seconds (ref 11.6–15.2)

## 2015-04-26 LAB — PROTEIN, TOTAL: Total Protein: 6.2 g/dL — ABNORMAL LOW (ref 6.5–8.1)

## 2015-04-26 LAB — BRAIN NATRIURETIC PEPTIDE: B NATRIURETIC PEPTIDE 5: 150.1 pg/mL — AB (ref 0.0–100.0)

## 2015-04-26 LAB — LACTATE DEHYDROGENASE: LDH: 128 U/L (ref 98–192)

## 2015-04-26 LAB — CHOLESTEROL, TOTAL: CHOLESTEROL: 98 mg/dL (ref 0–200)

## 2015-04-26 LAB — PROCALCITONIN

## 2015-04-26 MED ORDER — FINASTERIDE 5 MG PO TABS
5.0000 mg | ORAL_TABLET | Freq: Every day | ORAL | Status: DC
  Administered 2015-04-26 – 2015-05-04 (×9): 5 mg via ORAL
  Filled 2015-04-26 (×9): qty 1

## 2015-04-26 MED ORDER — ALBUTEROL SULFATE (2.5 MG/3ML) 0.083% IN NEBU: 2.5000 mg | INHALATION_SOLUTION | RESPIRATORY_TRACT | Status: DC | PRN

## 2015-04-26 MED ORDER — ONDANSETRON HCL 4 MG PO TABS: 4.0000 mg | ORAL_TABLET | Freq: Four times a day (QID) | ORAL | Status: DC | PRN

## 2015-04-26 MED ORDER — ONDANSETRON HCL 4 MG/2ML IJ SOLN
4.0000 mg | Freq: Four times a day (QID) | INTRAMUSCULAR | Status: DC | PRN
  Administered 2015-04-28: 4 mg via INTRAVENOUS
  Filled 2015-04-26: qty 2

## 2015-04-26 MED ORDER — INSULIN ASPART 100 UNIT/ML ~~LOC~~ SOLN
0.0000 [IU] | Freq: Three times a day (TID) | SUBCUTANEOUS | Status: DC
  Administered 2015-04-27: 1 [IU] via SUBCUTANEOUS
  Administered 2015-04-27: 2 [IU] via SUBCUTANEOUS
  Administered 2015-04-27: 1 [IU] via SUBCUTANEOUS

## 2015-04-26 MED ORDER — SENNA 8.6 MG PO TABS
1.0000 | ORAL_TABLET | Freq: Two times a day (BID) | ORAL | Status: DC
  Administered 2015-04-26 – 2015-05-04 (×15): 8.6 mg via ORAL
  Filled 2015-04-26 (×16): qty 1

## 2015-04-26 MED ORDER — SODIUM CHLORIDE 0.9 % IV SOLN: 250.0000 mL | INTRAVENOUS | Status: DC | PRN

## 2015-04-26 MED ORDER — INFLUENZA VAC SPLIT QUAD 0.5 ML IM SUSY
0.5000 mL | PREFILLED_SYRINGE | INTRAMUSCULAR | Status: AC
  Administered 2015-04-27: 0.5 mL via INTRAMUSCULAR
  Filled 2015-04-26: qty 0.5

## 2015-04-26 MED ORDER — ACETAMINOPHEN 325 MG PO TABS
650.0000 mg | ORAL_TABLET | Freq: Four times a day (QID) | ORAL | Status: DC | PRN
  Administered 2015-04-26 – 2015-05-03 (×9): 650 mg via ORAL
  Filled 2015-04-26 (×11): qty 2

## 2015-04-26 MED ORDER — GUAIFENESIN-DM 100-10 MG/5ML PO SYRP
5.0000 mL | ORAL_SOLUTION | ORAL | Status: DC | PRN
Start: 2015-04-26 — End: 2015-05-04
  Administered 2015-04-27: 5 mL via ORAL
  Filled 2015-04-26: qty 5

## 2015-04-26 MED ORDER — SODIUM CHLORIDE 0.9 % IJ SOLN: 3.0000 mL | INTRAMUSCULAR | Status: DC | PRN

## 2015-04-26 MED ORDER — SOTALOL HCL 80 MG PO TABS
80.0000 mg | ORAL_TABLET | Freq: Two times a day (BID) | ORAL | Status: DC
  Administered 2015-04-26 – 2015-05-04 (×15): 80 mg via ORAL
  Filled 2015-04-26 (×20): qty 1

## 2015-04-26 MED ORDER — ACETAMINOPHEN 650 MG RE SUPP: 650.0000 mg | Freq: Four times a day (QID) | RECTAL | Status: DC | PRN

## 2015-04-26 MED ORDER — SODIUM CHLORIDE 0.9 % IJ SOLN
3.0000 mL | Freq: Two times a day (BID) | INTRAMUSCULAR | Status: DC
  Administered 2015-04-26 – 2015-05-04 (×12): 3 mL via INTRAVENOUS

## 2015-04-26 MED ORDER — TRAMADOL HCL 50 MG PO TABS
50.0000 mg | ORAL_TABLET | Freq: Every morning | ORAL | Status: DC
  Administered 2015-04-27 – 2015-05-04 (×8): 50 mg via ORAL
  Filled 2015-04-26 (×9): qty 1

## 2015-04-26 NOTE — ED Provider Notes (Addendum)
CSN: [id number]     Arrival date & time 04/26/15  1345 History   First MD Initiated Contact with Patient 04/26/15 1416     Chief Complaint  Patient presents with  . Shortness of Breath     (Consider location/radiation/quality/duration/timing/severity/associated sxs/prior Treatment) HPI Comments: 81 year old male with extensive past medical history including diabetes, hypertension, atrial fibrillation, abdominal aortic aneurysm, pulmonary fibrosis who presents with shortness of breath. History obtained from the patient's daughter and son-in-law who are at bedside. They state that 2 weeks ago, the patient had a tooth extraction and several days later began having alternating constipation and diarrhea for approximately one week. 2 days ago, the patient began complaining of dizziness which was worse when he stood up and he intermittently grabbed his chest acting like he was short of breath. He was placed on oxygen and family feels like his symptoms improved while on oxygen. However, he has continued to intermittently be short of breath and a portable chest x-ray at his nursing facility showed a large left pleural effusion. He was scheduled for follow-up tomorrow but family feels that his symptoms have been too severe to wait. He has not had any fevers or significant cough. No history of heart failure. No weight loss or night sweats.  Patient is a 81 y.o. male presenting with shortness of breath. The history is provided by a relative.  Shortness of Breath   Past Medical History  Diagnosis Date  . Diabetes   . Atrial fibrillation   . Gynecomastia   . RBBB   . High cholesterol   . Hiatal hernia   . AAA (abdominal aortic aneurysm)   . Heart murmur   . Neuropathic impotence   . Neuropathy   . Macular degeneration, wet   . Pulmonary fibrosis   . Liver disease   . Left displaced femoral neck fracture 05/30/2014  . Elevated troponin    Past Surgical History  Procedure Laterality Date  .  Colostomy  1960  . Shoulder surgery Right   . Nose surgery      multiple times--broken nose/lacerations  . Hip arthroplasty Left 06/01/2014    Procedure: ARTHROPLASTY BIPOLAR HIP;  Surgeon: Johnny Bridge, MD;  Location: [facility];  Service: Orthopedics;  Laterality: Left;   Family History  Problem Relation Age of Onset  . Rectal cancer Father   . COPD Brother    Social History  Substance Use Topics  . Smoking status: Current Every Day Smoker -- 0.50 packs/day for 63 years    Types: Cigarettes  . Smokeless tobacco: None     Comment: smoke about 14-15 cigs qd  . Alcohol Use: No     Comment: quit 8 years ago    Review of Systems  Respiratory: Positive for shortness of breath.    10 Systems reviewed and are negative for acute change except as noted in the HPI.    Allergies  Review of patient's allergies indicates no known allergies.  Home Medications   Prior to Admission medications   Medication Sig Start Date End Date Taking? Authorizing Provider  acetaminophen (TYLENOL) 325 MG tablet Take 2 tablets (650 mg total) by mouth every 6 (six) hours as needed. 06/01/14   Marchia Bond, MD  antiseptic oral rinse (BIOTENE) LIQD 15 mLs by Mouth Rinse route as needed for dry mouth.    Historical Provider, MD  docusate sodium (COLACE) 100 MG capsule Take 100 mg by mouth 2 (two) times daily as needed for mild constipation.  Historical Provider, MD  enoxaparin (LOVENOX) 40 MG/0.4ML injection Inject 0.4 mLs (40 mg total) into the skin daily. For 3 weeks 06/06/14   Domenic Polite, MD  finasteride (PROSCAR) 5 MG tablet Take 5 mg by mouth daily.    Historical Provider, MD  glipiZIDE (GLUCOTROL XL) 10 MG 24 hr tablet Take 1 tablet (10 mg total) by mouth daily with breakfast. 06/06/14   Domenic Polite, MD  Glucosamine-Chondroit-Vit C-Mn (GLUCOSAMINE 1500 COMPLEX PO) Take 1,500 mg by mouth 2 (two) times daily.     Historical Provider, MD  guaiFENesin (MUCINEX) 600 MG 12 hr tablet Take 600 mg by mouth  daily as needed for to loosen phlegm.    Historical Provider, MD  HYDROcodone-acetaminophen (NORCO/VICODIN) 5-325 MG per tablet Take one tablet by mouth every 6 hours as needed for severe pain. Do not exceed 4gm of Tylenol in 24 hours. 06/08/14   Estill Dooms, MD  metFORMIN (GLUCOPHAGE-XR) 500 MG 24 hr tablet Take 1,000 mg by mouth 2 (two) times daily.     Historical Provider, MD  Multiple Vitamins-Minerals (EYE VITAMINS PO) Take 1 tablet by mouth daily. SMOTHERS FORMULA    Historical Provider, MD  Omega-3 Fatty Acids (FISH OIL) 1200 MG CAPS Take 1,200 mg by mouth 2 (two) times daily.     Historical Provider, MD  rosuvastatin (CRESTOR) 10 MG tablet Take 10 mg by mouth daily.    Historical Provider, MD  senna (SENOKOT) 8.6 MG TABS tablet Take 1 tablet (8.6 mg total) by mouth 2 (two) times daily. 06/06/14   Domenic Polite, MD  sotalol (BETAPACE) 80 MG tablet Take 80 mg by mouth 2 (two) times daily.    Historical Provider, MD  traMADol (ULTRAM) 50 MG tablet Take one tablet by mouth every morning; Take one tablet by mouth every night at bedtime as needed for pain 06/09/14   Lauree Chandler, NP   BP 139/60 mmHg  Pulse 63  Temp(Src) 98.4 F (36.9 C) (Oral)  Resp 18  Ht '6\' 1"'$  (1.854 m)  Wt 192 lb (87.091 kg)  BMI 25.34 kg/m2  SpO2 98% Physical Exam  Constitutional:  Chronically ill appearing, mild dyspnea but NAD  HENT:  Head: Normocephalic and atraumatic.  Mouth/Throat: Oropharynx is clear and moist.  Moist mucous membranes  Eyes: Conjunctivae are normal. Pupils are equal, round, and reactive to light.  Neck: Neck supple.  Cardiovascular: Normal rate and regular rhythm.   3/6 systolic murmur  Pulmonary/Chest:  Mildly increased WOB on 4L [place], severely diminished BS left upper lung with absent BS in base; R lung with high-pitched crackles  Abdominal: Soft. Bowel sounds are normal. He exhibits no distension. There is no tenderness.  Musculoskeletal: He exhibits no edema.  Neurological: He  is alert.  Oriented to person, follows basic commands, mild confusion  Skin: Skin is warm and dry.  Psychiatric: He has a normal mood and affect. Judgment normal.  Nursing note and vitals reviewed.   ED Course  Procedures (including critical care time) Labs Review Labs Reviewed  BASIC METABOLIC PANEL - Abnormal; Notable for the following:    Sodium 134 (*)    Chloride 99 (*)    Glucose, Bld 186 (*)    Calcium 8.6 (*)    GFR calc non Af Amer 59 (*)    All other components within normal limits  CBC - Abnormal; Notable for the following:    RBC 4.08 (*)    Hemoglobin 11.9 (*)    HCT 36.2 (*)  All other components within normal limits  BRAIN NATRIURETIC PEPTIDE    Imaging Review Dg Chest 2 View  04/26/2015   CLINICAL DATA:  Shortness of breath, onset 3-4 days ago.  EXAM: CHEST  2 VIEW  COMPARISON:  Chest x-ray 06/03/2014  FINDINGS: There is a large left pleural effusion with near complete opacification of the left hemithorax. Probable underlying COPD. Scarring noted in the right base. There is cardiomegaly. Mitral valve annular calcifications noted. No acute bony abnormality.  IMPRESSION: Large left pleural effusion with near complete opacification of the left hemithorax.  COPD.  Right basilar scarring.  Cardiomegaly.   Electronically Signed   By: Rolm Baptise M.D.   On: 04/26/2015 15:40   I have personally reviewed and evaluated these images and lab results as part of my medical decision-making.   EKG Interpretation   Date/Time:  Wednesday April 26 2015 13:46:44 EDT Ventricular Rate:  60 PR Interval:  260 QRS Duration: 162 QT Interval:  476 QTC Calculation: 476 R Axis:   -98 Text Interpretation:  Sinus rhythm Ventricular premature complex Prolonged  PR interval Nonspecific IVCD with LAD Baseline wander in lead(s) I III aVL  Confirmed by LITTLE MD, RACHEL ([id number]) on 04/26/2015 2:56:12 PM      MDM   Final diagnoses:  Pleural effusion, left  Hypoxia     81 year old male who presents with 2 days of worsening intermittent episodes of shortness of breath as well as hypoxia requiring oxygen at his nursing facility. Recent portable chest x-ray showed a large left pleural effusion. On arrival, the patient was awake, alert, mildly dyspneic but in no acute distress. O2 sats 96-98% on 4L [place]. Almost absent breath sounds on left lung. Obtained basic labs which show mild anemia, normal creatinine. EKG without ischemic changes. CXR shows significant L pleural effusion. DDx is broad and includes malignant effusion given smoking history, heart failure, or empyema although infection seems less likely given patient is afebrile and non-toxic. Patient will likely require thoracentesis both therapeutically and for diagnostic testing. Patient will be admitted to general medicine for treatment of effusion and further work up.    Sharlett Iles, MD 04/26/15 [place], MD 04/26/15 [phone]

## 2015-04-26 NOTE — ED Notes (Signed)
Pulmonary NP at bedside.

## 2015-04-26 NOTE — Procedures (Signed)
Thoracentesis Procedure Note  Pre-operative Diagnosis: Left pleural effusion  Post-operative Diagnosis: same  Indications: L pleural effusion, dyspnea  Procedure Details  Consent: Informed consent was obtained. Risks of the procedure were discussed including: infection, bleeding, pain, pneumothorax.  Under sterile conditions the patient was positioned. Betadine solution and sterile drapes were utilized.  1% plain lidocaine was used to anesthetize the 6th rib space. Fluid was obtained without any difficulties and minimal blood loss.  A dressing was applied to the wound and wound care instructions were provided.   Findings 650 ml of dark bloody pleural fluid was obtained. A sample was sent to Pathology for cytogenetics, flow, and cell counts, as well as for infection analysis.  Complications:  None; patient tolerated the procedure well.          Condition: stable  Plan A follow up chest x-ray was ordered. Bed Rest for 1 hours. Tylenol 650 mg. for pain.  Georgann Housekeeper, AGACNP-BC [facility] Pulmonology/Critical Care Pager [phone] or [phone]  04/26/2015 7:22 PM

## 2015-04-26 NOTE — Progress Notes (Signed)
This RN assisted MD with thoracentesis.  Patient vital signs monitored q 58mn during procedure.  LLL lung sounds absent before procedure but diminished after.  Patient tolerated procedure well.  Now sitting on the side of bed eating dinner.  Report given to [place], RN who will continue to monitor patient.

## 2015-04-26 NOTE — ED Notes (Signed)
Onset 3-4 days ago shortness of breath placed on  4L per EMS at [facility]. Chest xray taken large left pleural effusion left lower lobe atelectasis or associated pneumonia. Alert answering and following commands appropriate.

## 2015-04-26 NOTE — ED Notes (Signed)
Admit Doctor at bedside.  

## 2015-04-26 NOTE — H&P (Signed)
PATIENT DETAILS Name: [name] [name] Age: 81 y.o. Sex: male Date of Birth: [birth year] Admit Date: 04/26/2015 HDQ:[id number],JJH [name] Moores, MD Referring Physician:Dr Little   CHIEF COMPLAINT:  Shortness of breath for 2 days   HPI: [name] [name] is a 81 y.o. male with a Past Medical History of atrial fibrillation on anticoagulation, history of pulmonary fibrosis, AAA, hypertension, diabetes who presents today with the above noted complaint. Patient currently is living in a skilled nursing facility for the past 10 months. Per family/patient-for the past 2 days he's had intermittent episodes of shortness of breath.Dyspnea is at both at rest and exertion. No fever or chest pain. No cough. A x-ray done as an outpatient showed pleural effusion, plans for thoracocentesis as an outpatient-however his shortness of breath worsened and was sent to the emergency room for further evaluation and treatment. A chest x-ray in the emergency room showed a large left pleural effusion with complete left hemithorax opacification. I was subsequently asked to admit this patient for further evaluation and treatment.   ALLERGIES:  No Known Allergies  PAST MEDICAL HISTORY: Past Medical History  Diagnosis Date  . Diabetes   . Atrial fibrillation   . Gynecomastia   . RBBB   . High cholesterol   . Hiatal hernia   . AAA (abdominal aortic aneurysm)   . Heart murmur   . Neuropathic impotence   . Neuropathy   . Macular degeneration, wet   . Pulmonary fibrosis   . Liver disease   . Left displaced femoral neck fracture 05/30/2014  . Elevated troponin     PAST SURGICAL HISTORY: Past Surgical History  Procedure Laterality Date  . Colostomy  1960  . Shoulder surgery Right   . Nose surgery      multiple times--broken nose/lacerations  . Hip arthroplasty Left 06/01/2014    Procedure: ARTHROPLASTY BIPOLAR HIP;  Surgeon: Johnny Bridge, MD;  Location: [facility];  Service: Orthopedics;  Laterality: Left;     MEDICATIONS AT HOME: Prior to Admission medications   Medication Sig Start Date End Date Taking? Authorizing Provider  finasteride (PROSCAR) 5 MG tablet Take 5 mg by mouth daily.    Historical Provider, MD  Glucosamine-Chondroit-Vit C-Mn (GLUCOSAMINE 1500 COMPLEX PO) Take 1,500 mg by mouth 2 (two) times daily.     Historical Provider, MD  Multiple Vitamins-Minerals (EYE VITAMINS PO) Take 1 tablet by mouth daily. SMOTHERS FORMULA    Historical Provider, MD  Omega-3 Fatty Acids (FISH OIL) 1200 MG CAPS Take 1,200 mg by mouth 2 (two) times daily.     Historical Provider, MD  rosuvastatin (CRESTOR) 10 MG tablet Take 10 mg by mouth daily.    Historical Provider, MD  senna (SENOKOT) 8.6 MG TABS tablet Take 1 tablet (8.6 mg total) by mouth 2 (two) times daily. 06/06/14   Domenic Polite, MD  sotalol (BETAPACE) 80 MG tablet Take 80 mg by mouth 2 (two) times daily.    Historical Provider, MD  traMADol (ULTRAM) 50 MG tablet Take one tablet by mouth every morning; Take one tablet by mouth every night at bedtime as needed for pain 06/09/14   Lauree Chandler, NP    FAMILY HISTORY: Family History  Problem Relation Age of Onset  . Rectal cancer Father   . COPD Brother     SOCIAL HISTORY:  reports that he has been smoking Cigarettes.  He has a 31.5 pack-year smoking history. He does not have any smokeless tobacco history on file.  He reports that he does not drink alcohol or use illicit drugs. Lives at: SNF Mobility: Cane/WalkerWheelchair  REVIEW OF SYSTEMS:  Constitutional:   No  weight loss, night sweats,  Fevers, chills, fatigue.  HEENT:    No headaches, Dysphagia,Tooth/dental problems,Sore throat,  No sneezing, itching, ear ache, nasal congestion, post nasal drip  Cardio-vascular: No chest pain,Orthopnea, PND,lower extremity edema, anasarca, palpitations  GI:  No heartburn, indigestion, abdominal pain, nausea, vomiting, diarrhea, melena or hematochezia  Resp: No shortness of breath,  cough, hemoptysis,plueritic chest pain.   Skin:  No rash or lesions.  GU:  No dysuria, change in color of urine, no urgency or frequency.  No flank pain.  Musculoskeletal: No joint pain or swelling.  No decreased range of motion.  No back pain.  Endocrine: No heat intolerance, no cold intolerance, no polyuria, no polydipsia  Psych: No change in mood or affect. No depression or anxiety.  No memory loss.   PHYSICAL EXAM: Blood pressure 138/71, pulse 63, temperature 98.4 F (36.9 C), temperature source Oral, resp. rate 18, height '6\' 1"'$  (1.854 m), weight 87.091 kg (192 lb), SpO2 97 %.  General appearance :Awake, alert, not in any distress. Speech Clear. Not toxic Looking HEENT: Atraumatic and Normocephalic, pupils equally reactive to light and accomodation Neck: supple, no JVD. No cervical lymphadenopathy.  Chest: Significant decrease in air entry on the left-air heard in the left upper lobes. Dull on percussion. Right lung clear on exam CVS: S1 S2 regular, no murmurs.  Abdomen: Bowel sounds present, Non tender and not distended with no gaurding, rigidity or rebound. Extremities: B/L Lower Ext shows no edema, both legs are warm to touch Neurology:  Non focal Skin:No Rash Wounds:N/A  LABS ON ADMISSION:   Recent Labs  04/26/15 1421  NA 134*  K 4.9  CL 99*  CO2 25  GLUCOSE 186*  BUN 16  CREATININE 1.09  CALCIUM 8.6*   No results for input(s): AST, ALT, ALKPHOS, BILITOT, PROT, ALBUMIN in the last 72 hours. No results for input(s): LIPASE, AMYLASE in the last 72 hours.  Recent Labs  04/26/15 1421  WBC 5.8  HGB 11.9*  HCT 36.2*  MCV 88.7  PLT 178   No results for input(s): CKTOTAL, CKMB, CKMBINDEX, TROPONINI in the last 72 hours. No results for input(s): DDIMER in the last 72 hours. Invalid input(s): POCBNP   RADIOLOGIC STUDIES ON ADMISSION: Dg Chest 2 View  04/26/2015   CLINICAL DATA:  Shortness of breath, onset 3-4 days ago.  EXAM: CHEST  2 VIEW  COMPARISON:   Chest x-ray 06/03/2014  FINDINGS: There is a large left pleural effusion with near complete opacification of the left hemithorax. Probable underlying COPD. Scarring noted in the right base. There is cardiomegaly. Mitral valve annular calcifications noted. No acute bony abnormality.  IMPRESSION: Large left pleural effusion with near complete opacification of the left hemithorax.  COPD.  Right basilar scarring.  Cardiomegaly.   Electronically Signed   By: Rolm Baptise M.D.   On: 04/26/2015 15:40    I have personally reviewed images of chest xray   EKG: Personally reviewed.NSR  ASSESSMENT AND PLAN: Present on Admission:  . Pleural effusion on left: PCCM consulted for both diagnostic and therapeutic paracentesis. Apparently has lost some weight over the past few months-obviously malignancy is a concern (prior history of smoking)  . Acute respiratory failure with hypoxia: Secondary to above, saturation in the high 90s on 4 L of oxygen via nasal cannula. In mild distress but  appears comfortable.   . Hyperlipidemia: Continue statin  . Atrophic fibrillation: Continue sotalol, suspect not a candidate for anticoagulation given advanced age, frailty and fall risk.  . Type 2 diabetes: Hold oral hypoglycemics, place and exercise. Follow.   Further plan will depend as patient's clinical course evolves and further radiologic and laboratory data become available. Patient will be monitored closely.  Above noted plan was discussed with patient/son in law face to face at bedside, they were in agreement.   CONSULTS: PCCM  DVT Prophylaxis: SCD's  Code Status: DNR-reconfirmed with patient/son in law-out of facility golden yellow DNR form present as well.  Disposition Plan:  Discharge back to SNF possibly in 2-3 days   Total time spent  55 minutes.Greater than 50% of this time was spent in counseling, explanation of diagnosis, planning of further management, and coordination of  care.  [facility] Pager [phone]  If 7PM-7AM, please contact night-coverage [email or web address] Password [facility] 04/26/2015, 4:49 PM

## 2015-04-26 NOTE — Consult Note (Signed)
Name: [name] [name] MRN: [id number] DOB: 22-Jul-1927    ADMISSION DATE:  04/26/2015 CONSULTATION DATE:  9/28  REFERRING MD :  Sloan Leiter   CHIEF COMPLAINT:  Pleural effusion   BRIEF PATIENT DESCRIPTION: 81yo male SNF resident with hx AFib (on NO anticoagulation r/t multiple falls) pulmonary fibrosis, HTN, DM presented 9/28 with 2 day hx intermittent "episodes" of significant SOB. An outpt CXR revealed mod pleural effusion on 9/27 per son with plans for outpt thoracentesis but dyspnea worsened and he was sent to ER.  CXR now reveals complete opacification L hemithorax.  PCCM consulted for thoracentesis.   SIGNIFICANT EVENTS    STUDIES:     HISTORY OF PRESENT ILLNESS:  81yo male SNF resident with hx AFib (on NO anticoagulation r/t multiple falls) pulmonary fibrosis, HTN, DM presented 9/28 with 2 day hx intermittent "episodes" of significant SOB. An outpt CXR revealed mod pleural effusion on 9/27 per son with plans for outpt thoracentesis but dyspnea worsened and he was sent to ER.  CXR now reveals complete opacification L hemithorax.  PCCM consulted for thoracentesis.   Pt has frequent falls, last fall was ~1 week ago but fell on his RIGHT side.   Denies fever, chills, chest pain, cough, purulent sputum.    PAST MEDICAL HISTORY :   has a past medical history of Diabetes; Atrial fibrillation; Gynecomastia; RBBB; High cholesterol; Hiatal hernia; AAA (abdominal aortic aneurysm); Heart murmur; Neuropathic impotence; Neuropathy; Macular degeneration, wet; Pulmonary fibrosis; Liver disease; Left displaced femoral neck fracture (05/30/2014); and Elevated troponin.  has past surgical history that includes Colostomy (1960); Shoulder surgery (Right); Nose surgery; and Hip Arthroplasty (Left, 06/01/2014). Prior to Admission medications   Medication Sig Start Date End Date Taking? Authorizing Provider  finasteride (PROSCAR) 5 MG tablet Take 5 mg by mouth daily.    Historical Provider, MD    Glucosamine-Chondroit-Vit C-Mn (GLUCOSAMINE 1500 COMPLEX PO) Take 1,500 mg by mouth 2 (two) times daily.     Historical Provider, MD  Multiple Vitamins-Minerals (EYE VITAMINS PO) Take 1 tablet by mouth daily. SMOTHERS FORMULA    Historical Provider, MD  Omega-3 Fatty Acids (FISH OIL) 1200 MG CAPS Take 1,200 mg by mouth 2 (two) times daily.     Historical Provider, MD  rosuvastatin (CRESTOR) 10 MG tablet Take 10 mg by mouth daily.    Historical Provider, MD  senna (SENOKOT) 8.6 MG TABS tablet Take 1 tablet (8.6 mg total) by mouth 2 (two) times daily. 06/06/14   Domenic Polite, MD  sotalol (BETAPACE) 80 MG tablet Take 80 mg by mouth 2 (two) times daily.    Historical Provider, MD  traMADol (ULTRAM) 50 MG tablet Take one tablet by mouth every morning; Take one tablet by mouth every night at bedtime as needed for pain 06/09/14   Lauree Chandler, NP   No Known Allergies  FAMILY HISTORY:  family history includes COPD in his brother; Rectal cancer in his father. SOCIAL HISTORY:  reports that he has been smoking Cigarettes.  He has a 31.5 pack-year smoking history. He does not have any smokeless tobacco history on file. He reports that he does not drink alcohol or use illicit drugs.  REVIEW OF SYSTEMS:   As per HPI - All other systems reviewed and were neg.   SUBJECTIVE:   VITAL SIGNS: Temp:  [98.4 F (36.9 C)] 98.4 F (36.9 C) (09/28 1345) Pulse Rate:  [60-63] 63 (09/28 1630) Resp:  [18-22] 18 (09/28 1630) BP: (120-143)/(54-71) 138/71 mmHg (09/28 1630) SpO2:  [  96 %-98 %] 97 % (09/28 1630) Weight:  [192 lb (87.091 kg)] 192 lb (87.091 kg) (09/28 1350)  PHYSICAL EXAMINATION: General:  Very pleasant, frail, chronically ill appearing male, NAD  Neuro:  Awake, alert, intermittent confusion but knows he is confused, MAE, gen weakness  HEENT:  Mm moist, no JVD  Cardiovascular:  s1s2 irreg Lungs:  resps even non labored on [place], absent breath sounds L, few scattered crackles R  Abdomen:  Soft,  round, +bs, non tender  Musculoskeletal:  Warm and dry, scant BLE edema    Recent Labs Lab 04/26/15 1421  NA 134*  K 4.9  CL 99*  CO2 25  BUN 16  CREATININE 1.09  GLUCOSE 186*    Recent Labs Lab 04/26/15 1421  HGB 11.9*  HCT 36.2*  WBC 5.8  PLT 178   Dg Chest 2 View  04/26/2015   CLINICAL DATA:  Shortness of breath, onset 3-4 days ago.  EXAM: CHEST  2 VIEW  COMPARISON:  Chest x-ray 06/03/2014  FINDINGS: There is a large left pleural effusion with near complete opacification of the left hemithorax. Probable underlying COPD. Scarring noted in the right base. There is cardiomegaly. Mitral valve annular calcifications noted. No acute bony abnormality.  IMPRESSION: Large left pleural effusion with near complete opacification of the left hemithorax.  COPD.  Right basilar scarring.  Cardiomegaly.   Electronically Signed   By: Rolm Baptise M.D.   On: 04/26/2015 15:40    ASSESSMENT / PLAN:  Pleural effusion  - very large.  Unclear etiology.  Has AFib, hx grade 1 diastolic dysfunction.  ?Hemothorax with fall hx.   Hx pulmonary fibrosis   REC -  coags pending  U/s eval chest  Needs thoracentesis - s/w pt and son including risks benefits  F/u CXR  Echo  Send fluid for cytology, cultures PRN BD    Nickolas Madrid, NP 04/26/2015  5:08 PM Pager: [phone] or [phone]

## 2015-04-27 ENCOUNTER — Inpatient Hospital Stay (HOSPITAL_COMMUNITY): Payer: Medicare Other

## 2015-04-27 DIAGNOSIS — J9601 Acute respiratory failure with hypoxia: Secondary | ICD-10-CM

## 2015-04-27 DIAGNOSIS — C3490 Malignant neoplasm of unspecified part of unspecified bronchus or lung: Secondary | ICD-10-CM | POA: Diagnosis not present

## 2015-04-27 LAB — TYPE AND SCREEN
ABO/RH(D): A POS
ANTIBODY SCREEN: NEGATIVE

## 2015-04-27 LAB — BASIC METABOLIC PANEL
ANION GAP: 6 (ref 5–15)
BUN: 17 mg/dL (ref 6–20)
CO2: 30 mmol/L (ref 22–32)
Calcium: 8.8 mg/dL — ABNORMAL LOW (ref 8.9–10.3)
Chloride: 98 mmol/L — ABNORMAL LOW (ref 101–111)
Creatinine, Ser: 1.06 mg/dL (ref 0.61–1.24)
GFR calc Af Amer: >60 mL/min (ref >60)
GLUCOSE: 168 mg/dL — AB (ref 65–99)
POTASSIUM: 4.6 mmol/L (ref 3.5–5.1)
Sodium: 134 mmol/L — ABNORMAL LOW (ref 135–145)

## 2015-04-27 LAB — FUNGAL STAIN: Fungal Smear: NONE SEEN

## 2015-04-27 LAB — CBC
HCT: 36.3 % — ABNORMAL LOW (ref 39.0–52.0)
HEMATOCRIT: 35.8 % — AB (ref 39.0–52.0)
HEMOGLOBIN: 11.5 g/dL — AB (ref 13.0–17.0)
Hemoglobin: 11.9 g/dL — ABNORMAL LOW (ref 13.0–17.0)
MCH: 28.5 pg (ref 26.0–34.0)
MCH: 29.1 pg (ref 26.0–34.0)
MCHC: 32.1 g/dL (ref 30.0–36.0)
MCHC: 32.8 g/dL (ref 30.0–36.0)
MCV: 88.8 fL (ref 78.0–100.0)
MCV: 88.8 fL (ref 78.0–100.0)
PLATELETS: 182 10*3/uL (ref 150–400)
Platelets: 192 10*3/uL (ref 150–400)
RBC: 4.03 MIL/uL — ABNORMAL LOW (ref 4.22–5.81)
RBC: 4.09 MIL/uL — AB (ref 4.22–5.81)
RDW: 14.2 % (ref 11.5–15.5)
RDW: 14.3 % (ref 11.5–15.5)
WBC: 6 10*3/uL (ref 4.0–10.5)
WBC: 7.7 10*3/uL (ref 4.0–10.5)

## 2015-04-27 LAB — GLUCOSE, CAPILLARY
GLUCOSE-CAPILLARY: 178 mg/dL — AB (ref 65–99)
GLUCOSE-CAPILLARY: 135 mg/dL — AB (ref 65–99)
Glucose-Capillary: 147 mg/dL — ABNORMAL HIGH (ref 65–99)
Glucose-Capillary: 136 mg/dL — ABNORMAL HIGH (ref 65–99)

## 2015-04-27 LAB — RHEUMATOID FACTORS, FLUID: RHEUMATOID ARTHRITIS, QN/FLUID: NEGATIVE

## 2015-04-27 MED ORDER — LORAZEPAM 0.5 MG PO TABS
0.2500 mg | ORAL_TABLET | Freq: Once | ORAL | Status: AC
  Administered 2015-04-27: 0.25 mg via ORAL
  Filled 2015-04-27: qty 1

## 2015-04-27 MED ORDER — ESCITALOPRAM OXALATE 10 MG PO TABS
10.0000 mg | ORAL_TABLET | Freq: Every day | ORAL | Status: DC
  Administered 2015-04-27 – 2015-05-04 (×8): 10 mg via ORAL
  Filled 2015-04-27 (×8): qty 1

## 2015-04-27 MED ORDER — POLYETHYLENE GLYCOL 3350 17 G PO PACK
17.0000 g | PACK | Freq: Every day | ORAL | Status: DC | PRN
  Administered 2015-04-27 – 2015-05-01 (×2): 17 g via ORAL
  Filled 2015-04-27 (×2): qty 1

## 2015-04-27 MED ORDER — FLUTICASONE PROPIONATE 50 MCG/ACT NA SUSP
1.0000 | Freq: Every day | NASAL | Status: DC
  Administered 2015-04-27 – 2015-05-04 (×8): 1 via NASAL
  Filled 2015-04-27 (×3): qty 16

## 2015-04-27 MED ORDER — MORPHINE SULFATE (PF) 2 MG/ML IV SOLN
1.0000 mg | INTRAVENOUS | Status: AC | PRN
  Administered 2015-04-27 – 2015-04-28 (×2): 1 mg via INTRAVENOUS
  Filled 2015-04-27 (×2): qty 1

## 2015-04-27 MED ORDER — LORAZEPAM 0.5 MG PO TABS
0.5000 mg | ORAL_TABLET | Freq: Two times a day (BID) | ORAL | Status: DC | PRN
  Administered 2015-04-27 – 2015-05-03 (×6): 0.5 mg via ORAL
  Filled 2015-04-27 (×8): qty 1

## 2015-04-27 MED ORDER — TRAMADOL HCL 50 MG PO TABS
50.0000 mg | ORAL_TABLET | Freq: Four times a day (QID) | ORAL | Status: AC | PRN
Start: 2015-04-27 — End: 2015-04-28
  Administered 2015-04-27 – 2015-04-28 (×2): 50 mg via ORAL
  Filled 2015-04-27: qty 1

## 2015-04-27 NOTE — Clinical Social Work Note (Signed)
Clinical Social Work Assessment  Patient Details  Name: [name] [name] MRN: [id number] Date of Birth: 1926-11-18  Date of referral:  04/27/15               Reason for consult:  Facility Placement                Permission sought to share information with:  Family Supports, Customer service manager Permission granted to share information::     Name::     Tami Lin  Agency::  [facility]  Relationship::  dtr  Sport and exercise psychologist Information:  [phone]  Housing/Transportation Living arrangements for the past 2 months:  [place] of Information:  Adult Children Patient Interpreter Needed:  None Criminal Activity/Legal Involvement Pertinent to Current Situation/Hospitalization:  No - Comment as needed Significant Relationships:  Adult Children Lives with:  Facility Resident Do you feel safe going back to the place where you live?  Yes Need for family participation in patient care:  Yes (Comment)  Care giving concerns:  None- pt is long term care resident at [facility]   Social Worker assessment / plan:  CSW spoke with pt dtr concerning plan for return to SNF at time of DC  Employment status:  Retired Nurse, adult PT Recommendations:    Information / Referral to community resources:  [place]  Patient/Family's Response to care:  Pt dtr is agreeable to pt return to SNF and feels that pt is well taken care of there  Patient/Family's Understanding of and Emotional Response to Diagnosis, Current Treatment, and Prognosis: Pt dtr has no concerns or questions about pt prognosis/treatment and is hopeful that pt will recover soon and be able to return to SNF  Emotional Assessment Appearance:  Appears stated age Attitude/Demeanor/Rapport:  Unable to Assess Affect (typically observed):  Unable to Assess Orientation:  Oriented to Self, Fluctuating Orientation (Suspected and/or reported  Sundowners) Alcohol / Substance use:    Psych involvement (Current and /or in the community):  No (Comment)  Discharge Needs  Concerns to be addressed:  Care Coordination Readmission within the last 30 days:  No Current discharge risk:  Physical Impairment Barriers to Discharge:  Continued Medical Work up   Cranford Mon, LCSW 04/27/2015, 3:46 PM

## 2015-04-27 NOTE — Progress Notes (Addendum)
Shift note: LPN, Melanie, paged this NP earlier to come and check pt secondary to swelling on the lower left back and to check chest tube. Pt admitted with large pleural effusion on the left. Since admission, pt has undergone a thoracentesis 9/28 with subsequent chest tube placement by PCCM today. Upon CT placement, pt drained 200-300cc out immediately. After that, drainage 1850cc in one hour and PCCM was contacted by day RN and CT was clamped at 1300hrs and unclamped to suction again at 1900hrs. Within 40 minutes after unclamping, the amount of drainage was 1100cc and PCCM was again called by day RN and CT was clamped a few minutes before this NP arrived at the bedside. LPN was stating pt had a collection of fluid at the left lower back which she has demarcated with a marker. Per day RN, this fluid/swelling was not present earlier.  S: pt says he is not any more SOB than he has been. No chest pain. + pain at CT insertion site. Some "congestion" in his throat. Other than that, he feels pretty good. O: Appears well, although frail. Vitals are good. Alert and oriented, smiling and talking. Skin is pale but warm and dry. CT with pink to red drainage noted in tubing. CT is clamped at this time. Respiratory effort is normal. O2 sat 99% on O2 per [place]. Lung sounds clear except for crackles at the left lower lobe. Good air exchange throughout. Left lower back with subq edema. No crepitus.  A/P: 1. Gentleman with large pleural effusion s/p CT placement today. His entire left lung was whited out upon arrival, so pleural effusion was pretty substantial. CXR shows improvement in pleural effusion. Radiology read was ? Tiny pneumothorax vs. Artifact. This NP called PCCM and spoke to Dr. Oletta Darter in [place] box. He reviewed CXR and chart. Stated pt likely had 3L of effusion. In suspicion of pneumothorax and because of effusion, will leave CT to suction tonight. Do not suspect subq emphysema in re: swelling.  Pt transferred to SDU  for closer monitoring tonight. Plan discussed with RRRN, LPN, and RN on 2H.  Pain med for chest tube site pain.  Daughter informed of TF by LPN.  Will follow. Clance Boll, NP [facility] Addendum: CBC with stable Hgb.

## 2015-04-27 NOTE — Op Note (Signed)
Chest Tube Insertion Procedure Note  Indications:  Clinically significant Effusion  Pre-operative Diagnosis: Effusion  Post-operative Diagnosis: Effusion  Procedure Details  Informed consent was obtained for the procedure, including sedation.  Risks of lung perforation, hemorrhage, arrhythmia, and adverse drug reaction were discussed.   Ultrasound was used to identify the best pocket of fluid in the left.  This revealed a large left sided effusion.  After sterile skin prep, using standard technique, a 14 French tube was placed in the left lateral 9th rib space   Findings: 200 ml of serosanguinous fluid obtained immediately, but now with slow drainage.  I suspect based on yesterday's result with the thoracentesis and today's pigtail result that the fluid is loculated.    Estimated Blood Loss:  Minimal         Specimens:  None              Complications:  None; patient tolerated the procedure well.         Disposition: Remain on floor (2 [place])         Condition: stable  Attending Attestation: The procedure was performed by Georgann Housekeeper ACNP with my supervision.  I was present and scrubbed for and assisted with the key portions of the procedure.  Roselie Awkward, MD [place] [facility] Pager: [phone] Cell: [phone] After 3pm or if no response, call [phone]

## 2015-04-27 NOTE — Progress Notes (Signed)
Report call to R.N. On 2 heart and patient was transfer at 21:10 via bed with 2 r.n.'s and monitor and o2 and all belonings.

## 2015-04-27 NOTE — Significant Event (Signed)
Rapid Response Event Note  Overview: Time Called: 1955 Arrival Time: 2000 Event Type: Other (Comment) (excessive bleeding from CT  with fluid accumulation around site)  Initial Focused Assessment:     Called by Jerene Pitch, RN [phone] with concerns about pt with possible fluid accumulation around left CT site.  Pt was admitted today with left pleural effusion and had CT place at noon with return of 200-300cc blood. At 1300 hrs pt had 1850 cc output from CT, MD notified and CT was clamped per MD order.  At 1900 hrs CT was unclamped with return of 1100 cc blood within 40 minutes.  MD notified and CT reclamped.  At change of shift  Report 1930 hrs  pt noted to have an accumulation of fluid around left CT site extending downward and over to spine.  Area marked by RN Upon my arrival pt was sitting on side of bed, alert, oriented, pale.  Denies an increase in SOB, denies CP but c/o LUQ abd pain7/10.  Bilateral BS clear in go upper fields, diminished in Bialteral bases with few crackles noted in LLL. No SQ air palpated , no crepitus auscultated.  Fluid wave noted as described above.  Pt on 4l [place] with 02 sats 98%.  Respirations 20, and non labored.   Left pigtail  CT in place and clamped per order.   Cardiac monitor SR rate 60 with occasional PVCs.  112/52.  Abd soft, nondistended, active bowel sounds. LBM 2 days ago.   Interventions:  Forrest Moron, NP notified of pt status and came to bedside.  Stat PCXR, CBC, type and screen ordered, Melanie, RN notified daughter of events and need to  transfer to SDU for observation. Pt given Ultram po for abdominal pain  2100  CT placed  back to 20 cm suction per Forrest Moron, RN due to ?small pneumo vs artifact on PCXR. Decreased Left pleural effusion noted on CXR Report given by Threasa Beards, RN2W to Ramona, RN 2H and pt was transported via bed with cardiac monitor and O2 to room 2H20.   2320:  Much less CT drainage-approx 150 cc/2 hrs.  Pt continues to c/o LUQ abd pain not relieved  by MSO4.  Abd soft, active bowel sounds.  Given miralax tonight.      129/59  70SR 16RR  98% on 4l [place]   HGB 11.9 HCT 36.3       Will continue to follow as needed.   Event Summary: Name of Physician Notified: Forrest Moron, NP at 2010    at   Outcome:  Transferred to [place] at 2120 hrs.        Trivette, Gust Brooms

## 2015-04-27 NOTE — Progress Notes (Signed)
Rapid response R.N. Nevin Bloodgood call and on her way to see patient. Call her to assess Chest tube site and  pateint due to Large amt of red fluid coming out and large soft swelling area on left lower back going to the spinal cord area. K Huey Bienenstock.P. was also page at 20:10 and made aware of today events and will come see patient . See orders. V.S.S. Skin warm and dry pale. No resp. Distress noted.

## 2015-04-27 NOTE — Progress Notes (Signed)
Clamped patients chest tube for 6 hours and unclamped around 7pm. Chest tube emptied 1100 cc of fluid in 45 minutes. CCM notified of this and also a puffy location around the chest tube site.CMM recommending to clamp chest tube again and the unclamp around 1am.  Rapid response was also called and came up to see patient at this time. Vitals were stable and patient isnt SOB. While in the room after the chest tube was unclamped the patient did start to c/o disomfort in the chest near the chest tube site. Report given to the night nurse and will continue with the patients care.   Caraher, Mervin Kung RN

## 2015-04-27 NOTE — Clinical Documentation Improvement (Signed)
Critical Care  Abnormal Lab/Test Results:  Possible Clinical Conditions associated with below indicators  Hyponatremia  Other Condition  Cannot Clinically Determine   Supporting Information: 04/27/15: abnormal lab values noted:   04/26/2015 14:21 04/27/2015 03:43  Sodium 134 (L) 134 (L)   Treatment Provided: Monitor daily labs  Please exercise your independent, professional judgment when responding. A specific answer is not anticipated or expected.  Thank You,  Ermelinda Das, RN, BSN, [place] Certified Clinical Documentation Specialist [place]: Health Information Management [phone]

## 2015-04-27 NOTE — Progress Notes (Signed)
MD paged x2 for sleep aid. Pt has prescribed sleep aid. No new orders given. Will continue to monitor.  Raliegh Ip RN

## 2015-04-27 NOTE — Progress Notes (Addendum)
Chest tube inserted around noon. Patient had 1850 cc output clamped chest tube at this time. Notified MD will continue to monitor. Patient resting comfortably. NP advised [place] to continue to clamp chest tube.   Caraher, Mervin Kung RN   Spoke with NP she would like it to be clamped until 7pm then unclamp if 1 liter comes out fast reclamp.    Caraher, Mervin Kung RN

## 2015-04-27 NOTE — Care Management Note (Signed)
Case Management Note Marvetta Gibbons RN, BSN Unit 2W-Case Manager [phone]  Patient Details  Name: [name] [name] MRN: [id number] Date of Birth: March 07, 1927  Subjective/Objective:      Pt admitted with pleural effusion- with chest tube placement              Action/Plan: PTA pt was at [facility]- CSW consulted for return to SNF when medically stable  Expected Discharge Date:                  Expected Discharge Plan:  Skilled Nursing Facility  In-House Referral:  Clinical Social Work  Discharge planning Services  CM Consult  Post Acute Care Choice:    Choice offered to:     DME Arranged:    DME Agency:     HH Arranged:    [employer] Agency:     Status of Service:  In process, will continue to follow  Medicare Important Message Given:    Date Medicare IM Given:    Medicare IM give by:    Date Additional Medicare IM Given:    Additional Medicare Important Message give by:     If discussed at [place] of Stay Meetings, dates discussed:    Additional Comments:  Dawayne Patricia, RN 04/27/2015, 3:01 PM

## 2015-04-27 NOTE — Progress Notes (Signed)
Utilization review completed.  

## 2015-04-27 NOTE — Progress Notes (Signed)
Attempt to call daughter Jana Half left message on her cell phone,.

## 2015-04-27 NOTE — Progress Notes (Signed)
[facility] PROGRESS NOTE  [name] [name] ZYS:[id number] DOB: 1926/07/30 DOA: 04/26/2015 PCP: Mathews Argyle, MD  Assessment/Plan: Principal Problem:   Pleural effusion on left -Pulmonology on board and currently managing will defer further recommendations relating to evaluating and managing to them  Active Problems:   Hyperlipidemia   History of atrial fibrillation - Currently on sotalol for rate control. Recently held secondary to bradycardia - I suspect patient is not candidate for anticoagulation given frailty, fall risk, and advanced age    Diabetes mellitus, controlled - Continue carb modified diet and sliding scale insulin    Acute respiratory failure with hypoxia - Pulmonology on board  Code Status: DNR Family Communication: none at bedside Disposition Plan: Pending improvement in condition   Consultants:  Pulmonologist  Procedures:  Thoracentesis  Antibiotics:  None  HPI/Subjective: Pt has no new complaints. Reports that he is currently feeling better after procedure  Objective: Filed Vitals:   04/27/15 1408  BP: 143/55  Pulse: 59  Temp: 97.5 F (36.4 C)  Resp: 18    Intake/Output Summary (Last 24 hours) at 04/27/15 1631 Last data filed at 04/27/15 1412  Gross per 24 hour  Intake    300 ml  Output   1900 ml  Net  -1600 ml   Filed Weights   04/26/15 1350 04/26/15 1700  Weight: 87.091 kg (192 lb) 87.09 kg (192 lb)    Exam:   General:  Pt in nad, alert and awake  Cardiovascular: rrr, no mrg  Respiratory: cta bl, no wheezes  Abdomen: soft, NT, nd  Musculoskeletal: no cyanosis or clubbing   Data Reviewed: Basic Metabolic Panel:  Recent Labs Lab 04/26/15 1421 04/27/15 0343  NA 134* 134*  K 4.9 4.6  CL 99* 98*  CO2 25 30  GLUCOSE 186* 168*  BUN 16 17  CREATININE 1.09 1.06  CALCIUM 8.6* 8.8*   Liver Function Tests:  Recent Labs Lab 04/26/15 1958  PROT 6.2*   No results for input(s): LIPASE, AMYLASE in the  last 168 hours. No results for input(s): AMMONIA in the last 168 hours. CBC:  Recent Labs Lab 04/26/15 1421 04/27/15 0343  WBC 5.8 6.0  HGB 11.9* 11.5*  HCT 36.2* 35.8*  MCV 88.7 88.8  PLT 178 192   Cardiac Enzymes: No results for input(s): CKTOTAL, CKMB, CKMBINDEX, TROPONINI in the last 168 hours. BNP (last 3 results)  Recent Labs  04/26/15 1551  BNP 150.1*    ProBNP (last 3 results)  Recent Labs  05/31/14 0315  PROBNP 7931.0*    CBG:  Recent Labs Lab 04/26/15 1725 04/26/15 2043 04/27/15 0631 04/27/15 1119 04/27/15 1602  GLUCAP 108* 195* 178* 147* 136*    Recent Results (from the past 240 hour(s))  AFB culture with smear     Status: None (Preliminary result)   Collection Time: 04/26/15  7:13 PM  Result Value Ref Range Status   Specimen Description FLUID PLEURAL  Final   Special Requests NONE  Final   Acid Fast Smear   Final    NO ACID FAST BACILLI SEEN Performed at [facility]    Culture   Final    CULTURE WILL BE EXAMINED FOR 6 WEEKS BEFORE ISSUING A FINAL REPORT Performed at [facility]    Report Status PENDING  Incomplete  Body fluid culture     Status: None (Preliminary result)   Collection Time: 04/26/15  7:13 PM  Result Value Ref Range Status   Specimen Description FLUID PLEURAL  Final  Special Requests NONE  Final   Gram Stain   Final    ABUNDANT WBC PRESENT, PREDOMINANTLY MONONUCLEAR NO ORGANISMS SEEN    Culture NO GROWTH < 24 HOURS  Final   Report Status PENDING  Incomplete  Fungal stain     Status: None   Collection Time: 04/26/15  7:13 PM  Result Value Ref Range Status   Specimen Description FLUID PLEURAL  Final   Special Requests NONE  Final   Fungal Smear   Final    NO YEAST OR FUNGAL ELEMENTS SEEN Performed at [facility]    Report Status 04/27/2015 FINAL  Final     Studies: Dg Chest 2 View  04/26/2015   CLINICAL DATA:  Shortness of breath, onset 3-4 days ago.  EXAM: CHEST  2 VIEW   COMPARISON:  Chest x-ray 06/03/2014  FINDINGS: There is a large left pleural effusion with near complete opacification of the left hemithorax. Probable underlying COPD. Scarring noted in the right base. There is cardiomegaly. Mitral valve annular calcifications noted. No acute bony abnormality.  IMPRESSION: Large left pleural effusion with near complete opacification of the left hemithorax.  COPD.  Right basilar scarring.  Cardiomegaly.   Electronically Signed   By: Rolm Baptise M.D.   On: 04/26/2015 15:40   Dg Chest Port 1 View  04/27/2015   CLINICAL DATA:  81 year old male with pleural effusion.  EXAM: PORTABLE CHEST 1 VIEW  COMPARISON:  Chest x-ray 04/27/2015.  FINDINGS: New pigtail drainage catheter in lower aspect of the left hemithorax. There continues to be a large left-sided pleural effusion. No definite pneumothorax component. Minimal aerated lung in the apex of the left upper lobe. The remainder of the left lung is either completely collapsed or consolidated. Coarse interstitial markings are again noted throughout the right lung, particularly at the right lung base, likely related to chronic interstitial lung disease. No acute consolidative disease in the right lung. No right pleural effusion. No evidence of pulmonary edema. Cardiac silhouette and mediastinal contours are largely obscured by in the opacified left hemithorax. Extensive atherosclerotic calcifications in the thoracic aorta. The  IMPRESSION: 1. Status post left-sided chest tube placement with slight decrease in large left pleural effusion. Extensive atelectasis and/or consolidation throughout the left lung. 2. Coarse interstitial markings in the right lung suggesting underlying interstitial lung disease. 3. Atherosclerosis.   Electronically Signed   By: Vinnie Langton M.D.   On: 04/27/2015 12:51   Dg Chest Portable 1 View  04/27/2015   CLINICAL DATA:  Shortness of breath, pleural effusion, left chest pain  EXAM: PORTABLE CHEST 1 VIEW   COMPARISON:  04/18/2015  FINDINGS: Near complete opacification of the left hemithorax, likely reflecting increasing large left pleural effusion.  Mild subpleural reticulation/fibrosis at the right lung base.  No pneumothorax is seen.  The heart is normal in size but partially obscured.  IMPRESSION: New complete opacification of the left hemithorax, likely reflecting increasing large left pleural effusion.   Electronically Signed   By: Julian Hy M.D.   On: 04/27/2015 10:53   Dg Chest Port 1 View  04/26/2015   CLINICAL DATA:  Left pleural effusion, post thoracentesis. Rule out pneumothorax.  EXAM: PORTABLE CHEST 1 VIEW  COMPARISON:  Earlier this day at 1530 hour  FINDINGS: Portable exam at 1946 hour: Large left pleural effusion, however slightly decreased in size from prior exam with decreased apical component. Small amount of aerated lung is seen in the left upper hemithorax. No definite pneumothorax. Mild  chronic change in the right lung without right pleural effusion. Cardiomediastinal contours are unchanged, partially obscured.  IMPRESSION: No pneumothorax. Persistent large left pleural effusion, slightly decreased in size post thoracentesis.   Electronically Signed   By: Jeb Levering M.D.   On: 04/26/2015 20:11    Scheduled Meds: . escitalopram  10 mg Oral Daily  . finasteride  5 mg Oral Daily  . fluticasone  1 spray Each Nare Daily  . insulin aspart  0-9 Units Subcutaneous TID WC  . senna  1 tablet Oral BID  . sodium chloride  3 mL Intravenous Q12H  . sotalol  80 mg Oral BID  . traMADol  50 mg Oral q morning - 10a   Continuous Infusions:   Time spent: > 35 minutes  Velvet Bathe  [facility] Pager [phone] If 7PM-7AM, please contact night-coverage at [email or web address], password [facility] 04/27/2015, 4:31 PM  LOS: 1 day

## 2015-04-27 NOTE — Progress Notes (Signed)
Daughter Jana Half return call and aware of todays events and movcing to ICU to watch pt closely

## 2015-04-27 NOTE — Progress Notes (Signed)
Name: [name] [name] MRN: [id number] DOB: June 20, 1927    ADMISSION DATE:  04/26/2015 CONSULTATION DATE:  9/28  REFERRING MD :  Sloan Leiter   CHIEF COMPLAINT:  Pleural effusion   BRIEF PATIENT DESCRIPTION: 81yo male SNF resident with hx AFib (on NO anticoagulation r/t multiple falls) pulmonary fibrosis, HTN, DM presented 9/28 with 2 day hx intermittent "episodes" of significant SOB. An outpt CXR revealed mod pleural effusion on 9/27 per son with plans for outpt thoracentesis but dyspnea worsened and he was sent to ER.  CXR now reveals complete opacification L hemithorax.  PCCM consulted for thoracentesis.   SIGNIFICANT EVENTS  9/28>> thoracentesis->681m dark turbid fluid  STUDIES:  RF= NEG  Pleural fluid cytology 9/28>>>  SUBJECTIVE:  Did not sleep well.  Still some SOB but improved.   VITAL SIGNS: Temp:  [97.8 F (36.6 C)-98.4 F (36.9 C)] 97.8 F (36.6 C) (09/29 0426) Pulse Rate:  [58-72] 58 (09/29 0426) Resp:  [18-22] 18 (09/29 0426) BP: (118-147)/(49-71) 147/59 mmHg (09/29 0426) SpO2:  [96 %-100 %] 96 % (09/29 0426) Weight:  [192 lb (87.09 kg)-192 lb (87.091 kg)] 192 lb (87.09 kg) (09/28 1700)  PHYSICAL EXAMINATION: General:  Very pleasant, frail, chronically ill appearing male, NAD  Neuro:  Awake, alert, intermittent pleasant confusion but knows he is confused, MAE, gen weakness  HEENT:  Mm moist, no JVD  Cardiovascular:  s1s2 irreg Lungs:  resps even non labored on [place], absent breath sounds L, few scattered crackles R  Abdomen:  Soft, round, +bs, non tender  Musculoskeletal:  Warm and dry, scant BLE edema    Recent Labs Lab 04/26/15 1421 04/27/15 0343  NA 134* 134*  K 4.9 4.6  CL 99* 98*  CO2 25 30  BUN 16 17  CREATININE 1.09 1.06  GLUCOSE 186* 168*    Recent Labs Lab 04/26/15 1421 04/27/15 0343  HGB 11.9* 11.5*  HCT 36.2* 35.8*  WBC 5.8 6.0  PLT 178 192   Dg Chest 2 View  04/26/2015   CLINICAL DATA:  Shortness of breath, onset 3-4 days ago.  EXAM:  CHEST  2 VIEW  COMPARISON:  Chest x-ray 06/03/2014  FINDINGS: There is a large left pleural effusion with near complete opacification of the left hemithorax. Probable underlying COPD. Scarring noted in the right base. There is cardiomegaly. Mitral valve annular calcifications noted. No acute bony abnormality.  IMPRESSION: Large left pleural effusion with near complete opacification of the left hemithorax.  COPD.  Right basilar scarring.  Cardiomegaly.   Electronically Signed   By: KRolm BaptiseM.D.   On: 04/26/2015 15:40   Dg Chest Port 1 View  04/26/2015   CLINICAL DATA:  Left pleural effusion, post thoracentesis. Rule out pneumothorax.  EXAM: PORTABLE CHEST 1 VIEW  COMPARISON:  Earlier this day at 1530 hour  FINDINGS: Portable exam at 1946 hour: Large left pleural effusion, however slightly decreased in size from prior exam with decreased apical component. Small amount of aerated lung is seen in the left upper hemithorax. No definite pneumothorax. Mild chronic change in the right lung without right pleural effusion. Cardiomediastinal contours are unchanged, partially obscured.  IMPRESSION: No pneumothorax. Persistent large left pleural effusion, slightly decreased in size post thoracentesis.   Electronically Signed   By: MJeb LeveringM.D.   On: 04/26/2015 20:11    ASSESSMENT / PLAN:  Pleural effusion  - very large. Exudative. Has AFib, hx grade 1 diastolic dysfunction.  BNP elevated but not significantly.  NOT hemothorax (hct 1.5%).  ??  infectious r/t recent dental work v malignancy v parapneumonic although less likely.  Hx pulmonary fibrosis   REC -  F/u cultures, cytology F/u CXR  Echo  Consider repeat thoracentesis and subsequent CT chest if able to tap dry  Would hold off on any CVTS involvement - doubt pt would tolerate highly invasive interventions  PRN BD  Resume home lexapro per pt request    Nickolas Madrid, NP 04/27/2015  10:10 AM Pager: [phone] or [phone]

## 2015-04-28 ENCOUNTER — Inpatient Hospital Stay (HOSPITAL_COMMUNITY): Payer: Medicare Other

## 2015-04-28 LAB — GLUCOSE, CAPILLARY
GLUCOSE-CAPILLARY: 154 mg/dL — AB (ref 65–99)
Glucose-Capillary: 167 mg/dL — ABNORMAL HIGH (ref 65–99)
Glucose-Capillary: 143 mg/dL — ABNORMAL HIGH (ref 65–99)

## 2015-04-28 LAB — PROCALCITONIN: Procalcitonin: <0.1 ng/mL

## 2015-04-28 LAB — MRSA PCR SCREENING: MRSA by PCR: NEGATIVE

## 2015-04-28 MED ORDER — INSULIN ASPART 100 UNIT/ML ~~LOC~~ SOLN
0.0000 [IU] | Freq: Three times a day (TID) | SUBCUTANEOUS | Status: DC
  Administered 2015-04-28 (×2): 2 [IU] via SUBCUTANEOUS
  Administered 2015-04-29 – 2015-04-30 (×4): 1 [IU] via SUBCUTANEOUS
  Administered 2015-04-30: 2 [IU] via SUBCUTANEOUS
  Administered 2015-04-30: 3 [IU] via SUBCUTANEOUS
  Administered 2015-05-01: 2 [IU] via SUBCUTANEOUS
  Administered 2015-05-01: 3 [IU] via SUBCUTANEOUS
  Administered 2015-05-01: 2 [IU] via SUBCUTANEOUS
  Administered 2015-05-02 (×2): 3 [IU] via SUBCUTANEOUS
  Administered 2015-05-02: 2 [IU] via SUBCUTANEOUS
  Administered 2015-05-03: 5 [IU] via SUBCUTANEOUS
  Administered 2015-05-03: 7 [IU] via SUBCUTANEOUS
  Administered 2015-05-04 (×2): 3 [IU] via SUBCUTANEOUS

## 2015-04-28 NOTE — Progress Notes (Signed)
PCCM Interval Note  Pt moved to SDU for monitoring after significant L chest tube drainage. He was clamped temporarily, appeared to be stable and was then placed back to suction. He drained almost 3L 9/29 0700 - 9/30 0700. He is currently gone down for CT chest to allow a better look at the L lung after decompression. We will review scan and assist with next steps. Await cytology on pleural fluid before planning any kind of procedure. I would leave him to suction for now, follow CT output and cytology results.   Baltazar Apo, MD, PhD 04/28/2015, 10:58 AM [facility] [phone] or if no answer [phone]

## 2015-04-28 NOTE — Progress Notes (Signed)
[facility] PROGRESS NOTE  [name] [name] CWC:[id number] DOB: Dec 08, 1926 DOA: 04/26/2015 PCP: Mathews Argyle, MD  Assessment/Plan: Principal Problem:   Pleural effusion on left -Pulmonology on board and currently managing will defer further recommendations relating to evaluating and managing to them - Patient with chest tube in place  Active Problems:   Hyperlipidemia   History of atrial fibrillation - Currently on sotalol for rate control. Holding parameters placed - I suspect patient is not candidate for anticoagulation given frailty, fall risk, and advanced age    Diabetes mellitus, controlled - Continue carb modified diet and sliding scale insulin    Acute respiratory failure with hypoxia - Pulmonology on board - continue supplemental oxygen as needed.  Code Status: DNR Family Communication: none at bedside Disposition Plan: Pending improvement in condition   Consultants:  Pulmonologist  Procedures:  Thoracentesis  Antibiotics:  None  HPI/Subjective: Pt has no new complaints. He reports no acute issues overnight. Says he has had a lot of output from the left lung  Objective: Filed Vitals:   04/28/15 1200  BP: 120/40  Pulse:   Temp:   Resp: 20    Intake/Output Summary (Last 24 hours) at 04/28/15 1326 Last data filed at 04/28/15 1200  Gross per 24 hour  Intake    340 ml  Output   1910 ml  Net  -1570 ml   Filed Weights   04/26/15 1350 04/26/15 1700  Weight: 87.091 kg (192 lb) 87.09 kg (192 lb)    Exam:   General:  Pt in nad, alert and awake  Cardiovascular: rrr, no mrg  Respiratory: decreased breath sounds over left lung base, no wheezes  Abdomen: soft, NT, nd  Musculoskeletal: no cyanosis or clubbing   Data Reviewed: Basic Metabolic Panel:  Recent Labs Lab 04/26/15 1421 04/27/15 0343  NA 134* 134*  K 4.9 4.6  CL 99* 98*  CO2 25 30  GLUCOSE 186* 168*  BUN 16 17  CREATININE 1.09 1.06  CALCIUM 8.6* 8.8*   Liver  Function Tests:  Recent Labs Lab 04/26/15 1958  PROT 6.2*   No results for input(s): LIPASE, AMYLASE in the last 168 hours. No results for input(s): AMMONIA in the last 168 hours. CBC:  Recent Labs Lab 04/26/15 1421 04/27/15 0343 04/27/15 2120  WBC 5.8 6.0 7.7  HGB 11.9* 11.5* 11.9*  HCT 36.2* 35.8* 36.3*  MCV 88.7 88.8 88.8  PLT 178 192 182   Cardiac Enzymes: No results for input(s): CKTOTAL, CKMB, CKMBINDEX, TROPONINI in the last 168 hours. BNP (last 3 results)  Recent Labs  04/26/15 1551  BNP 150.1*    ProBNP (last 3 results)  Recent Labs  05/31/14 0315  PROBNP 7931.0*    CBG:  Recent Labs Lab 04/26/15 2043 04/27/15 0631 04/27/15 1119 04/27/15 1602 04/27/15 2143  GLUCAP 195* 178* 147* 136* 135*    Recent Results (from the past 240 hour(s))  AFB culture with smear     Status: None (Preliminary result)   Collection Time: 04/26/15  7:13 PM  Result Value Ref Range Status   Specimen Description FLUID PLEURAL  Final   Special Requests NONE  Final   Acid Fast Smear   Final    NO ACID FAST BACILLI SEEN Performed at [facility]    Culture   Final    CULTURE WILL BE EXAMINED FOR 6 WEEKS BEFORE ISSUING A FINAL REPORT Performed at [facility]    Report Status PENDING  Incomplete  Body fluid culture  Status: None (Preliminary result)   Collection Time: 04/26/15  7:13 PM  Result Value Ref Range Status   Specimen Description FLUID PLEURAL  Final   Special Requests NONE  Final   Gram Stain   Final    ABUNDANT WBC PRESENT, PREDOMINANTLY MONONUCLEAR NO ORGANISMS SEEN    Culture NO GROWTH 2 DAYS  Final   Report Status PENDING  Incomplete  Fungal stain     Status: None   Collection Time: 04/26/15  7:13 PM  Result Value Ref Range Status   Specimen Description FLUID PLEURAL  Final   Special Requests NONE  Final   Fungal Smear   Final    NO YEAST OR FUNGAL ELEMENTS SEEN Performed at [facility]    Report Status  04/27/2015 FINAL  Final  MRSA PCR Screening     Status: None   Collection Time: 04/27/15  9:26 PM  Result Value Ref Range Status   MRSA by PCR NEGATIVE NEGATIVE Final    Comment:        The GeneXpert MRSA Assay (FDA approved for NASAL specimens only), is one component of a comprehensive MRSA colonization surveillance program. It is not intended to diagnose MRSA infection nor to guide or monitor treatment for MRSA infections.      Studies: Dg Chest 2 View  04/26/2015   CLINICAL DATA:  Shortness of breath, onset 3-4 days ago.  EXAM: CHEST  2 VIEW  COMPARISON:  Chest x-ray 06/03/2014  FINDINGS: There is a large left pleural effusion with near complete opacification of the left hemithorax. Probable underlying COPD. Scarring noted in the right base. There is cardiomegaly. Mitral valve annular calcifications noted. No acute bony abnormality.  IMPRESSION: Large left pleural effusion with near complete opacification of the left hemithorax.  COPD.  Right basilar scarring.  Cardiomegaly.   Electronically Signed   By: Rolm Baptise M.D.   On: 04/26/2015 15:40   Ct Chest Wo Contrast  04/28/2015   CLINICAL DATA:  81 year old male with pleural effusion status post thoracentesis. LEFT chest tube placed.  EXAM: CT CHEST WITHOUT CONTRAST  TECHNIQUE: Multidetector CT imaging of the chest was performed following the standard protocol without IV contrast.  COMPARISON:  Radiograph 04/27/2015  FINDINGS: Mediastinum/Nodes: No axillary or supraclavicular lymphadenopathy. No mediastinal hilar adenopathy. No pericardial fluid. Mild thickening of the esophagus. Lobular calcification coronary artery calcification.  Lungs/Pleura: LEFT chest tube in place with tiny anterior pneumothorax. Minimal pleural fluid within the LEFT pleural space. There is consolidation within the RIGHT lower lobe and lingula consists with pneumonia. There is a rounded lesion in the LEFT lower lobe anteriorly measuring 34 by 27 mm (image 45, series  2 there is mild reticulation peripherally in the RIGHT lung.  Upper abdomen: Limited view of the liver, kidneys, pancreas are unremarkable. Normal adrenal glands. No aggressive osseous lesion.  Musculoskeletal: No aggressive osseous lesion.  IMPRESSION: 1. Very small anterior LEFT pneumothorax with chest tube at LEFT lung base. 2. Nodular consolidation within the lingula and the LEFT lower lobe consistent with pneumonia versus atelectasis. 3. Rounded mass lesion at the LEFT lung base with differential including rounded pneumonia/atelectasis versus neoplasm. Recommend correlation with pleural fluid cytology.   Electronically Signed   By: Suzy Bouchard M.D.   On: 04/28/2015 11:05   Dg Chest Port 1 View  04/27/2015   CLINICAL DATA:  Pleural effusion, chest tube in place.  EXAM: PORTABLE CHEST 1 VIEW  COMPARISON:  Earlier this day at 31 hour  FINDINGS:  Pigtail catheter in the lateral left hemithorax with continued decrease in left pleural effusion, with small to moderate volume of pleural fluid remaining. There is a questionable tiny left pneumothorax versus artifact in the upper lateral hemithorax. Adjacent atelectasis or consolidation in the left mid and lower lung zone. Probable peripheral fibrosis in the right lung, unchanged from prior exam. There is atherosclerosis of the thoracic aorta.  IMPRESSION: Decreased size of left pleural effusion with small to moderate volume of persistent pleural fluid. Question of tiny left pneumothorax versus artifact, left pigtail catheter in place. Patchy opacity in the left mid and lower lung zone, atelectasis versus pneumonia.   Electronically Signed   By: Jeb Levering M.D.   On: 04/27/2015 20:45   Dg Chest Port 1 View  04/27/2015   CLINICAL DATA:  81 year old male with pleural effusion.  EXAM: PORTABLE CHEST 1 VIEW  COMPARISON:  Chest x-ray 04/27/2015.  FINDINGS: New pigtail drainage catheter in lower aspect of the left hemithorax. There continues to be a large  left-sided pleural effusion. No definite pneumothorax component. Minimal aerated lung in the apex of the left upper lobe. The remainder of the left lung is either completely collapsed or consolidated. Coarse interstitial markings are again noted throughout the right lung, particularly at the right lung base, likely related to chronic interstitial lung disease. No acute consolidative disease in the right lung. No right pleural effusion. No evidence of pulmonary edema. Cardiac silhouette and mediastinal contours are largely obscured by in the opacified left hemithorax. Extensive atherosclerotic calcifications in the thoracic aorta. The  IMPRESSION: 1. Status post left-sided chest tube placement with slight decrease in large left pleural effusion. Extensive atelectasis and/or consolidation throughout the left lung. 2. Coarse interstitial markings in the right lung suggesting underlying interstitial lung disease. 3. Atherosclerosis.   Electronically Signed   By: Vinnie Langton M.D.   On: 04/27/2015 12:51   Dg Chest Portable 1 View  04/27/2015   CLINICAL DATA:  Shortness of breath, pleural effusion, left chest pain  EXAM: PORTABLE CHEST 1 VIEW  COMPARISON:  04/18/2015  FINDINGS: Near complete opacification of the left hemithorax, likely reflecting increasing large left pleural effusion.  Mild subpleural reticulation/fibrosis at the right lung base.  No pneumothorax is seen.  The heart is normal in size but partially obscured.  IMPRESSION: New complete opacification of the left hemithorax, likely reflecting increasing large left pleural effusion.   Electronically Signed   By: Julian Hy M.D.   On: 04/27/2015 10:53   Dg Chest Port 1 View  04/26/2015   CLINICAL DATA:  Left pleural effusion, post thoracentesis. Rule out pneumothorax.  EXAM: PORTABLE CHEST 1 VIEW  COMPARISON:  Earlier this day at 1530 hour  FINDINGS: Portable exam at 1946 hour: Large left pleural effusion, however slightly decreased in size  from prior exam with decreased apical component. Small amount of aerated lung is seen in the left upper hemithorax. No definite pneumothorax. Mild chronic change in the right lung without right pleural effusion. Cardiomediastinal contours are unchanged, partially obscured.  IMPRESSION: No pneumothorax. Persistent large left pleural effusion, slightly decreased in size post thoracentesis.   Electronically Signed   By: Jeb Levering M.D.   On: 04/26/2015 20:11    Scheduled Meds: . escitalopram  10 mg Oral Daily  . finasteride  5 mg Oral Daily  . fluticasone  1 spray Each Nare Daily  . insulin aspart  0-9 Units Subcutaneous TID WC  . senna  1 tablet Oral BID  .  sodium chloride  3 mL Intravenous Q12H  . sotalol  80 mg Oral BID  . traMADol  50 mg Oral q morning - 10a   Continuous Infusions:   Time spent: > 35 minutes  Velvet Bathe  [facility] Pager [phone] If 7PM-7AM, please contact night-coverage at [email or web address], password [facility] 04/28/2015, 1:26 PM  LOS: 2 days

## 2015-04-28 NOTE — Progress Notes (Signed)
Post CT of chest, pt's CT drainage observed has shifted in all three compartment in the sahara canister. Most recent output obtained. [place] canister changed. Remains at 20 cm suction. Will monitor closely.

## 2015-04-29 LAB — GLUCOSE, CAPILLARY
GLUCOSE-CAPILLARY: 153 mg/dL — AB (ref 65–99)
Glucose-Capillary: 117 mg/dL — ABNORMAL HIGH (ref 65–99)
Glucose-Capillary: 148 mg/dL — ABNORMAL HIGH (ref 65–99)
Glucose-Capillary: 200 mg/dL — ABNORMAL HIGH (ref 65–99)

## 2015-04-29 LAB — ADENOSIDE DEAMINASE, PLEURAL FL: ADENOSIDE DEAMINASE, PLEURAL FL: 6.4 U/L (ref 0.0–9.4)

## 2015-04-29 MED ORDER — TRAMADOL HCL 50 MG PO TABS
50.0000 mg | ORAL_TABLET | Freq: Once | ORAL | Status: AC
  Administered 2015-04-29: 50 mg via ORAL
  Filled 2015-04-29: qty 1

## 2015-04-29 NOTE — Progress Notes (Signed)
[facility] PROGRESS NOTE  [name] [name] VQQ:[id number] DOB: [birth year] DOA: 04/26/2015 PCP: Mathews Argyle, MD  Assessment/Plan: Principal Problem:   Pleural effusion on left -Pulmonology on board and currently managing - Patient with chest tube in place - Awaiting cytology reports. This has been discussed with patient and family and questions answered to their satisfaction.  Active Problems:   Hyperlipidemia   History of atrial fibrillation - Currently on sotalol for rate control. Holding parameters placed - I suspect patient is not candidate for anticoagulation given frailty, fall risk, and advanced age  Constipation - tap water enema ordered today, Pt has miralax     Diabetes mellitus, controlled - Continue carb modified diet and sliding scale insulin    Acute respiratory failure with hypoxia - Pulmonology on board - continue supplemental oxygen as needed.  Code Status: DNR Family Communication: none at bedside Disposition Plan: Pending improvement in condition   Consultants:  Pulmonologist  Procedures:  Thoracentesis  Antibiotics:  None  HPI/Subjective: Pt complaining of constipation stating that he has not had a BM in 4 days is requesting enema. Question answered to family's satisfaction.   Objective: Filed Vitals:   04/29/15 0836  BP: 122/40  Pulse: 59  Temp: 98.3 F (36.8 C)  Resp: 15    Intake/Output Summary (Last 24 hours) at 04/29/15 1114 Last data filed at 04/29/15 0600  Gross per 24 hour  Intake    710 ml  Output    450 ml  Net    260 ml   Filed Weights   04/26/15 1350 04/26/15 1700  Weight: 87.091 kg (192 lb) 87.09 kg (192 lb)    Exam:   General:  Pt in nad, alert and awake  Cardiovascular: rrr, no mrg  Respiratory: decreased breath sounds over left lung base, no wheezes  Abdomen: soft, NT, nd  Musculoskeletal: no cyanosis or clubbing   Data Reviewed: Basic Metabolic Panel:  Recent Labs Lab 04/26/15 1421  04/27/15 0343  NA 134* 134*  K 4.9 4.6  CL 99* 98*  CO2 25 30  GLUCOSE 186* 168*  BUN 16 17  CREATININE 1.09 1.06  CALCIUM 8.6* 8.8*   Liver Function Tests:  Recent Labs Lab 04/26/15 1958  PROT 6.2*   No results for input(s): LIPASE, AMYLASE in the last 168 hours. No results for input(s): AMMONIA in the last 168 hours. CBC:  Recent Labs Lab 04/26/15 1421 04/27/15 0343 04/27/15 2120  WBC 5.8 6.0 7.7  HGB 11.9* 11.5* 11.9*  HCT 36.2* 35.8* 36.3*  MCV 88.7 88.8 88.8  PLT 178 192 182   Cardiac Enzymes: No results for input(s): CKTOTAL, CKMB, CKMBINDEX, TROPONINI in the last 168 hours. BNP (last 3 results)  Recent Labs  04/26/15 1551  BNP 150.1*    ProBNP (last 3 results)  Recent Labs  05/31/14 0315  PROBNP 7931.0*    CBG:  Recent Labs Lab 04/27/15 2143 04/28/15 0801 04/28/15 1108 04/28/15 1640 04/28/15 2230  GLUCAP 135* 167* 143* 154* 117*    Recent Results (from the past 240 hour(s))  AFB culture with smear     Status: None (Preliminary result)   Collection Time: 04/26/15  7:13 PM  Result Value Ref Range Status   Specimen Description FLUID PLEURAL  Final   Special Requests NONE  Final   Acid Fast Smear   Final    NO ACID FAST BACILLI SEEN Performed at [facility]    Culture   Final    CULTURE WILL BE EXAMINED  FOR 6 WEEKS BEFORE ISSUING A FINAL REPORT Performed at [facility]    Report Status PENDING  Incomplete  Body fluid culture     Status: None (Preliminary result)   Collection Time: 04/26/15  7:13 PM  Result Value Ref Range Status   Specimen Description FLUID PLEURAL  Final   Special Requests NONE  Final   Gram Stain   Final    ABUNDANT WBC PRESENT, PREDOMINANTLY MONONUCLEAR NO ORGANISMS SEEN    Culture NO GROWTH 3 DAYS  Final   Report Status PENDING  Incomplete  Fungal stain     Status: None   Collection Time: 04/26/15  7:13 PM  Result Value Ref Range Status   Specimen Description FLUID PLEURAL  Final    Special Requests NONE  Final   Fungal Smear   Final    NO YEAST OR FUNGAL ELEMENTS SEEN Performed at [facility]    Report Status 04/27/2015 FINAL  Final  MRSA PCR Screening     Status: None   Collection Time: 04/27/15  9:26 PM  Result Value Ref Range Status   MRSA by PCR NEGATIVE NEGATIVE Final    Comment:        The GeneXpert MRSA Assay (FDA approved for NASAL specimens only), is one component of a comprehensive MRSA colonization surveillance program. It is not intended to diagnose MRSA infection nor to guide or monitor treatment for MRSA infections.      Studies: Ct Chest Wo Contrast  04/28/2015   CLINICAL DATA:  81 year old male with pleural effusion status post thoracentesis. LEFT chest tube placed.  EXAM: CT CHEST WITHOUT CONTRAST  TECHNIQUE: Multidetector CT imaging of the chest was performed following the standard protocol without IV contrast.  COMPARISON:  Radiograph 04/27/2015  FINDINGS: Mediastinum/Nodes: No axillary or supraclavicular lymphadenopathy. No mediastinal hilar adenopathy. No pericardial fluid. Mild thickening of the esophagus. Lobular calcification coronary artery calcification.  Lungs/Pleura: LEFT chest tube in place with tiny anterior pneumothorax. Minimal pleural fluid within the LEFT pleural space. There is consolidation within the RIGHT lower lobe and lingula consists with pneumonia. There is a rounded lesion in the LEFT lower lobe anteriorly measuring 34 by 27 mm (image 45, series 2 there is mild reticulation peripherally in the RIGHT lung.  Upper abdomen: Limited view of the liver, kidneys, pancreas are unremarkable. Normal adrenal glands. No aggressive osseous lesion.  Musculoskeletal: No aggressive osseous lesion.  IMPRESSION: 1. Very small anterior LEFT pneumothorax with chest tube at LEFT lung base. 2. Nodular consolidation within the lingula and the LEFT lower lobe consistent with pneumonia versus atelectasis. 3. Rounded mass lesion at the LEFT  lung base with differential including rounded pneumonia/atelectasis versus neoplasm. Recommend correlation with pleural fluid cytology.   Electronically Signed   By: Suzy Bouchard M.D.   On: 04/28/2015 11:05   Dg Chest Port 1 View  04/27/2015   CLINICAL DATA:  Pleural effusion, chest tube in place.  EXAM: PORTABLE CHEST 1 VIEW  COMPARISON:  Earlier this day at 1230 hour  FINDINGS: Pigtail catheter in the lateral left hemithorax with continued decrease in left pleural effusion, with small to moderate volume of pleural fluid remaining. There is a questionable tiny left pneumothorax versus artifact in the upper lateral hemithorax. Adjacent atelectasis or consolidation in the left mid and lower lung zone. Probable peripheral fibrosis in the right lung, unchanged from prior exam. There is atherosclerosis of the thoracic aorta.  IMPRESSION: Decreased size of left pleural effusion with small to moderate  volume of persistent pleural fluid. Question of tiny left pneumothorax versus artifact, left pigtail catheter in place. Patchy opacity in the left mid and lower lung zone, atelectasis versus pneumonia.   Electronically Signed   By: Jeb Levering M.D.   On: 04/27/2015 20:45   Dg Chest Port 1 View  04/27/2015   CLINICAL DATA:  81 year old male with pleural effusion.  EXAM: PORTABLE CHEST 1 VIEW  COMPARISON:  Chest x-ray 04/27/2015.  FINDINGS: New pigtail drainage catheter in lower aspect of the left hemithorax. There continues to be a large left-sided pleural effusion. No definite pneumothorax component. Minimal aerated lung in the apex of the left upper lobe. The remainder of the left lung is either completely collapsed or consolidated. Coarse interstitial markings are again noted throughout the right lung, particularly at the right lung base, likely related to chronic interstitial lung disease. No acute consolidative disease in the right lung. No right pleural effusion. No evidence of pulmonary edema. Cardiac  silhouette and mediastinal contours are largely obscured by in the opacified left hemithorax. Extensive atherosclerotic calcifications in the thoracic aorta. The  IMPRESSION: 1. Status post left-sided chest tube placement with slight decrease in large left pleural effusion. Extensive atelectasis and/or consolidation throughout the left lung. 2. Coarse interstitial markings in the right lung suggesting underlying interstitial lung disease. 3. Atherosclerosis.   Electronically Signed   By: Vinnie Langton M.D.   On: 04/27/2015 12:51    Scheduled Meds: . escitalopram  10 mg Oral Daily  . finasteride  5 mg Oral Daily  . fluticasone  1 spray Each Nare Daily  . insulin aspart  0-9 Units Subcutaneous TID WC  . senna  1 tablet Oral BID  . sodium chloride  3 mL Intravenous Q12H  . sotalol  80 mg Oral BID  . traMADol  50 mg Oral q morning - 10a   Continuous Infusions:   Time spent: > 35 minutes  Velvet Bathe  [facility] Pager [phone] If 7PM-7AM, please contact night-coverage at [email or web address], password [facility] 04/29/2015, 11:14 AM  LOS: 3 days

## 2015-04-29 NOTE — Progress Notes (Signed)
Patient requested to sit on side of bed.  When patient sat up on side of bed, patient began to complain of chest pain.  Patient stated chest pain was across upper chest, both right and left sides, described pain as aching and rated pain a 6 out of 10.  Asked patient if pain was from chest tube, patient stated it was not. EKG performed and NP Lynch paged.  0100 - patient repositioned back in bed,  Patient stated pain was resolving and down to a 4 out of 10.  Patient now stating pain feels to be related to chest tube.  Spoke with NP Donnal Debar, new orders received.  Vicie Mutters, RN

## 2015-04-29 NOTE — Progress Notes (Signed)
Flushed chest tube with 30 cc per order.  Chest tube to be flushed with 30 cc every 8 hours. Vicie Mutters, RN

## 2015-04-29 NOTE — Progress Notes (Signed)
PCCM PROGRESS NOTE  ADMISSION DATE: 04/26/2015 CONSULT DATE: 04/26/2015 REFERRING PROVIDER: [facility]  CC: Short of breath  SUBJECTIVE: Mild pain at chest tube site.  C/o constipation.  OBJECTIVE: Temp:  [97.6 F (36.4 C)-98.2 F (36.8 C)] 97.9 F (36.6 C) (10/01 0354) Pulse Rate:  [59-71] 59 (10/01 0354) Resp:  [13-27] 17 (10/01 0700) BP: (82-149)/(31-64) 116/53 mmHg (10/01 0700) SpO2:  [86 %-100 %] 86 % (10/01 0700)  290 ml from chest tube 9/30  General: pleasant HEENT: no sinus tenderness Cardiac: regular Chest: decreased BS at bases, Lt chest tube >> no air leak Abd: soft, non tender Ext: no edema Neuro: normal strength Skin: no rashes   CMP Latest Ref Rng 04/27/2015 04/26/2015 06/06/2014  Glucose 65 - 99 mg/dL 168(H) 186(H) 221(H)  BUN 6 - 20 mg/dL '17 16 22  '$ Creatinine 0.61 - 1.24 mg/dL 1.06 1.09 0.81  Sodium 135 - 145 mmol/L 134(L) 134(L) 140  Potassium 3.5 - 5.1 mmol/L 4.6 4.9 4.2  Chloride 101 - 111 mmol/L 98(L) 99(L) 102  CO2 22 - 32 mmol/L '30 25 27  '$ Calcium 8.9 - 10.3 mg/dL 8.8(L) 8.6(L) 8.6  Total Protein 6.5 - 8.1 g/dL - 6.2(L) -  Total Bilirubin 0.3 - 1.2 mg/dL - - -  Alkaline Phos 39 - 117 U/L - - -  AST 0 - 37 U/L - - -  ALT 0 - 53 U/L - - -     CBC Latest Ref Rng 04/27/2015 04/27/2015 04/26/2015  WBC 4.0 - 10.5 K/uL 7.7 6.0 5.8  Hemoglobin 13.0 - 17.0 g/dL 11.9(L) 11.5(L) 11.9(L)  Hematocrit 39.0 - 52.0 % 36.3(L) 35.8(L) 36.2(L)  Platelets 150 - 400 K/uL 182 192 178     Ct Chest Wo Contrast  04/28/2015   CLINICAL DATA:  81 year old male with pleural effusion status post thoracentesis. LEFT chest tube placed.  EXAM: CT CHEST WITHOUT CONTRAST  TECHNIQUE: Multidetector CT imaging of the chest was performed following the standard protocol without IV contrast.  COMPARISON:  Radiograph 04/27/2015  FINDINGS: Mediastinum/Nodes: No axillary or supraclavicular lymphadenopathy. No mediastinal hilar adenopathy. No pericardial fluid. Mild thickening of the esophagus.  Lobular calcification coronary artery calcification.  Lungs/Pleura: LEFT chest tube in place with tiny anterior pneumothorax. Minimal pleural fluid within the LEFT pleural space. There is consolidation within the RIGHT lower lobe and lingula consists with pneumonia. There is a rounded lesion in the LEFT lower lobe anteriorly measuring 34 by 27 mm (image 45, series 2 there is mild reticulation peripherally in the RIGHT lung.  Upper abdomen: Limited view of the liver, kidneys, pancreas are unremarkable. Normal adrenal glands. No aggressive osseous lesion.  Musculoskeletal: No aggressive osseous lesion.  IMPRESSION: 1. Very small anterior LEFT pneumothorax with chest tube at LEFT lung base. 2. Nodular consolidation within the lingula and the LEFT lower lobe consistent with pneumonia versus atelectasis. 3. Rounded mass lesion at the LEFT lung base with differential including rounded pneumonia/atelectasis versus neoplasm. Recommend correlation with pleural fluid cytology.   Electronically Signed   By: Suzy Bouchard M.D.   On: 04/28/2015 11:05   Dg Chest Port 1 View  04/27/2015   CLINICAL DATA:  Pleural effusion, chest tube in place.  EXAM: PORTABLE CHEST 1 VIEW  COMPARISON:  Earlier this day at 1230 hour  FINDINGS: Pigtail catheter in the lateral left hemithorax with continued decrease in left pleural effusion, with small to moderate volume of pleural fluid remaining. There is a questionable tiny left pneumothorax versus artifact in the upper lateral  hemithorax. Adjacent atelectasis or consolidation in the left mid and lower lung zone. Probable peripheral fibrosis in the right lung, unchanged from prior exam. There is atherosclerosis of the thoracic aorta.  IMPRESSION: Decreased size of left pleural effusion with small to moderate volume of persistent pleural fluid. Question of tiny left pneumothorax versus artifact, left pigtail catheter in place. Patchy opacity in the left mid and lower lung zone, atelectasis  versus pneumonia.   Electronically Signed   By: Jeb Levering M.D.   On: 04/27/2015 20:45   Dg Chest Port 1 View  04/27/2015   CLINICAL DATA:  81 year old male with pleural effusion.  EXAM: PORTABLE CHEST 1 VIEW  COMPARISON:  Chest x-ray 04/27/2015.  FINDINGS: New pigtail drainage catheter in lower aspect of the left hemithorax. There continues to be a large left-sided pleural effusion. No definite pneumothorax component. Minimal aerated lung in the apex of the left upper lobe. The remainder of the left lung is either completely collapsed or consolidated. Coarse interstitial markings are again noted throughout the right lung, particularly at the right lung base, likely related to chronic interstitial lung disease. No acute consolidative disease in the right lung. No right pleural effusion. No evidence of pulmonary edema. Cardiac silhouette and mediastinal contours are largely obscured by in the opacified left hemithorax. Extensive atherosclerotic calcifications in the thoracic aorta. The  IMPRESSION: 1. Status post left-sided chest tube placement with slight decrease in large left pleural effusion. Extensive atelectasis and/or consolidation throughout the left lung. 2. Coarse interstitial markings in the right lung suggesting underlying interstitial lung disease. 3. Atherosclerosis.   Electronically Signed   By: Vinnie Langton M.D.   On: 04/27/2015 12:51   Dg Chest Portable 1 View  04/27/2015   CLINICAL DATA:  Shortness of breath, pleural effusion, left chest pain  EXAM: PORTABLE CHEST 1 VIEW  COMPARISON:  04/18/2015  FINDINGS: Near complete opacification of the left hemithorax, likely reflecting increasing large left pleural effusion.  Mild subpleural reticulation/fibrosis at the right lung base.  No pneumothorax is seen.  The heart is normal in size but partially obscured.  IMPRESSION: New complete opacification of the left hemithorax, likely reflecting increasing large left pleural effusion.    Electronically Signed   By: Julian Hy M.D.   On: 04/27/2015 10:53     CULTURES: Lt pleural fluid 9/28 >> Lt pleural fluid AFB 9/28 >> Lt pleural fluid fungal 9/28 >>  STUDIES: 9/28 Lt pleural fluid >> protein 4, LDH 218, WBC 2700 (90%L) 9/30 CT chest >> consolidation RLL and lingula, 3.4 cm rounded lesion LLL  EVENTS: 9/28 Admit 9/29 Lt chest tube placed  DISCUSSION: 81 yo male presented with dyspnea and found to have Lt pleural effusion.  ASSESSMENT/PLAN:  Exudate Lt pleural effusion with lymphocyte predominance. LLL 3.4 cm rounded lesion ?malignancy. Plan: - f/u pleural fluid cytology 9/28 - f/u CXR 9/30  - continue chest tube to -20 cm suction  Acute hypoxic respiratory failure. Plan: - oxygen to keep SpO2 90 to 95%  Goals of Care >> DNR  Chesley Mires, MD [place] 04/29/2015, 8:04 AM Pager:  [phone] After 3pm call: [phone]

## 2015-04-30 ENCOUNTER — Inpatient Hospital Stay (HOSPITAL_COMMUNITY): Payer: Medicare Other

## 2015-04-30 DIAGNOSIS — L8990 Pressure ulcer of unspecified site, unspecified stage: Secondary | ICD-10-CM | POA: Insufficient documentation

## 2015-04-30 LAB — BODY FLUID CULTURE: CULTURE: NO GROWTH

## 2015-04-30 LAB — GLUCOSE, CAPILLARY
GLUCOSE-CAPILLARY: 165 mg/dL — AB (ref 65–99)
GLUCOSE-CAPILLARY: 237 mg/dL — AB (ref 65–99)
GLUCOSE-CAPILLARY: 148 mg/dL — AB (ref 65–99)
GLUCOSE-CAPILLARY: 172 mg/dL — AB (ref 65–99)
Glucose-Capillary: 151 mg/dL — ABNORMAL HIGH (ref 65–99)

## 2015-04-30 LAB — PROCALCITONIN

## 2015-04-30 MED ORDER — FLEET ENEMA 7-19 GM/118ML RE ENEM
1.0000 | ENEMA | Freq: Once | RECTAL | Status: AC
Start: 2015-04-30 — End: 2015-04-30
  Administered 2015-04-30: 1 via RECTAL
  Filled 2015-04-30: qty 1

## 2015-04-30 NOTE — Progress Notes (Signed)
[facility] PROGRESS NOTE  [name] [name] GEX:[id number] DOB: 22-Aug-1926 DOA: 04/26/2015 PCP: Mathews Argyle, MD  Assessment/Plan: Principal Problem:   Pleural effusion on left -Pulmonology on board and currently managing - Patient with chest tube in place - Awaiting cytology reports.  - Pulmonology on board.  Active Problems:   Hyperlipidemia   History of atrial fibrillation - Currently on sotalol for rate control. Holding parameters placed - I suspect patient is not candidate for anticoagulation given frailty, fall risk, and advanced age  Constipation - tap water enema ordered today, Pt has miralax     Diabetes mellitus, controlled - Continue carb modified diet and sliding scale insulin    Acute respiratory failure with hypoxia - Pulmonology on board - continue supplemental oxygen as needed.  Code Status: DNR Family Communication: none at bedside Disposition Plan: Pending improvement in condition   Consultants:  Pulmonologist  Procedures:  Thoracentesis  Antibiotics:  None  HPI/Subjective: Pt has no new complaints.   Objective: Filed Vitals:   04/30/15 1211  BP: 115/38  Pulse: 52  Temp: 98 F (36.7 C)  Resp: 19    Intake/Output Summary (Last 24 hours) at 04/30/15 1416 Last data filed at 04/30/15 0800  Gross per 24 hour  Intake    240 ml  Output    150 ml  Net     90 ml   Filed Weights   04/26/15 1350 04/26/15 1700  Weight: 87.091 kg (192 lb) 87.09 kg (192 lb)    Exam:   General:  Pt in nad, alert and awake  Cardiovascular: rrr, no mrg  Respiratory: decreased breath sounds over left lung base, no wheezes  Abdomen: soft, NT, nd  Musculoskeletal: no cyanosis or clubbing   Data Reviewed: Basic Metabolic Panel:  Recent Labs Lab 04/26/15 1421 04/27/15 0343  NA 134* 134*  K 4.9 4.6  CL 99* 98*  CO2 25 30  GLUCOSE 186* 168*  BUN 16 17  CREATININE 1.09 1.06  CALCIUM 8.6* 8.8*   Liver Function Tests:  Recent  Labs Lab 04/26/15 1958  PROT 6.2*   No results for input(s): LIPASE, AMYLASE in the last 168 hours. No results for input(s): AMMONIA in the last 168 hours. CBC:  Recent Labs Lab 04/26/15 1421 04/27/15 0343 04/27/15 2120  WBC 5.8 6.0 7.7  HGB 11.9* 11.5* 11.9*  HCT 36.2* 35.8* 36.3*  MCV 88.7 88.8 88.8  PLT 178 192 182   Cardiac Enzymes: No results for input(s): CKTOTAL, CKMB, CKMBINDEX, TROPONINI in the last 168 hours. BNP (last 3 results)  Recent Labs  04/26/15 1551  BNP 150.1*    ProBNP (last 3 results)  Recent Labs  05/31/14 0315  PROBNP 7931.0*    CBG:  Recent Labs Lab 04/29/15 1245 04/29/15 1626 04/29/15 2137 04/30/15 0756 04/30/15 1215  GLUCAP 200* 153* 151* 165* 237*    Recent Results (from the past 240 hour(s))  AFB culture with smear     Status: None (Preliminary result)   Collection Time: 04/26/15  7:13 PM  Result Value Ref Range Status   Specimen Description FLUID PLEURAL  Final   Special Requests NONE  Final   Acid Fast Smear   Final    NO ACID FAST BACILLI SEEN Performed at [facility]    Culture   Final    CULTURE WILL BE EXAMINED FOR 6 WEEKS BEFORE ISSUING A FINAL REPORT Performed at [facility]    Report Status PENDING  Incomplete  Body fluid culture  Status: None   Collection Time: 04/26/15  7:13 PM  Result Value Ref Range Status   Specimen Description FLUID PLEURAL  Final   Special Requests NONE  Final   Gram Stain   Final    ABUNDANT WBC PRESENT, PREDOMINANTLY MONONUCLEAR NO ORGANISMS SEEN    Culture NO GROWTH 3 DAYS  Final   Report Status 04/30/2015 FINAL  Final  Fungal stain     Status: None   Collection Time: 04/26/15  7:13 PM  Result Value Ref Range Status   Specimen Description FLUID PLEURAL  Final   Special Requests NONE  Final   Fungal Smear   Final    NO YEAST OR FUNGAL ELEMENTS SEEN Performed at [facility]    Report Status 04/27/2015 FINAL  Final  MRSA PCR Screening      Status: None   Collection Time: 04/27/15  9:26 PM  Result Value Ref Range Status   MRSA by PCR NEGATIVE NEGATIVE Final    Comment:        The GeneXpert MRSA Assay (FDA approved for NASAL specimens only), is one component of a comprehensive MRSA colonization surveillance program. It is not intended to diagnose MRSA infection nor to guide or monitor treatment for MRSA infections.      Studies: Dg Chest Port 1 View  04/30/2015   CLINICAL DATA:  Pleural effusion  EXAM: PORTABLE CHEST 1 VIEW  COMPARISON:  04/27/2015  FINDINGS: Pigtail pleural catheter on the left is unchanged. Minimal left pneumothorax unchanged. Left effusion and left lower lobe infiltrate unchanged.  COPD. Scarring in the right lung base unchanged. Cardiac enlargement without heart failure.  IMPRESSION: Minimal left pneumothorax is unchanged. Left lower lobe infiltrate and effusion also unchanged. No new findings since the prior chest x-ray.   Electronically Signed   By: Franchot Gallo M.D.   On: 04/30/2015 07:02    Scheduled Meds: . escitalopram  10 mg Oral Daily  . finasteride  5 mg Oral Daily  . fluticasone  1 spray Each Nare Daily  . insulin aspart  0-9 Units Subcutaneous TID WC  . senna  1 tablet Oral BID  . sodium chloride  3 mL Intravenous Q12H  . sotalol  80 mg Oral BID  . traMADol  50 mg Oral q morning - 10a   Continuous Infusions:   Time spent: > 35 minutes  Velvet Bathe  [facility] Pager [phone] If 7PM-7AM, please contact night-coverage at [email or web address], password [facility] 04/30/2015, 2:16 PM  LOS: 4 days

## 2015-04-30 NOTE — Progress Notes (Signed)
PCCM PROGRESS NOTE  ADMISSION DATE: 04/26/2015 CONSULT DATE: 04/26/2015 REFERRING PROVIDER: [facility]  CC: Short of breath  SUBJECTIVE: No pain at chest tube site.  Still c/o constipation.  OBJECTIVE: Vitals: Temp:  [97.4 F (36.3 C)-98.3 F (36.8 C)] 98.1 F (36.7 C) (10/02 0754) Pulse Rate:  [49-62] 53 (10/02 0700) Resp:  [12-27] 23 (10/02 0700) BP: (90-138)/(26-96) 133/43 mmHg (10/02 0700) SpO2:  [82 %-97 %] 97 % (10/02 0700)   I/O:  10/01 0701 - 10/02 0700 In: 560 [P.O.:560] Out: 425 [Urine:375; Chest Tube:50]   General: pleasant HEENT: no sinus tenderness Cardiac: regular Chest: decreased BS at bases, Lt chest tube >> no air leak Abd: soft, non tender Ext: no edema Neuro: normal strength Skin: no rashes   CMP Latest Ref Rng 04/27/2015 04/26/2015 06/06/2014  Glucose 65 - 99 mg/dL 168(H) 186(H) 221(H)  BUN 6 - 20 mg/dL '17 16 22  '$ Creatinine 0.61 - 1.24 mg/dL 1.06 1.09 0.81  Sodium 135 - 145 mmol/L 134(L) 134(L) 140  Potassium 3.5 - 5.1 mmol/L 4.6 4.9 4.2  Chloride 101 - 111 mmol/L 98(L) 99(L) 102  CO2 22 - 32 mmol/L '30 25 27  '$ Calcium 8.9 - 10.3 mg/dL 8.8(L) 8.6(L) 8.6  Total Protein 6.5 - 8.1 g/dL - 6.2(L) -  Total Bilirubin 0.3 - 1.2 mg/dL - - -  Alkaline Phos 39 - 117 U/L - - -  AST 0 - 37 U/L - - -  ALT 0 - 53 U/L - - -     CBC Latest Ref Rng 04/27/2015 04/27/2015 04/26/2015  WBC 4.0 - 10.5 K/uL 7.7 6.0 5.8  Hemoglobin 13.0 - 17.0 g/dL 11.9(L) 11.5(L) 11.9(L)  Hematocrit 39.0 - 52.0 % 36.3(L) 35.8(L) 36.2(L)  Platelets 150 - 400 K/uL 182 192 178     Ct Chest Wo Contrast  04/28/2015   CLINICAL DATA:  81 year old male with pleural effusion status post thoracentesis. LEFT chest tube placed.  EXAM: CT CHEST WITHOUT CONTRAST  TECHNIQUE: Multidetector CT imaging of the chest was performed following the standard protocol without IV contrast.  COMPARISON:  Radiograph 04/27/2015  FINDINGS: Mediastinum/Nodes: No axillary or supraclavicular lymphadenopathy. No  mediastinal hilar adenopathy. No pericardial fluid. Mild thickening of the esophagus. Lobular calcification coronary artery calcification.  Lungs/Pleura: LEFT chest tube in place with tiny anterior pneumothorax. Minimal pleural fluid within the LEFT pleural space. There is consolidation within the RIGHT lower lobe and lingula consists with pneumonia. There is a rounded lesion in the LEFT lower lobe anteriorly measuring 34 by 27 mm (image 45, series 2 there is mild reticulation peripherally in the RIGHT lung.  Upper abdomen: Limited view of the liver, kidneys, pancreas are unremarkable. Normal adrenal glands. No aggressive osseous lesion.  Musculoskeletal: No aggressive osseous lesion.  IMPRESSION: 1. Very small anterior LEFT pneumothorax with chest tube at LEFT lung base. 2. Nodular consolidation within the lingula and the LEFT lower lobe consistent with pneumonia versus atelectasis. 3. Rounded mass lesion at the LEFT lung base with differential including rounded pneumonia/atelectasis versus neoplasm. Recommend correlation with pleural fluid cytology.   Electronically Signed   By: Suzy Bouchard M.D.   On: 04/28/2015 11:05   Dg Chest Port 1 View  04/30/2015   CLINICAL DATA:  Pleural effusion  EXAM: PORTABLE CHEST 1 VIEW  COMPARISON:  04/27/2015  FINDINGS: Pigtail pleural catheter on the left is unchanged. Minimal left pneumothorax unchanged. Left effusion and left lower lobe infiltrate unchanged.  COPD. Scarring in the right lung base unchanged. Cardiac enlargement without  heart failure.  IMPRESSION: Minimal left pneumothorax is unchanged. Left lower lobe infiltrate and effusion also unchanged. No new findings since the prior chest x-ray.   Electronically Signed   By: Franchot Gallo M.D.   On: 04/30/2015 07:02     CULTURES: Lt pleural fluid 9/28 >> Lt pleural fluid AFB 9/28 >> Lt pleural fluid fungal 9/28 >>  STUDIES: 9/28 Lt pleural fluid >> protein 4, LDH 218, WBC 2700 (90%L) 9/30 CT chest >>  consolidation RLL and lingula, 3.4 cm rounded lesion LLL  EVENTS: 9/28 Admit 9/29 Lt chest tube placed  DISCUSSION: 81 yo male presented with dyspnea and found to have Lt pleural effusion.  ASSESSMENT/PLAN:  Exudate Lt pleural effusion with lymphocyte predominance. LLL 3.4 cm rounded lesion ?malignancy. Plan: - f/u pleural fluid cytology 9/28 - f/u CXR 10/03 - change chest tube to water seal  Acute hypoxic respiratory failure. Plan: - oxygen to keep SpO2 90 to 95%  Constipation. Plan: - fleets enema  Goals of Care >> DNR  Okay to transfer out of SDU from pulmonary standpoint.  Chesley Mires, MD [facility] Pulmonary/Critical Care 04/30/2015, 8:07 AM Pager:  [phone] After 3pm call: [phone]

## 2015-05-01 ENCOUNTER — Inpatient Hospital Stay (HOSPITAL_COMMUNITY): Payer: Medicare Other

## 2015-05-01 LAB — GLUCOSE, CAPILLARY
GLUCOSE-CAPILLARY: 212 mg/dL — AB (ref 65–99)
Glucose-Capillary: 191 mg/dL — ABNORMAL HIGH (ref 65–99)
Glucose-Capillary: 177 mg/dL — ABNORMAL HIGH (ref 65–99)
Glucose-Capillary: 197 mg/dL — ABNORMAL HIGH (ref 65–99)

## 2015-05-01 MED ORDER — DOCUSATE SODIUM 100 MG PO CAPS
100.0000 mg | ORAL_CAPSULE | Freq: Two times a day (BID) | ORAL | Status: DC
  Administered 2015-05-01 – 2015-05-04 (×5): 100 mg via ORAL
  Filled 2015-05-01 (×6): qty 1

## 2015-05-01 MED ORDER — POTASSIUM CHLORIDE CRYS ER 20 MEQ PO TBCR
20.0000 meq | EXTENDED_RELEASE_TABLET | Freq: Once | ORAL | Status: AC
  Administered 2015-05-01: 20 meq via ORAL
  Filled 2015-05-01: qty 1

## 2015-05-01 MED ORDER — BISACODYL 5 MG PO TBEC
5.0000 mg | DELAYED_RELEASE_TABLET | Freq: Every day | ORAL | Status: DC | PRN
  Administered 2015-05-01: 5 mg via ORAL
  Filled 2015-05-01 (×2): qty 1

## 2015-05-01 MED ORDER — FUROSEMIDE 10 MG/ML IJ SOLN
40.0000 mg | Freq: Once | INTRAMUSCULAR | Status: AC
  Administered 2015-05-01: 40 mg via INTRAVENOUS
  Filled 2015-05-01: qty 4

## 2015-05-01 NOTE — Progress Notes (Signed)
[facility] PROGRESS NOTE  [name] [name] SWN:[id number] DOB: 1927/02/21 DOA: 04/26/2015 PCP: Mathews Argyle, MD  Assessment/Plan: Principal Problem:   Pleural effusion on left -Pulmonology on board and currently managing - Patient with chest tube in place, considering discontinuation depending upon results of chest x-ray and output - Awaiting cytology reports.  - Pulmonology on board.  Active Problems:   Hyperlipidemia   History of atrial fibrillation - Currently on sotalol for rate control. Holding parameters placed - I suspect patient is not candidate for anticoagulation given frailty, fall risk, and advanced age  Constipation - Pt currently on dulcolax - will add colace    Diabetes mellitus, controlled - Continue carb modified diet and sliding scale insulin    Acute respiratory failure with hypoxia - Pulmonology on board - continue supplemental oxygen as needed.  Code Status: DNR Family Communication: none at bedside Disposition Plan: Pending improvement in condition   Consultants:  Pulmonologist  Procedures:  Thoracentesis  Antibiotics:  None  HPI/Subjective: Pt has no new complaints. No acute issues reported overnight.  Objective: Filed Vitals:   05/01/15 1426  BP: 122/69  Pulse: 64  Temp:   Resp: 18    Intake/Output Summary (Last 24 hours) at 05/01/15 1627 Last data filed at 05/01/15 1600  Gross per 24 hour  Intake   1920 ml  Output    325 ml  Net   1595 ml   Filed Weights   04/26/15 1350 04/26/15 1700 05/01/15 0500  Weight: 87.091 kg (192 lb) 87.09 kg (192 lb) 86.229 kg (190 lb 1.6 oz)    Exam:   General:  Pt in nad, alert and awake  Cardiovascular: rrr, no mrg  Respiratory: decreased breath sounds over left lung base, no wheezes  Abdomen: soft, NT, nd  Musculoskeletal: no cyanosis or clubbing   Data Reviewed: Basic Metabolic Panel:  Recent Labs Lab 04/26/15 1421 04/27/15 0343  NA 134* 134*  K 4.9 4.6  CL  99* 98*  CO2 25 30  GLUCOSE 186* 168*  BUN 16 17  CREATININE 1.09 1.06  CALCIUM 8.6* 8.8*   Liver Function Tests:  Recent Labs Lab 04/26/15 1958  PROT 6.2*   No results for input(s): LIPASE, AMYLASE in the last 168 hours. No results for input(s): AMMONIA in the last 168 hours. CBC:  Recent Labs Lab 04/26/15 1421 04/27/15 0343 04/27/15 2120  WBC 5.8 6.0 7.7  HGB 11.9* 11.5* 11.9*  HCT 36.2* 35.8* 36.3*  MCV 88.7 88.8 88.8  PLT 178 192 182   Cardiac Enzymes: No results for input(s): CKTOTAL, CKMB, CKMBINDEX, TROPONINI in the last 168 hours. BNP (last 3 results)  Recent Labs  04/26/15 1551  BNP 150.1*    ProBNP (last 3 results)  Recent Labs  05/31/14 0315  PROBNP 7931.0*    CBG:  Recent Labs Lab 04/30/15 1215 04/30/15 1638 04/30/15 2049 05/01/15 0735 05/01/15 1140  GLUCAP 237* 148* 172* 212* 191*    Recent Results (from the past 240 hour(s))  AFB culture with smear     Status: None (Preliminary result)   Collection Time: 04/26/15  7:13 PM  Result Value Ref Range Status   Specimen Description FLUID PLEURAL  Final   Special Requests NONE  Final   Acid Fast Smear   Final    NO ACID FAST BACILLI SEEN Performed at [facility]    Culture   Final    CULTURE WILL BE EXAMINED FOR 6 WEEKS BEFORE ISSUING A FINAL REPORT Performed at [facility]    Report Status PENDING  Incomplete  Body fluid culture     Status: None   Collection Time: 04/26/15  7:13 PM  Result Value Ref Range Status   Specimen Description FLUID PLEURAL  Final   Special Requests NONE  Final   Gram Stain   Final    ABUNDANT WBC PRESENT, PREDOMINANTLY MONONUCLEAR NO ORGANISMS SEEN    Culture NO GROWTH 3 DAYS  Final   Report Status 04/30/2015 FINAL  Final  Fungal stain     Status: None   Collection Time: 04/26/15  7:13 PM  Result Value Ref Range Status   Specimen Description FLUID PLEURAL  Final   Special Requests NONE  Final   Fungal Smear   Final    NO YEAST  OR FUNGAL ELEMENTS SEEN Performed at [facility]    Report Status 04/27/2015 FINAL  Final  MRSA PCR Screening     Status: None   Collection Time: 04/27/15  9:26 PM  Result Value Ref Range Status   MRSA by PCR NEGATIVE NEGATIVE Final    Comment:        The GeneXpert MRSA Assay (FDA approved for NASAL specimens only), is one component of a comprehensive MRSA colonization surveillance program. It is not intended to diagnose MRSA infection nor to guide or monitor treatment for MRSA infections.      Studies: Dg Chest Port 1 View  05/01/2015   CLINICAL DATA:  Follow-up pleural effusion  EXAM: PORTABLE CHEST 1 VIEW  COMPARISON:  Portable chest x-ray of April 30, 2015  FINDINGS: The lungs are adequately inflated. The interstitial markings remain increased diffusely. The faint pleural line remains visible between the lateral aspects of the left second and third ribs. The small caliber chest tube is present at the left lung base laterally. There is no pneumothorax nor large pleural effusion. The left hemidiaphragm remains obscured. The cardiac silhouette remains enlarged. The pulmonary vascularity is not clearly engorged. The bony thorax is unremarkable where visualized.  IMPRESSION: Stable appearance of the chest since yesterday's study. Trace left apical pneumothorax. There remainspleural fluid layering posteriorly associated with left basilar atelectasis or pneumonia. Stable cardiomegaly and bilaterally increased interstitial markings.   Electronically Signed   By: David  Martinique M.D.   On: 05/01/2015 07:36   Dg Chest Port 1 View  04/30/2015   CLINICAL DATA:  Pleural effusion  EXAM: PORTABLE CHEST 1 VIEW  COMPARISON:  04/27/2015  FINDINGS: Pigtail pleural catheter on the left is unchanged. Minimal left pneumothorax unchanged. Left effusion and left lower lobe infiltrate unchanged.  COPD. Scarring in the right lung base unchanged. Cardiac enlargement without heart failure.  IMPRESSION:  Minimal left pneumothorax is unchanged. Left lower lobe infiltrate and effusion also unchanged. No new findings since the prior chest x-ray.   Electronically Signed   By: Franchot Gallo M.D.   On: 04/30/2015 07:02    Scheduled Meds: . escitalopram  10 mg Oral Daily  . finasteride  5 mg Oral Daily  . fluticasone  1 spray Each Nare Daily  . insulin aspart  0-9 Units Subcutaneous TID WC  . senna  1 tablet Oral BID  . sodium chloride  3 mL Intravenous Q12H  . sotalol  80 mg Oral BID  . traMADol  50 mg Oral q morning - 10a   Continuous Infusions:   Time spent: > 35 minutes  Velvet Bathe  [facility] Pager [phone] If 7PM-7AM, please contact night-coverage at [email or web address], password [facility]  05/01/2015, 4:27 PM  LOS: 5 days

## 2015-05-01 NOTE — Consult Note (Signed)
WOC wound consult note Reason for Consult: Partial thickness tissue loss secondary to intractable urinary incontinence. Patient has an 59-monthhistory of using [place] Currently he is not wearing disposable briefs, but a towel is between his legs. Use of this as a management strategy is discouraged and patient is educated about the abrasiveness of terrycloth and that healthcare providers prefer timely incontinence care and moisture barrier application for optimal skin health. Patient is in agreement with POC. Wound type:Incontinent associated dermatitis with an area of partial thickness tissue loss at the right intragluteal cleft  Pressure Ulcer POA: No Measurement:1cm x 0.4cm x 0.2cm Wound bed: Red with dried serum (scab) formation that is lifting at the periphery Drainage (amount, consistency, odor) scant serous Periwound: erythematous and macerated Dressing procedure/placement/frequency: I will provide the patient with a therapeutic sleep surface with low air loss feature to manage the microclimate (moisture) and provide pressure redistribution.  Patient is wearing pressure redistribution heel boots.  Dressings will be saturated with urine; we will cleanse skin following incontinence with a pH balanced, no rinse cleanser and pat gently dry, following with a light application of a moisture barrier ointment.  Guidance for the Nursing staff is provided in that patient is to be turned side to side and the supine position avoided except for meals.  Heels are intact; we will cleanse and moisturize daily and then place into pressure redistribution heel boots. [facility]ursing team will not follow, but will remain available to this patient, the nursing and medical teams.  Please re-consult if needed. Thanks, LMaudie Flakes MSN, RN, [place]([phone]

## 2015-05-01 NOTE — Care Management Note (Addendum)
Case Management Note  Patient Details  Name: [name] [name] MRN: [id number] Date of Birth: Nov 14, 1926  Subjective/Objective:   Pt admitted from [facility] for SOB. Post thoracentesis and CT placement.                  Action/Plan: CM will continue to monitor for additional needs.    Expected Discharge Date:                  Expected Discharge Plan:  Skilled Nursing Facility  In-House Referral:  Clinical Social Work  Discharge planning Services  CM Consult  Post Acute Care Choice:    Choice offered to:     DME Arranged:    DME Agency:     HH Arranged:    [place] Agency:     Status of Service:  In process, will continue to follow  Medicare Important Message Given:  Yes-second notification given Date Medicare IM Given:    Medicare IM give by:    Date Additional Medicare IM Given:    Additional Medicare Important Message give by:     If discussed at [place] of Stay Meetings, dates discussed:    Additional Comments:  1214 05-04-15 Jacqlyn Krauss, RN,BSN [phone] Plan for return to [facility]. CSW assisting with disposition needs. No further needs from CM at this time.   Bethena Roys, RN 05/01/2015, 3:44 PM

## 2015-05-01 NOTE — Progress Notes (Signed)
PCCM PROGRESS NOTE  ADMISSION DATE: 04/26/2015 CONSULT DATE: 04/26/2015 REFERRING PROVIDER: [facility]  CC: Short of breath  SUBJECTIVE: Still c/o constipation and that is his main complaint.  No chest pain, SOB.  On RA with SpO2 92-93%.  OBJECTIVE: Vitals: Temp:  [97.9 F (36.6 C)-98.6 F (37 C)] 98.2 F (36.8 C) (10/03 0937) Pulse Rate:  [51-62] 62 (10/03 0937) Resp:  [15-21] 20 (10/03 0937) BP: (115-133)/(38-54) 124/54 mmHg (10/03 0937) SpO2:  [96 %-100 %] 99 % (10/03 0937) Weight:  [86.229 kg (190 lb 1.6 oz)] 86.229 kg (190 lb 1.6 oz) (10/03 0500)   I/O:  10/02 0701 - 10/03 0700 In: 1680 [P.O.:1680] Out: 100 [Urine:100]   General: pleasant elderly male, eating lunch and visiting with family HEENT: no sinus tenderness Cardiac: IRIR, 3/6 SEM Chest: decreased BS at bases, Lt chest tube >> no air leak Abd: soft, non tender Ext: no edema Neuro: normal strength Skin: no rashes   CMP Latest Ref Rng 04/27/2015 04/26/2015 06/06/2014  Glucose 65 - 99 mg/dL 168(H) 186(H) 221(H)  BUN 6 - 20 mg/dL '17 16 22  '$ Creatinine 0.61 - 1.24 mg/dL 1.06 1.09 0.81  Sodium 135 - 145 mmol/L 134(L) 134(L) 140  Potassium 3.5 - 5.1 mmol/L 4.6 4.9 4.2  Chloride 101 - 111 mmol/L 98(L) 99(L) 102  CO2 22 - 32 mmol/L '30 25 27  '$ Calcium 8.9 - 10.3 mg/dL 8.8(L) 8.6(L) 8.6  Total Protein 6.5 - 8.1 g/dL - 6.2(L) -  Total Bilirubin 0.3 - 1.2 mg/dL - - -  Alkaline Phos 39 - 117 U/L - - -  AST 0 - 37 U/L - - -  ALT 0 - 53 U/L - - -     CBC Latest Ref Rng 04/27/2015 04/27/2015 04/26/2015  WBC 4.0 - 10.5 K/uL 7.7 6.0 5.8  Hemoglobin 13.0 - 17.0 g/dL 11.9(L) 11.5(L) 11.9(L)  Hematocrit 39.0 - 52.0 % 36.3(L) 35.8(L) 36.2(L)  Platelets 150 - 400 K/uL 182 192 178     Dg Chest Port 1 View  05/01/2015   CLINICAL DATA:  Follow-up pleural effusion  EXAM: PORTABLE CHEST 1 VIEW  COMPARISON:  Portable chest x-ray of April 30, 2015  FINDINGS: The lungs are adequately inflated. The interstitial markings remain  increased diffusely. The faint pleural line remains visible between the lateral aspects of the left second and third ribs. The small caliber chest tube is present at the left lung base laterally. There is no pneumothorax nor large pleural effusion. The left hemidiaphragm remains obscured. The cardiac silhouette remains enlarged. The pulmonary vascularity is not clearly engorged. The bony thorax is unremarkable where visualized.  IMPRESSION: Stable appearance of the chest since yesterday's study. Trace left apical pneumothorax. There remainspleural fluid layering posteriorly associated with left basilar atelectasis or pneumonia. Stable cardiomegaly and bilaterally increased interstitial markings.   Electronically Signed   By: David  Martinique M.D.   On: 05/01/2015 07:36   Dg Chest Port 1 View  04/30/2015   CLINICAL DATA:  Pleural effusion  EXAM: PORTABLE CHEST 1 VIEW  COMPARISON:  04/27/2015  FINDINGS: Pigtail pleural catheter on the left is unchanged. Minimal left pneumothorax unchanged. Left effusion and left lower lobe infiltrate unchanged.  COPD. Scarring in the right lung base unchanged. Cardiac enlargement without heart failure.  IMPRESSION: Minimal left pneumothorax is unchanged. Left lower lobe infiltrate and effusion also unchanged. No new findings since the prior chest x-ray.   Electronically Signed   By: Franchot Gallo M.D.   On: 04/30/2015 07:02  CULTURES: Lt pleural fluid 9/28 >> Lt pleural fluid AFB 9/28 >> Lt pleural fluid fungal 9/28 >>  STUDIES: 9/28 Lt pleural fluid >> protein 4, LDH 218, WBC 2700 (90%L) 9/30 CT chest >> consolidation RLL and lingula, 3.4 cm rounded lesion LLL  EVENTS: 9/28 Admit 9/29 Lt chest tube placed  DISCUSSION: 81 yo male presented with dyspnea and found to have Lt pleural effusion.  ASSESSMENT/PLAN:  Exudate Lt pleural effusion with lymphocyte predominance. LLL 3.4 cm rounded lesion ?malignancy. Plan: - f/u pleural fluid cytology 9/28 - NOTE:   Family wants to ensure that they are around and with pt when cytology has resulted and this news is relayed to pt.  If no family available, please call Tami Lin (daughter) at [phone]).  - f/u CXR 10/04 - continue chest tube to water seal - lasix '40mg'$  x 1  Acute hypoxic respiratory failure. Plan: - oxygen to keep SpO2 90 to 95% - lasix as above  Constipation. Plan: - continue miralax, senna. - add dulcolax. - encourage fiber intake.  Goals of Care >> DNR  NOTE:  Family wants to ensure that they are around and with pt when cytology has resulted and this news is relayed to pt.  If no family available, please call Tami Lin (daughter) at [phone]).    Montey Hora, [place] [facility] Pager: [phone]  or [phone] 05/01/2015, 11:59 AM

## 2015-05-01 NOTE — Care Management Important Message (Signed)
Important Message  Patient Details  Name: [name] [name] MRN: [id number] Date of Birth: Oct 27, 1926   Medicare Important Message Given:  Yes-second notification given    Delorse Lek 05/01/2015, 11:55 AM

## 2015-05-01 NOTE — Clinical Social Work Note (Signed)
Updates regarding chest tube sent to [facility].   Liz Beach MSW, [place], [place], [phone]

## 2015-05-02 ENCOUNTER — Inpatient Hospital Stay (HOSPITAL_COMMUNITY): Payer: Medicare Other

## 2015-05-02 ENCOUNTER — Telehealth: Payer: Self-pay | Admitting: Pulmonary Disease

## 2015-05-02 DIAGNOSIS — J984 Other disorders of lung: Secondary | ICD-10-CM

## 2015-05-02 LAB — GLUCOSE, CAPILLARY
GLUCOSE-CAPILLARY: 231 mg/dL — AB (ref 65–99)
GLUCOSE-CAPILLARY: 227 mg/dL — AB (ref 65–99)
Glucose-Capillary: 198 mg/dL — ABNORMAL HIGH (ref 65–99)

## 2015-05-02 MED ORDER — FENTANYL CITRATE (PF) 100 MCG/2ML IJ SOLN
25.0000 ug | Freq: Once | INTRAMUSCULAR | Status: AC
  Administered 2015-05-02: 25 ug via INTRAVENOUS
  Filled 2015-05-02: qty 2

## 2015-05-02 MED ORDER — OXYCODONE HCL 5 MG PO TABS
5.0000 mg | ORAL_TABLET | Freq: Four times a day (QID) | ORAL | Status: DC | PRN
  Administered 2015-05-02 – 2015-05-04 (×5): 5 mg via ORAL
  Filled 2015-05-02 (×6): qty 1

## 2015-05-02 NOTE — Progress Notes (Signed)
[facility] PROGRESS NOTE  [name] [name] DPO:[id number] DOB: August 21, 1926 DOA: 04/26/2015 PCP: Mathews Argyle, MD  Brief Narrative: Patient is an 81 year old with history of atrial fibrillation on anticoagulation, history of pulmonary fibrosis, AAA, hypertension, diabetes that initially presented with shortness of breath. Further evaluation would reveal x-ray which showed pleural effusion and complete left hemithorax opacification. Pulmonology was consulted and chest tube placed.  Assessment/Plan: Principal Problem:   Pleural effusion on left -Pulmonology on board and currently managing - Patient with chest tube in place, plans are to discontinue chest tube 05/02/15 - Awaiting cytology reports.  - Pulmonology on board.  Active Problems:   Hyperlipidemia   History of atrial fibrillation - Currently on sotalol for rate control. Holding parameters placed - I suspect patient is not candidate for anticoagulation given frailty, fall risk, and advanced age  Constipation - Pt currently on dulcolax - added colace    Diabetes mellitus, controlled - Continue carb modified diet and sliding scale insulin    Acute respiratory failure with hypoxia - Pulmonology on board - continue supplemental oxygen as needed.  Code Status: DNR Family Communication: none at bedside Disposition Plan: Pending improvement in condition   Consultants:  Pulmonologist  Procedures:  Thoracentesis  Chest tube placement  Antibiotics:  None  HPI/Subjective: Pt has no new complaints. No acute issues reported overnight.  Objective: Filed Vitals:   05/02/15 0500  BP: 125/61  Pulse: 60  Temp: 97.7 F (36.5 C)  Resp: 18    Intake/Output Summary (Last 24 hours) at 05/02/15 1745 Last data filed at 05/02/15 1246  Gross per 24 hour  Intake    540 ml  Output    150 ml  Net    390 ml   Filed Weights   04/26/15 1700 05/01/15 0500 05/02/15 0500  Weight: 87.09 kg (192 lb) 86.229 kg (190 lb  1.6 oz) 82.101 kg (181 lb)    Exam:   General:  Pt in nad, alert and awake  Cardiovascular: rrr, no mrg  Respiratory: decreased breath sounds over left lung base, no wheezes  Abdomen: soft, NT, nd  Musculoskeletal: no cyanosis or clubbing   Data Reviewed: Basic Metabolic Panel:  Recent Labs Lab 04/26/15 1421 04/27/15 0343  NA 134* 134*  K 4.9 4.6  CL 99* 98*  CO2 25 30  GLUCOSE 186* 168*  BUN 16 17  CREATININE 1.09 1.06  CALCIUM 8.6* 8.8*   Liver Function Tests:  Recent Labs Lab 04/26/15 1958  PROT 6.2*   No results for input(s): LIPASE, AMYLASE in the last 168 hours. No results for input(s): AMMONIA in the last 168 hours. CBC:  Recent Labs Lab 04/26/15 1421 04/27/15 0343 04/27/15 2120  WBC 5.8 6.0 7.7  HGB 11.9* 11.5* 11.9*  HCT 36.2* 35.8* 36.3*  MCV 88.7 88.8 88.8  PLT 178 192 182   Cardiac Enzymes: No results for input(s): CKTOTAL, CKMB, CKMBINDEX, TROPONINI in the last 168 hours. BNP (last 3 results)  Recent Labs  04/26/15 1551  BNP 150.1*    ProBNP (last 3 results)  Recent Labs  05/31/14 0315  PROBNP 7931.0*    CBG:  Recent Labs Lab 05/01/15 1624 05/01/15 2046 05/02/15 0742 05/02/15 1129 05/02/15 1632  GLUCAP 177* 197* 198* 231* 227*    Recent Results (from the past 240 hour(s))  AFB culture with smear     Status: None (Preliminary result)   Collection Time: 04/26/15  7:13 PM  Result Value Ref Range Status   Specimen Description FLUID PLEURAL  Final   Special Requests NONE  Final   Acid Fast Smear   Final    NO ACID FAST BACILLI SEEN Performed at [facility]    Culture   Final    CULTURE WILL BE EXAMINED FOR 6 WEEKS BEFORE ISSUING A FINAL REPORT Performed at [facility]    Report Status PENDING  Incomplete  Body fluid culture     Status: None   Collection Time: 04/26/15  7:13 PM  Result Value Ref Range Status   Specimen Description FLUID PLEURAL  Final   Special Requests NONE  Final   Gram  Stain   Final    ABUNDANT WBC PRESENT, PREDOMINANTLY MONONUCLEAR NO ORGANISMS SEEN    Culture NO GROWTH 3 DAYS  Final   Report Status 04/30/2015 FINAL  Final  Fungal stain     Status: None   Collection Time: 04/26/15  7:13 PM  Result Value Ref Range Status   Specimen Description FLUID PLEURAL  Final   Special Requests NONE  Final   Fungal Smear   Final    NO YEAST OR FUNGAL ELEMENTS SEEN Performed at [facility]    Report Status 04/27/2015 FINAL  Final  MRSA PCR Screening     Status: None   Collection Time: 04/27/15  9:26 PM  Result Value Ref Range Status   MRSA by PCR NEGATIVE NEGATIVE Final    Comment:        The GeneXpert MRSA Assay (FDA approved for NASAL specimens only), is one component of a comprehensive MRSA colonization surveillance program. It is not intended to diagnose MRSA infection nor to guide or monitor treatment for MRSA infections.      Studies: Dg Chest Port 1 View  05/02/2015   CLINICAL DATA:  Status post left chest tube removal  EXAM: PORTABLE CHEST - 1 VIEW  COMPARISON:  05/02/2015  FINDINGS: Cardiac shadow is within normal limits. Persistent left basilar infiltrate is seen. No recurrent pneumothorax is noted following chest tube removal. The right lung remains clear. No acute bony abnormality is seen.  IMPRESSION: No recurrent pneumothorax following chest tube removal.   Electronically Signed   By: Inez Catalina M.D.   On: 05/02/2015 16:03   Dg Chest Port 1 View  05/02/2015   CLINICAL DATA:  Respiratory failure.  EXAM: PORTABLE CHEST 1 VIEW  COMPARISON:  05/01/2015.  FINDINGS: Left chest tube in stable position. No pneumothorax noted on today's exam. Persistent left lower lobe infiltrate with small pleural effusion noted. Persistent cardiomegaly. No pulmonary venous congestion.  IMPRESSION: 1. Left chest tube in stable position. No pneumothorax noted on today's exam. 2. Persistent left lower lobe infiltrate and small left pleural effusion. 3.  Stable cardiomegaly.   Electronically Signed   By: Marcello Moores  Register   On: 05/02/2015 07:22   Dg Chest Port 1 View  05/01/2015   CLINICAL DATA:  Follow-up pleural effusion  EXAM: PORTABLE CHEST 1 VIEW  COMPARISON:  Portable chest x-ray of April 30, 2015  FINDINGS: The lungs are adequately inflated. The interstitial markings remain increased diffusely. The faint pleural line remains visible between the lateral aspects of the left second and third ribs. The small caliber chest tube is present at the left lung base laterally. There is no pneumothorax nor large pleural effusion. The left hemidiaphragm remains obscured. The cardiac silhouette remains enlarged. The pulmonary vascularity is not clearly engorged. The bony thorax is unremarkable where visualized.  IMPRESSION: Stable appearance of the chest since yesterday's  study. Trace left apical pneumothorax. There remainspleural fluid layering posteriorly associated with left basilar atelectasis or pneumonia. Stable cardiomegaly and bilaterally increased interstitial markings.   Electronically Signed   By: David  Martinique M.D.   On: 05/01/2015 07:36    Scheduled Meds: . docusate sodium  100 mg Oral BID  . escitalopram  10 mg Oral Daily  . finasteride  5 mg Oral Daily  . fluticasone  1 spray Each Nare Daily  . insulin aspart  0-9 Units Subcutaneous TID WC  . senna  1 tablet Oral BID  . sodium chloride  3 mL Intravenous Q12H  . sotalol  80 mg Oral BID  . traMADol  50 mg Oral q morning - 10a   Continuous Infusions:   Time spent: > 35 minutes  Velvet Bathe  [facility] Pager [phone] If 7PM-7AM, please contact night-coverage at [email or web address], password [facility] 05/02/2015, 5:45 PM  LOS: 6 days

## 2015-05-02 NOTE — Telephone Encounter (Signed)
Attempted to contact number listed and it was a fax number.   pt daughter calling stating that she got message from him and that she will awaiting hearing from him on [email or web address] and that she appreciates him keeping in contact   FYI to Dr. Ashok Cordia

## 2015-05-02 NOTE — Progress Notes (Signed)
PCCM PROGRESS NOTE  ADMISSION DATE: 04/26/2015 CONSULT DATE: 04/26/2015 REFERRING PROVIDER: [facility]  CC: Short of breath  SUBJECTIVE: Denies any chest pain or pressure. No subjective fever, chills, or sweats. Left chest tube only had 50 mL output total from last 24 hours.  REVIEW OF SYSTEMS: No significant cough or dyspnea. No nausea, vomiting, or diarrhea.  OBJECTIVE: Vitals: Temp:  [97.7 F (36.5 C)-98.8 F (37.1 C)] 97.7 F (36.5 C) (10/04 0500) Pulse Rate:  [60-64] 60 (10/04 0500) Resp:  [18-20] 18 (10/04 0500) BP: (105-126)/(41-69) 125/61 mmHg (10/04 0500) SpO2:  [92 %-99 %] 92 % (10/04 0500) Weight:  [181 lb (82.101 kg)] 181 lb (82.101 kg) (10/04 0500)   I/O:  10/03 0701 - 10/04 0700 In: 1020 [P.O.:1020] Out: 475 [Urine:425; Chest Tube:50]  General:  Elderly male. Awake. Alert. No acute distress.   Integument:  Warm & dry. No rash on exposed skin. Left chest tube in place.  HEENT:  Moist mucus membranes. No oral ulcers. No scleral injection or icterus.  Cardiovascular:  Regular rate. No appreciable JVD.  Pulmonary:  Decreased aeration bilateral lung bases left greater than right. Symmetric chest wall expansion. No accessory muscle use on room air or nasal cannula. Abdomen: Soft. Normal bowel sounds. Nondistended. Grossly nontender. Musculoskeletal:  Normal bulk and tone. Hand grip strength 5/5 bilaterally. No joint deformity or effusion appreciated.   CMP Latest Ref Rng 04/27/2015 04/26/2015 06/06/2014  Glucose 65 - 99 mg/dL 168(H) 186(H) 221(H)  BUN 6 - 20 mg/dL '17 16 22  '$ Creatinine 0.61 - 1.24 mg/dL 1.06 1.09 0.81  Sodium 135 - 145 mmol/L 134(L) 134(L) 140  Potassium 3.5 - 5.1 mmol/L 4.6 4.9 4.2  Chloride 101 - 111 mmol/L 98(L) 99(L) 102  CO2 22 - 32 mmol/L '30 25 27  '$ Calcium 8.9 - 10.3 mg/dL 8.8(L) 8.6(L) 8.6  Total Protein 6.5 - 8.1 g/dL - 6.2(L) -  Total Bilirubin 0.3 - 1.2 mg/dL - - -  Alkaline Phos 39 - 117 U/L - - -  AST 0 - 37 U/L - - -  ALT 0 - 53 U/L - - -      CBC Latest Ref Rng 04/27/2015 04/27/2015 04/26/2015  WBC 4.0 - 10.5 K/uL 7.7 6.0 5.8  Hemoglobin 13.0 - 17.0 g/dL 11.9(L) 11.5(L) 11.9(L)  Hematocrit 39.0 - 52.0 % 36.3(L) 35.8(L) 36.2(L)  Platelets 150 - 400 K/uL 182 192 178     Dg Chest Port 1 View  05/02/2015   CLINICAL DATA:  Respiratory failure.  EXAM: PORTABLE CHEST 1 VIEW  COMPARISON:  05/01/2015.  FINDINGS: Left chest tube in stable position. No pneumothorax noted on today's exam. Persistent left lower lobe infiltrate with small pleural effusion noted. Persistent cardiomegaly. No pulmonary venous congestion.  IMPRESSION: 1. Left chest tube in stable position. No pneumothorax noted on today's exam. 2. Persistent left lower lobe infiltrate and small left pleural effusion. 3. Stable cardiomegaly.   Electronically Signed   By: Marcello Moores  Register   On: 05/02/2015 07:22   Dg Chest Port 1 View  05/01/2015   CLINICAL DATA:  Follow-up pleural effusion  EXAM: PORTABLE CHEST 1 VIEW  COMPARISON:  Portable chest x-ray of April 30, 2015  FINDINGS: The lungs are adequately inflated. The interstitial markings remain increased diffusely. The faint pleural line remains visible between the lateral aspects of the left second and third ribs. The small caliber chest tube is present at the left lung base laterally. There is no pneumothorax nor large pleural effusion. The left hemidiaphragm  remains obscured. The cardiac silhouette remains enlarged. The pulmonary vascularity is not clearly engorged. The bony thorax is unremarkable where visualized.  IMPRESSION: Stable appearance of the chest since yesterday's study. Trace left apical pneumothorax. There remainspleural fluid layering posteriorly associated with left basilar atelectasis or pneumonia. Stable cardiomegaly and bilaterally increased interstitial markings.   Electronically Signed   By: David  Martinique M.D.   On: 05/01/2015 07:36    CULTURES: Lt pleural fluid 9/28 - negative Lt pleural fluid AFB 9/28 >>  smear neg/pending Lt pleural fluid fungal 9/28 >>smear neg/pending  STUDIES: 9/28 Lt pleural fluid >> protein 4, LDH 218, WBC 2700 (90%L). ADA 6.4 9/30 CT chest >> consolidation RLL and lingula, 3.4 cm rounded lesion LLL  EVENTS: 9/28 Admit 9/29 Lt chest tube placed 10/2 Chest tube to water seal  ASSESSMENT/PLAN: 81 year old male with exudative left pleural effusion. Status post chest tube 9/29. Cultures have not yielded an infectious etiology to the pleural effusion. Still highly suspicious of malignancy as the etiology. I had a lengthy discussion with the patient's daughter via phone yesterday and we are planning for a family meeting around noon today with the hope that his pleural fluid cytology will be available at that time. Given the stability of the patient's chest exam and minimal output I feel it's reasonable to discontinue his chest tube today.  1. Exudative left pleural effusion: Awaiting pleural fluid cytology. Continuing to follow pleural fluid AFB & fungal cultures to completion. Plan to remove chest tube today given minimal output. 2. Left lower lobe rounded lesion/mass: Awaiting pleural fluid cytology. Patient will need outpatient PET CT scanning. 3. Acute hypoxic respiratory failure: Continue to wean FiO2 for saturation greater than 92%. 4. Constipation: MiraLAX, Dulcolax, & senna.  Sonia Baller Ashok Cordia, M.D. [facility] Pager:  [phone] After 3pm or if no response, call [phone] 05/02/2015, 8:24 AM

## 2015-05-02 NOTE — Progress Notes (Signed)
Inpatient Diabetes Program Recommendations  AACE/ADA: New Consensus Statement on Inpatient Glycemic Control (2015)  Target Ranges:  Prepandial:   less than 140 mg/dL      Peak postprandial:   less than 180 mg/dL (1-2 hours)      Critically ill patients:  140 - 180 mg/dL   Results for [name] [name], [name] [name] (MRN [id number]) as of 05/02/2015 09:47  Ref. Range 05/01/2015 07:35 05/01/2015 11:40 05/01/2015 16:24 05/01/2015 20:46  Glucose-Capillary Latest Ref Range: 65-99 mg/dL 212 (H) 191 (H) 177 (H) 197 (H)    Results for [name] [name], [name] [name] (MRN [id number]) as of 05/02/2015 09:47  Ref. Range 05/02/2015 07:42  Glucose-Capillary Latest Ref Range: 65-99 mg/dL 198 (H)    Admit Pleural Effusion.   History: DM, HTN  Home DM Meds: Glipizide 10 mg daily       Metformin 1000 mg bid   Current Insulin Orders: Novolog Sensitive SSI (0-9 units) TID AC    MD- Please consider starting low dose basal insulin for this patient while patient hospitalized to help better control CBGs-  Lantus 8 units QHS (0.1 units/kg dosing)     ----Will follow patient during hospitalization----  Wyn Quaker RN, MSN, CDE Diabetes Coordinator Inpatient Glycemic Control Team Team Pager: [phone] (8a-5p)

## 2015-05-02 NOTE — Clinical Social Work Note (Signed)
Clinical Social Worker continuing to follow patient and family for support and discharge planning needs.  Patient is from [facility] and will return at discharge.  CSW updated Candace at [facility] regarding chest tube removal - facility in agreement with patient return and ready for patient once medically stable.  CSW remains available for support and to facilitate patient discharge needs.  Barbette Or, [place]  ([place], [place])

## 2015-05-02 NOTE — Progress Notes (Signed)
PCCM Interval Progress Note  Left sided pigtail chest tube pulled at 9507 without complications.  Pt tolerated procedure well.  Incision site with sanguinous drainage.  Covered with petroleum gauze and 4x4's then secured with hypofix tape.  Called back to bedside around 1645 due to large amounts of sanguinous drainage from chest tube site.  Drainage soaking threw all dressings.  1 simple interrupted suture placed in center of incision to approximate skin edges and hopefully decrease drainage.  F/u CXR for 1800 ordered.   Montey Hora, [place] [facility] Pager: [phone]  or [phone] 05/02/2015, 5:03 PM

## 2015-05-02 NOTE — Progress Notes (Signed)
Patient had left chest tube removed around 1530 by critical care.  Left chest tube removal dressing saturated with sanquinous drainage.  Dr. Titus Mould paged and notified.  Stated someone would be up to see patient.  Sanda Linger

## 2015-05-03 ENCOUNTER — Inpatient Hospital Stay (HOSPITAL_COMMUNITY): Payer: Medicare Other

## 2015-05-03 DIAGNOSIS — Z7189 Other specified counseling: Secondary | ICD-10-CM

## 2015-05-03 DIAGNOSIS — J939 Pneumothorax, unspecified: Secondary | ICD-10-CM | POA: Insufficient documentation

## 2015-05-03 DIAGNOSIS — Z8679 Personal history of other diseases of the circulatory system: Secondary | ICD-10-CM

## 2015-05-03 DIAGNOSIS — R918 Other nonspecific abnormal finding of lung field: Secondary | ICD-10-CM

## 2015-05-03 LAB — GLUCOSE, CAPILLARY
GLUCOSE-CAPILLARY: 253 mg/dL — AB (ref 65–99)
GLUCOSE-CAPILLARY: 321 mg/dL — AB (ref 65–99)
GLUCOSE-CAPILLARY: 206 mg/dL — AB (ref 65–99)
Glucose-Capillary: 119 mg/dL — ABNORMAL HIGH (ref 65–99)

## 2015-05-03 LAB — ANA, BODY FLUID: ANTI-NUCLEAR AB, IGG: NOT DETECTED

## 2015-05-03 NOTE — Progress Notes (Signed)
Inpatient Diabetes Program Recommendations  AACE/ADA: New Consensus Statement on Inpatient Glycemic Control (2015)  Target Ranges:  Prepandial:   less than 140 mg/dL      Peak postprandial:   less than 180 mg/dL (1-2 hours)      Critically ill patients:  140 - 180 mg/dL   Results for [name] [name], [name] [name] (MRN [id number]) as of 05/03/2015 11:38  Ref. Range 05/03/2015 07:59  Glucose-Capillary Latest Ref Range: 65-99 mg/dL 253 (H)   Review of Glycemic Control   History: DM, HTN  Home DM Meds: Glipizide 10 mg daily  Metformin 1000 mg bid   Current Insulin Orders: Novolog Sensitive SSI (0-9 units) TID AC   MD- Fasting glucose was 253 this am. Please consider starting low dose basal insulin for this patient while patient hospitalized to help better control CBGs-  Lantus 8 units QHS (0.1 units/kg dosing)  Thanks,  Tama Headings RN, MSN, [facility] Inpatient Diabetes Coordinator Team Pager [phone] (8a-5p)

## 2015-05-03 NOTE — Progress Notes (Signed)
PCCM PROGRESS NOTE  ADMISSION DATE: 04/26/2015 CONSULT DATE: 04/26/2015 REFERRING PROVIDER: [facility]  CC: Short of breath  SUBJECTIVE: No dyspnea or coughing. Patient is anxiously awaiting results of pleural fluid cytology. High clinical suspicion for malignancy at this time. Chest tube was removed yesterday (10/4) at bedside. He did have output from prior chest tube insertion site which required placement of a seizure bedside.  REVIEW OF SYSTEMS: No chest pain or pressure. No palpitations. Denies any nausea, vomiting, or diarrhea.  OBJECTIVE: Vitals: Temp:  [97.9 F (36.6 C)-98.3 F (36.8 C)] 98.3 F (36.8 C) (10/05 0600) Pulse Rate:  [58-59] 59 (10/05 0600) Resp:  [16-18] 18 (10/05 0600) BP: (117-128)/(50-65) 128/65 mmHg (10/05 0949) SpO2:  [95 %-97 %] 96 % (10/05 0600)   I/O:  10/04 0701 - 10/05 0700 In: 240 [P.O.:240] Out: -   General:  Elderly male. Awake. Alert. Laying in bed eating graham crackers watching TV.  Integument:  Warm & dry. No rash on exposed skin. Left chest tube site with dressing in place clean & dry. HEENT:  Moist mucus membranes. No oral ulcers. No scleral injection.  Cardiovascular:  Regular rate. No appreciable JVD.  Pulmonary:  Improved aeration left lung base. Otherwise clear to auscultation. Symmetric chest wall expansion. No accessory muscle use on room air. Abdomen: Soft. Normal bowel sounds. Nontender. Musculoskeletal:  Normal bulk and tone. Hand grip strength 5/5 bilaterally. No joint deformity or effusion appreciated.   CMP Latest Ref Rng 04/27/2015 04/26/2015 06/06/2014  Glucose 65 - 99 mg/dL 168(H) 186(H) 221(H)  BUN 6 - 20 mg/dL '17 16 22  '$ Creatinine 0.61 - 1.24 mg/dL 1.06 1.09 0.81  Sodium 135 - 145 mmol/L 134(L) 134(L) 140  Potassium 3.5 - 5.1 mmol/L 4.6 4.9 4.2  Chloride 101 - 111 mmol/L 98(L) 99(L) 102  CO2 22 - 32 mmol/L '30 25 27  '$ Calcium 8.9 - 10.3 mg/dL 8.8(L) 8.6(L) 8.6  Total Protein 6.5 - 8.1 g/dL - 6.2(L) -  Total Bilirubin 0.3 -  1.2 mg/dL - - -  Alkaline Phos 39 - 117 U/L - - -  AST 0 - 37 U/L - - -  ALT 0 - 53 U/L - - -     CBC Latest Ref Rng 04/27/2015 04/27/2015 04/26/2015  WBC 4.0 - 10.5 K/uL 7.7 6.0 5.8  Hemoglobin 13.0 - 17.0 g/dL 11.9(L) 11.5(L) 11.9(L)  Hematocrit 39.0 - 52.0 % 36.3(L) 35.8(L) 36.2(L)  Platelets 150 - 400 K/uL 182 192 178     Dg Chest Port 1 View  05/03/2015   CLINICAL DATA:  81 year old male chest tube removal, pneumothorax after left thoracentesis. Initial encounter.  EXAM: PORTABLE CHEST 1 VIEW  COMPARISON:  05/02/2015 and earlier.  FINDINGS: Portable AP semi upright view at 0701 hours. Stable lung volumes. Continued confluent opacity at the left lung base. Left chest tube was removed yesterday. Trace left apical pneumothorax is stable. Stable cardiac size and mediastinal contours. No new pulmonary opacity.  IMPRESSION: 1. Stable trace left apical pneumothorax. 2. Stable lung volumes with confluent left lung base opacity. 3. No new cardiopulmonary abnormality.   Electronically Signed   By: Genevie Ann M.D.   On: 05/03/2015 08:16   Dg Chest Port 1 View  05/02/2015   CLINICAL DATA:  Left-sided chest tube removal.  EXAM: PORTABLE CHEST 1 VIEW  COMPARISON:  May 02, 2015.  FINDINGS: Minimal left apical pneumothorax is noted. Stable left basilar opacity is noted concerning for pneumonia or atelectasis with associated pleural effusion. Stable minimal right  basilar subsegmental atelectasis or scarring is noted. Stable cardiomediastinal silhouette.  IMPRESSION: Minimal left apical pneumothorax is noted. Stable left basilar opacity is noted concerning for pneumonia or atelectasis with associated pleural effusion. Stable minimal right basilar subsegmental atelectasis or scarring is noted.   Electronically Signed   By: Marijo Conception, M.D.   On: 05/02/2015 18:42   Dg Chest Port 1 View  05/02/2015   CLINICAL DATA:  Status post left chest tube removal  EXAM: PORTABLE CHEST - 1 VIEW  COMPARISON:  05/02/2015   FINDINGS: Cardiac shadow is within normal limits. Persistent left basilar infiltrate is seen. No recurrent pneumothorax is noted following chest tube removal. The right lung remains clear. No acute bony abnormality is seen.  IMPRESSION: No recurrent pneumothorax following chest tube removal.   Electronically Signed   By: Inez Catalina M.D.   On: 05/02/2015 16:03   Dg Chest Port 1 View  05/02/2015   CLINICAL DATA:  Respiratory failure.  EXAM: PORTABLE CHEST 1 VIEW  COMPARISON:  05/01/2015.  FINDINGS: Left chest tube in stable position. No pneumothorax noted on today's exam. Persistent left lower lobe infiltrate with small pleural effusion noted. Persistent cardiomegaly. No pulmonary venous congestion.  IMPRESSION: 1. Left chest tube in stable position. No pneumothorax noted on today's exam. 2. Persistent left lower lobe infiltrate and small left pleural effusion. 3. Stable cardiomegaly.   Electronically Signed   By: Marcello Moores  Register   On: 05/02/2015 07:22    CULTURES: Lt pleural fluid 9/28 - negative Lt pleural fluid AFB 9/28 >> smear neg/pending Lt pleural fluid fungal 9/28 >>smear neg/pending  STUDIES: 9/28 Lt pleural fluid >> protein 4, LDH 218, WBC 2700 (90%L). ADA 6.4 9/30 CT chest >> consolidation RLL and lingula, 3.4 cm rounded lesion LLL  EVENTS: 9/28 Admit 9/29 Lt chest tube placed 10/2 Chest tube to water seal  ASSESSMENT/PLAN: 80 year old male with exudative left pleural effusion. Status post chest tube 9/29 now removed on 10/4. Cultures have not yielded an infectious etiology to the pleural effusion. Still highly suspicious of malignancy as the etiology. Continuing to wait on the results of pleural fluid cytology. The suture required to close the chest tube insertion site can likely be removed in 5-7 days. He does have a small left apical pneumothorax that is stable on repeat imaging. This is likely secondary to chest tube removal.  1. Exudative left pleural effusion: Awaiting  pleural fluid cytology. Continuing to follow pleural fluid AFB & fungal cultures to completion. Left chest tube removed. Plan for removal of suture and 5/7 days from chest tube insertion site. 2. Tiny left apical pneumothorax: Stable. Likely secondary to left chest tube removal. Plan for repeat chest x-ray PA/LAT in the morning. 3. Left lower lobe rounded lesion/mass: Awaiting pleural fluid cytology. Patient will need outpatient PET CT scanning. 4. Acute hypoxic respiratory failure: Improving. Continue to wean FiO2 for saturation greater than 92%. 5. Constipation: MiraLAX, Dulcolax, & senna.  Sonia Baller Ashok Cordia, M.D. [facility] Pulmonary & Critical Care Pager:  [phone] After 3pm or if no response, call [phone] 05/03/2015, 10:28 AM

## 2015-05-03 NOTE — Progress Notes (Signed)
PROGRESS NOTE  [name] [name] GGY:[id number] DOB: [birth year] DOA: 04/26/2015 PCP: Mathews Argyle, MD  HPI: Patient is an 81 year old with history of atrial fibrillation on anticoagulation, history of pulmonary fibrosis, AAA, hypertension, diabetes that initially presented with shortness of breath. Further evaluation would reveal x-ray which showed pleural effusion and complete left hemithorax opacification. Pulmonology was consulted and chest tube placed.  Subjective / 24 H Interval events - feeling better, breathing better - no chest pain, shortness of breath, no abdominal pain, nausea or vomiting.   Assessment/Plan: Principal Problem:   Pleural effusion on left Active Problems:   Hyperlipidemia   History of atrial fibrillation   Diabetes mellitus, controlled ([place])   Acute respiratory failure with hypoxia (HCC)   Pleural effusion, left   Pressure ulcer   Pleural effusion on left - Pulmonology following, s/p chest tube, removed 10/4. CXR today stable.  - Awaiting cytology reports from pleural fluid - CT chest with mass at left lung base  Acute respiratory failure with hypoxia - due to #1 - continue supplemental oxygen as needed.  Goals of care - discussed extensively with patient's daughter over the phone today, we talked that if this turns out to be malignancy whether he would wish for aggressive care. She understands his poor functional status and favors no aggressive chemotherapy / XRT or surgery if this is indeed cancer.   Hyperlipidemia  History of atrial fibrillation - Currently on sotalol for rate control. Holding parameters placed - I suspect patient is not candidate for anticoagulation given frailty, fall risk, and advanced age  Constipation - Pt currently on dulcolax - added colace  Diabetes mellitus, controlled - Continue carb modified diet and sliding scale insulin   Diet: Diet heart healthy/carb modified Room service appropriate?: Yes; Fluid  consistency:: Thin Fluids: none  DVT Prophylaxis: SCD  Code Status: DNR Family Communication: d/w daughter over the phone  Disposition Plan: SNF 1 day pending am CXR   Consultants:  Pulmonology   Procedures:  Chest tube    Antibiotics  Anti-infectives    None      Studies  Dg Chest Port 1 View  05/03/2015   CLINICAL DATA:  81 year old male chest tube removal, pneumothorax after left thoracentesis. Initial encounter.  EXAM: PORTABLE CHEST 1 VIEW  COMPARISON:  05/02/2015 and earlier.  FINDINGS: Portable AP semi upright view at 0701 hours. Stable lung volumes. Continued confluent opacity at the left lung base. Left chest tube was removed yesterday. Trace left apical pneumothorax is stable. Stable cardiac size and mediastinal contours. No new pulmonary opacity.  IMPRESSION: 1. Stable trace left apical pneumothorax. 2. Stable lung volumes with confluent left lung base opacity. 3. No new cardiopulmonary abnormality.   Electronically Signed   By: Genevie Ann M.D.   On: 05/03/2015 08:16   Dg Chest Port 1 View  05/02/2015   CLINICAL DATA:  Left-sided chest tube removal.  EXAM: PORTABLE CHEST 1 VIEW  COMPARISON:  May 02, 2015.  FINDINGS: Minimal left apical pneumothorax is noted. Stable left basilar opacity is noted concerning for pneumonia or atelectasis with associated pleural effusion. Stable minimal right basilar subsegmental atelectasis or scarring is noted. Stable cardiomediastinal silhouette.  IMPRESSION: Minimal left apical pneumothorax is noted. Stable left basilar opacity is noted concerning for pneumonia or atelectasis with associated pleural effusion. Stable minimal right basilar subsegmental atelectasis or scarring is noted.   Electronically Signed   By: Marijo Conception, M.D.   On: 05/02/2015 18:42   Dg Chest [facility]  05/02/2015   CLINICAL DATA:  Status post left chest tube removal  EXAM: PORTABLE CHEST - 1 VIEW  COMPARISON:  05/02/2015  FINDINGS: Cardiac shadow is within  normal limits. Persistent left basilar infiltrate is seen. No recurrent pneumothorax is noted following chest tube removal. The right lung remains clear. No acute bony abnormality is seen.  IMPRESSION: No recurrent pneumothorax following chest tube removal.   Electronically Signed   By: Inez Catalina M.D.   On: 05/02/2015 16:03   Dg Chest Port 1 View  05/02/2015   CLINICAL DATA:  Respiratory failure.  EXAM: PORTABLE CHEST 1 VIEW  COMPARISON:  05/01/2015.  FINDINGS: Left chest tube in stable position. No pneumothorax noted on today's exam. Persistent left lower lobe infiltrate with small pleural effusion noted. Persistent cardiomegaly. No pulmonary venous congestion.  IMPRESSION: 1. Left chest tube in stable position. No pneumothorax noted on today's exam. 2. Persistent left lower lobe infiltrate and small left pleural effusion. 3. Stable cardiomegaly.   Electronically Signed   By: Marcello Moores  Register   On: 05/02/2015 07:22   Objective  Filed Vitals:   05/02/15 1630 05/02/15 2108 05/03/15 0600 05/03/15 0949  BP: 117/59 124/50 120/54 128/65  Pulse:  58 59   Temp: 97.9 F (36.6 C) 98.3 F (36.8 C) 98.3 F (36.8 C)   TempSrc: Oral Oral Oral   Resp: '18 16 18   '$ Height:      Weight:      SpO2: 95% 97% 96%     Intake/Output Summary (Last 24 hours) at 05/03/15 1132 Last data filed at 05/03/15 0805  Gross per 24 hour  Intake    480 ml  Output      0 ml  Net    480 ml   Filed Weights   04/26/15 1700 05/01/15 0500 05/02/15 0500  Weight: 87.09 kg (192 lb) 86.229 kg (190 lb 1.6 oz) 82.101 kg (181 lb)    Exam:  GENERAL: NAD  HEENT: head NCAT, no scleral icterus. Pupils round and reactive. Mucous membranes are moist.   NECK: Supple. No carotid bruits. No lymphadenopathy or thyromegaly.  LUNGS: Clear to auscultation. No wheezing or crackles  HEART: Iregular rate and rhythm without murmur. 2+ pulses, no JVD, no peripheral edema  ABDOMEN: Soft, nontender, and nondistended. Positive bowel  sounds. No hepatosplenomegaly was noted.  EXTREMITIES: Without any cyanosis, clubbing, rash, lesions or edema.  NEUROLOGIC: Alert and oriented x3. Cranial nerves II through XII are grossly intact. Strength 5/5 in all 4.   Data Reviewed: Basic Metabolic Panel:  Recent Labs Lab 04/26/15 1421 04/27/15 0343  NA 134* 134*  K 4.9 4.6  CL 99* 98*  CO2 25 30  GLUCOSE 186* 168*  BUN 16 17  CREATININE 1.09 1.06  CALCIUM 8.6* 8.8*   Liver Function Tests:  Recent Labs Lab 04/26/15 1958  PROT 6.2*   CBC:  Recent Labs Lab 04/26/15 1421 04/27/15 0343 04/27/15 2120  WBC 5.8 6.0 7.7  HGB 11.9* 11.5* 11.9*  HCT 36.2* 35.8* 36.3*  MCV 88.7 88.8 88.8  PLT 178 192 182   BNP (last 3 results)  Recent Labs  04/26/15 1551  BNP 150.1*    ProBNP (last 3 results)  Recent Labs  05/31/14 0315  PROBNP 7931.0*    CBG:  Recent Labs Lab 05/01/15 2046 05/02/15 0742 05/02/15 1129 05/02/15 1632 05/03/15 0759  GLUCAP 197* 198* 231* 227* 253*    Recent Results (from the past 240 hour(s))  AFB culture with smear  Status: None (Preliminary result)   Collection Time: 04/26/15  7:13 PM  Result Value Ref Range Status   Specimen Description FLUID PLEURAL  Final   Special Requests NONE  Final   Acid Fast Smear   Final    NO ACID FAST BACILLI SEEN Performed at [facility]    Culture   Final    CULTURE WILL BE EXAMINED FOR 6 WEEKS BEFORE ISSUING A FINAL REPORT Performed at [facility]    Report Status PENDING  Incomplete  Body fluid culture     Status: None   Collection Time: 04/26/15  7:13 PM  Result Value Ref Range Status   Specimen Description FLUID PLEURAL  Final   Special Requests NONE  Final   Gram Stain   Final    ABUNDANT WBC PRESENT, PREDOMINANTLY MONONUCLEAR NO ORGANISMS SEEN    Culture NO GROWTH 3 DAYS  Final   Report Status 04/30/2015 FINAL  Final  Fungal stain     Status: None   Collection Time: 04/26/15  7:13 PM  Result Value Ref  Range Status   Specimen Description FLUID PLEURAL  Final   Special Requests NONE  Final   Fungal Smear   Final    NO YEAST OR FUNGAL ELEMENTS SEEN Performed at [facility]    Report Status 04/27/2015 FINAL  Final  MRSA PCR Screening     Status: None   Collection Time: 04/27/15  9:26 PM  Result Value Ref Range Status   MRSA by PCR NEGATIVE NEGATIVE Final    Comment:        The GeneXpert MRSA Assay (FDA approved for NASAL specimens only), is one component of a comprehensive MRSA colonization surveillance program. It is not intended to diagnose MRSA infection nor to guide or monitor treatment for MRSA infections.      Scheduled Meds: . docusate sodium  100 mg Oral BID  . escitalopram  10 mg Oral Daily  . finasteride  5 mg Oral Daily  . fluticasone  1 spray Each Nare Daily  . insulin aspart  0-9 Units Subcutaneous TID WC  . senna  1 tablet Oral BID  . sodium chloride  3 mL Intravenous Q12H  . sotalol  80 mg Oral BID  . traMADol  50 mg Oral q morning - 10a   Continuous Infusions:    Marzetta Board, MD [facility] Pager [phone]. If 7 PM - 7 AM, please contact night-coverage at [email or web address], password [facility] 05/03/2015, 11:32 AM  LOS: 7 days

## 2015-05-04 ENCOUNTER — Inpatient Hospital Stay (HOSPITAL_COMMUNITY): Payer: Medicare Other

## 2015-05-04 ENCOUNTER — Telehealth: Payer: Self-pay | Admitting: Pulmonary Disease

## 2015-05-04 DIAGNOSIS — C7802 Secondary malignant neoplasm of left lung: Secondary | ICD-10-CM

## 2015-05-04 DIAGNOSIS — C801 Malignant (primary) neoplasm, unspecified: Secondary | ICD-10-CM

## 2015-05-04 DIAGNOSIS — J910 Malignant pleural effusion: Secondary | ICD-10-CM

## 2015-05-04 DIAGNOSIS — E118 Type 2 diabetes mellitus with unspecified complications: Secondary | ICD-10-CM

## 2015-05-04 DIAGNOSIS — C7800 Secondary malignant neoplasm of unspecified lung: Secondary | ICD-10-CM | POA: Diagnosis present

## 2015-05-04 LAB — GLUCOSE, CAPILLARY
GLUCOSE-CAPILLARY: 132 mg/dL — AB (ref 65–99)
Glucose-Capillary: 216 mg/dL — ABNORMAL HIGH (ref 65–99)
Glucose-Capillary: 209 mg/dL — ABNORMAL HIGH (ref 65–99)

## 2015-05-04 MED ORDER — OXYCODONE HCL 5 MG PO TABS: 5.0000 mg | ORAL_TABLET | Freq: Four times a day (QID) | ORAL | Status: AC | PRN

## 2015-05-04 MED ORDER — TRAMADOL HCL 50 MG PO TABS: ORAL_TABLET | ORAL | Status: AC

## 2015-05-04 MED ORDER — LORAZEPAM 0.5 MG PO TABS: 0.5000 mg | ORAL_TABLET | Freq: Two times a day (BID) | ORAL | Status: AC

## 2015-05-04 NOTE — Telephone Encounter (Signed)
Called and spoke with pt's daughter. She wanted to inform Dr. Ashok Cordia that she would be in her father's room at 1045. I informed her that I would contact JN to let him know of her arrival time. JN was paged with message of her arrival at 10:48am. Nothing further needed.

## 2015-05-04 NOTE — Care Management Important Message (Signed)
Important Message  Patient Details  Name: [name] [name] MRN: [id number] Date of Birth: [birth year]   Medicare Important Message Given:  Yes-third notification given    Nathen May 05/04/2015, 11:20 AM

## 2015-05-04 NOTE — Consult Note (Addendum)
WOC wound follow up Wound type: Pt had previous MASD to bilat buttocks; refer to progress note on 10/3.  This has resolved and skin is dry and intact without open areas except small partial thickness skin loss remaining near gluteal fold; .2X.2X.1cm, pink and moist. Pt denies further discomfort related to skin. Continue present plan of care with barrier cream to protect skin and repel moisture. Please re-consult if further assistance is needed.  Thank-you,  Julien Girt MSN, [place], [place], [place], [place]

## 2015-05-04 NOTE — Progress Notes (Signed)
PCCM Family Discussion Note:  I had a lengthy discussion with the patient and his daughter at bedside as well as with Dr. Letta Median. 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 His case was discussed at our Multidisciplinary Thoracic Conference this morning. Given the patient's advanced age and physical disabilities along with the advanced stage of his lung cancer he is not felt to be a good candidate for chemotherapy. We discussed having the patient speak with medical oncology, but at this time, he only desires to proceed with Hospice and Palliative Care and return to his skilled nursing facility. I explained that his cancer will likely progress and his fluid will likely reaccumulate but at what speed we do not know. I gave his daughter my business card and offered my condolences. He is planned for discharge today.  Sonia Baller Ashok Cordia, M.D. [place] [facility] Pager:  [phone] After 3pm or if no response, call [phone]

## 2015-05-04 NOTE — Progress Notes (Signed)
Report called to [place] from [facility], IV and telemetry box removed and box placed at the nurse station. Pt groomed and ready to be transported to [place] [facility]. Daughter at bedside notified.  Ferdinand Lango, RN

## 2015-05-04 NOTE — Discharge Summary (Signed)
Physician Discharge Summary  [name] [name] WUJ:[id number] DOB: [birth year] DOA: 04/26/2015  PCP: Mathews Argyle, MD  Admit date: 04/26/2015 Discharge date: 05/04/2015  Time spent: > 35 minutes  Recommendations for Outpatient Follow-up:  1. Follow up with Dr. Felipa Eth in 1-2 weeks 2. Recommend palliative consult at SNF  Discharge Diagnoses:  Principal Problem:   Pleural effusion on left Active Problems:   Hyperlipidemia   History of atrial fibrillation   Diabetes mellitus, controlled ([place])   Acute respiratory failure with hypoxia ([facility])   Pleural effusion, left   Pressure ulcer   Pneumothorax   Goals of care, counseling/discussion   Small cell carcinoma ([place])   Metastatic lung carcinoma ([place])  Discharge Condition: stable  Diet recommendation: regular  Filed Weights   05/01/15 0500 05/02/15 0500 05/04/15 0551  Weight: 86.229 kg (190 lb 1.6 oz) 82.101 kg (181 lb) 82.101 kg (181 lb)   History of present illness:  [name] [name] is a 81 y.o. male with a Past Medical History of atrial fibrillation on anticoagulation, history of pulmonary fibrosis, AAA, hypertension, diabetes who presents today with the above noted complaint. Patient currently is living in a skilled nursing facility for the past 10 months. Per family/patient-for the past 2 days he's had intermittent episodes of shortness of breath.Dyspnea is at both at rest and exertion. No fever or chest pain. No cough. A x-ray done as an outpatient showed pleural effusion, plans for thoracocentesis as an outpatient-however his shortness of breath worsened and was sent to the emergency room for further evaluation and treatment. A chest x-ray in the emergency room showed a large left pleural effusion with complete left hemithorax opacification. I was subsequently asked to admit this patient for further evaluation and treatment.  Hospital Course:  Malignant pleural effusion on left in the setting of stage IV lung cancer with  pathology showing small cell carcinoma - Pulmonology following, s/p chest tube, removed 10/4 with stable respiratory status since. CT chest showed a mass at left lung base which was suspicious for cancer. Cytology from pleural fluid unfortunately confirmed malignancy which by definition is now stage IV. After discussing with patient and his daughter, patient will not pursue an aggressive approach and will recommend palliative care consult at SNF with focus on symptom management and with transition to hospice when appropriate.  Acute respiratory failure with hypoxia - due to #1, supportive treatment, if fluid re accumulates will benefit from palliative Pleurex placement.  Goals of care - Myself, Dr. Ashok Cordia from Pulmonology and patient and his daughter discussed about the diagnosis and the fact that he is not a good candidate for chemotherapy given poor functional status, and this apparently has also been discussed this morning at tumor board per Dr. Ashok Cordia. Patient wants to go back to his SNF today, and focus on symptom management mostly. If they decide to meet and further discuss with an oncologist in the future, I have given the office information for Dr. Julien Nordmann. Hyperlipidemia History of atrial fibrillation - Currently on sotalol for rate control, patient is not candidate for anticoagulation given frailty, fall risk, and advanced age Diabetes mellitus, controlled - Continue home regimen  Procedures:  Chest tube   Consultations:  Pulmonology   Discharge Exam: Filed Vitals:   05/03/15 0949 05/03/15 1330 05/03/15 2027 05/04/15 0551  BP: 128/65 123/57 113/58 127/58  Pulse:  53 55 55  Temp:  98.3 F (36.8 C) 98 F (36.7 C) 98.1 F (36.7 C)  TempSrc:  Oral Oral Oral  Resp:  $'18 18 18  'q$ Height:      Weight:    82.101 kg (181 lb)  SpO2:  90%  93%    General: NAD Cardiovascular: RRR Respiratory: CTA biL  Discharge Instructions     Medication List    STOP taking these medications         rosuvastatin 10 MG tablet  Commonly known as:  CRESTOR      TAKE these medications        amoxicillin 500 MG capsule  Commonly known as:  AMOXIL  Take 2,000 mg by mouth daily as needed (one hour before dental appointment).     atorvastatin 40 MG tablet  Commonly known as:  LIPITOR  Take 40 mg by mouth at bedtime.     escitalopram 10 MG tablet  Commonly known as:  LEXAPRO  Take 10 mg by mouth daily.     EYE VITAMINS PO  Take 1 tablet by mouth daily. SMOTHERS FORMULA     finasteride 5 MG tablet  Commonly known as:  PROSCAR  Take 5 mg by mouth daily.     Fish Oil 1200 MG Caps  Take 1,200 mg by mouth 2 (two) times daily.     glipiZIDE 10 MG 24 hr tablet  Commonly known as:  GLUCOTROL XL  Take 10 mg by mouth daily with breakfast.     GLUCOSAMINE 1500 COMPLEX PO  Take 1,500 mg by mouth 2 (two) times daily.     hydroxypropyl methylcellulose / hypromellose 2.5 % ophthalmic solution  Commonly known as:  ISOPTO TEARS / GONIOVISC  Place 1 drop into both eyes 4 (four) times daily.     loperamide 2 MG capsule  Commonly known as:  IMODIUM  Take 2 mg by mouth as needed for diarrhea or loose stools.     LORazepam 0.5 MG tablet  Commonly known as:  ATIVAN  Take 1 tablet (0.5 mg total) by mouth 2 (two) times daily.     LUBRICANT EYE DROPS 0.5 % Soln  Generic drug:  carboxymethylcellulose  Place 2 drops into both eyes as needed (dry eye).     LUBRICANT EYE Oint  Place 1 application into both eyes at bedtime.     oxyCODONE 5 MG immediate release tablet  Commonly known as:  Oxy IR/ROXICODONE  Take 1 tablet (5 mg total) by mouth every 6 (six) hours as needed for breakthrough pain.     polyethylene glycol packet  Commonly known as:  MIRALAX / GLYCOLAX  Take 17 g by mouth every other day.     senna 8.6 MG Tabs tablet  Commonly known as:  SENOKOT  Take 1 tablet (8.6 mg total) by mouth 2 (two) times daily.     sotalol 80 MG tablet  Commonly known as:  BETAPACE  Take 80  mg by mouth 2 (two) times daily.     tamsulosin 0.4 MG Caps capsule  Commonly known as:  FLOMAX  Take 0.4 mg by mouth daily after supper.     traMADol 50 MG tablet  Commonly known as:  ULTRAM  Take one tablet by mouth every morning; Take one tablet by mouth every night at bedtime as needed for pain     VITAMIN B COMPLEX PO  Take 1 tablet by mouth daily.           Follow-up Information    Follow up with Mathews Argyle, MD. Schedule an appointment as soon as possible for a visit in 2 weeks.  Specialty:  Internal Medicine   Contact information:   [address] [place] [address] [phone]       Follow up with Eilleen Kempf., MD.   Specialty:  Oncology   Why:  As needed   Contact information:   [address] [place] [address] [phone]       The results of significant diagnostics from this hospitalization (including imaging, microbiology, ancillary and laboratory) are listed below for reference.    Significant Diagnostic Studies: Dg Chest 2 View  05/04/2015   CLINICAL DATA:  Sob and soreness in chest today  ,ptx  EXAM: CHEST  2 VIEW  COMPARISON:  05/03/2015  FINDINGS: Heart size is enlarged. There is increased left lung opacity, confluent at the left base. Patchy density also seen within the right lung base. Bilateral pleural effusions, left greater than right. Trace left apical pneumothorax appear stable.  IMPRESSION: 1. Stable left pneumothorax. 2. Increased left lung opacity consistent with infiltrate.   Electronically Signed   By: Nolon Nations M.D.   On: 05/04/2015 10:24   Dg Chest 2 View  04/26/2015   CLINICAL DATA:  Shortness of breath, onset 3-4 days ago.  EXAM: CHEST  2 VIEW  COMPARISON:  Chest x-ray 06/03/2014  FINDINGS: There is a large left pleural effusion with near complete opacification of the left hemithorax. Probable underlying COPD. Scarring noted in the right base. There is cardiomegaly. Mitral valve annular  calcifications noted. No acute bony abnormality.  IMPRESSION: Large left pleural effusion with near complete opacification of the left hemithorax.  COPD.  Right basilar scarring.  Cardiomegaly.   Electronically Signed   By: Rolm Baptise M.D.   On: 04/26/2015 15:40   Ct Chest Wo Contrast  04/28/2015   CLINICAL DATA:  81 year old male with pleural effusion status post thoracentesis. LEFT chest tube placed.  EXAM: CT CHEST WITHOUT CONTRAST  TECHNIQUE: Multidetector CT imaging of the chest was performed following the standard protocol without IV contrast.  COMPARISON:  Radiograph 04/27/2015  FINDINGS: Mediastinum/Nodes: No axillary or supraclavicular lymphadenopathy. No mediastinal hilar adenopathy. No pericardial fluid. Mild thickening of the esophagus. Lobular calcification coronary artery calcification.  Lungs/Pleura: LEFT chest tube in place with tiny anterior pneumothorax. Minimal pleural fluid within the LEFT pleural space. There is consolidation within the RIGHT lower lobe and lingula consists with pneumonia. There is a rounded lesion in the LEFT lower lobe anteriorly measuring 34 by 27 mm (image 45, series 2 there is mild reticulation peripherally in the RIGHT lung.  Upper abdomen: Limited view of the liver, kidneys, pancreas are unremarkable. Normal adrenal glands. No aggressive osseous lesion.  Musculoskeletal: No aggressive osseous lesion.  IMPRESSION: 1. Very small anterior LEFT pneumothorax with chest tube at LEFT lung base. 2. Nodular consolidation within the lingula and the LEFT lower lobe consistent with pneumonia versus atelectasis. 3. Rounded mass lesion at the LEFT lung base with differential including rounded pneumonia/atelectasis versus neoplasm. Recommend correlation with pleural fluid cytology.   Electronically Signed   By: Suzy Bouchard M.D.   On: 04/28/2015 11:05   Dg Chest Port 1 View  05/03/2015   CLINICAL DATA:  81 year old male chest tube removal, pneumothorax after left  thoracentesis. Initial encounter.  EXAM: PORTABLE CHEST 1 VIEW  COMPARISON:  05/02/2015 and earlier.  FINDINGS: Portable AP semi upright view at 0701 hours. Stable lung volumes. Continued confluent opacity at the left lung base. Left chest tube was removed yesterday. Trace left apical pneumothorax is stable. Stable  cardiac size and mediastinal contours. No new pulmonary opacity.  IMPRESSION: 1. Stable trace left apical pneumothorax. 2. Stable lung volumes with confluent left lung base opacity. 3. No new cardiopulmonary abnormality.   Electronically Signed   By: Genevie Ann M.D.   On: 05/03/2015 08:16   Dg Chest Port 1 View  05/02/2015   CLINICAL DATA:  Left-sided chest tube removal.  EXAM: PORTABLE CHEST 1 VIEW  COMPARISON:  May 02, 2015.  FINDINGS: Minimal left apical pneumothorax is noted. Stable left basilar opacity is noted concerning for pneumonia or atelectasis with associated pleural effusion. Stable minimal right basilar subsegmental atelectasis or scarring is noted. Stable cardiomediastinal silhouette.  IMPRESSION: Minimal left apical pneumothorax is noted. Stable left basilar opacity is noted concerning for pneumonia or atelectasis with associated pleural effusion. Stable minimal right basilar subsegmental atelectasis or scarring is noted.   Electronically Signed   By: Marijo Conception, M.D.   On: 05/02/2015 18:42   Dg Chest Port 1 View  05/02/2015   CLINICAL DATA:  Status post left chest tube removal  EXAM: PORTABLE CHEST - 1 VIEW  COMPARISON:  05/02/2015  FINDINGS: Cardiac shadow is within normal limits. Persistent left basilar infiltrate is seen. No recurrent pneumothorax is noted following chest tube removal. The right lung remains clear. No acute bony abnormality is seen.  IMPRESSION: No recurrent pneumothorax following chest tube removal.   Electronically Signed   By: Inez Catalina M.D.   On: 05/02/2015 16:03   Dg Chest Port 1 View  05/02/2015   CLINICAL DATA:  Respiratory failure.  EXAM:  PORTABLE CHEST 1 VIEW  COMPARISON:  05/01/2015.  FINDINGS: Left chest tube in stable position. No pneumothorax noted on today's exam. Persistent left lower lobe infiltrate with small pleural effusion noted. Persistent cardiomegaly. No pulmonary venous congestion.  IMPRESSION: 1. Left chest tube in stable position. No pneumothorax noted on today's exam. 2. Persistent left lower lobe infiltrate and small left pleural effusion. 3. Stable cardiomegaly.   Electronically Signed   By: Marcello Moores  Register   On: 05/02/2015 07:22   Dg Chest Port 1 View  05/01/2015   CLINICAL DATA:  Follow-up pleural effusion  EXAM: PORTABLE CHEST 1 VIEW  COMPARISON:  Portable chest x-ray of April 30, 2015  FINDINGS: The lungs are adequately inflated. The interstitial markings remain increased diffusely. The faint pleural line remains visible between the lateral aspects of the left second and third ribs. The small caliber chest tube is present at the left lung base laterally. There is no pneumothorax nor large pleural effusion. The left hemidiaphragm remains obscured. The cardiac silhouette remains enlarged. The pulmonary vascularity is not clearly engorged. The bony thorax is unremarkable where visualized.  IMPRESSION: Stable appearance of the chest since yesterday's study. Trace left apical pneumothorax. There remainspleural fluid layering posteriorly associated with left basilar atelectasis or pneumonia. Stable cardiomegaly and bilaterally increased interstitial markings.   Electronically Signed   By: David  Martinique M.D.   On: 05/01/2015 07:36   Dg Chest Port 1 View  04/30/2015   CLINICAL DATA:  Pleural effusion  EXAM: PORTABLE CHEST 1 VIEW  COMPARISON:  04/27/2015  FINDINGS: Pigtail pleural catheter on the left is unchanged. Minimal left pneumothorax unchanged. Left effusion and left lower lobe infiltrate unchanged.  COPD. Scarring in the right lung base unchanged. Cardiac enlargement without heart failure.  IMPRESSION: Minimal left  pneumothorax is unchanged. Left lower lobe infiltrate and effusion also unchanged. No new findings since the prior chest x-ray.  Electronically Signed   By: Franchot Gallo M.D.   On: 04/30/2015 07:02   Dg Chest Port 1 View  04/27/2015   CLINICAL DATA:  Pleural effusion, chest tube in place.  EXAM: PORTABLE CHEST 1 VIEW  COMPARISON:  Earlier this day at 1230 hour  FINDINGS: Pigtail catheter in the lateral left hemithorax with continued decrease in left pleural effusion, with small to moderate volume of pleural fluid remaining. There is a questionable tiny left pneumothorax versus artifact in the upper lateral hemithorax. Adjacent atelectasis or consolidation in the left mid and lower lung zone. Probable peripheral fibrosis in the right lung, unchanged from prior exam. There is atherosclerosis of the thoracic aorta.  IMPRESSION: Decreased size of left pleural effusion with small to moderate volume of persistent pleural fluid. Question of tiny left pneumothorax versus artifact, left pigtail catheter in place. Patchy opacity in the left mid and lower lung zone, atelectasis versus pneumonia.   Electronically Signed   By: Jeb Levering M.D.   On: 04/27/2015 20:45   Dg Chest Port 1 View  04/27/2015   CLINICAL DATA:  81 year old male with pleural effusion.  EXAM: PORTABLE CHEST 1 VIEW  COMPARISON:  Chest x-ray 04/27/2015.  FINDINGS: New pigtail drainage catheter in lower aspect of the left hemithorax. There continues to be a large left-sided pleural effusion. No definite pneumothorax component. Minimal aerated lung in the apex of the left upper lobe. The remainder of the left lung is either completely collapsed or consolidated. Coarse interstitial markings are again noted throughout the right lung, particularly at the right lung base, likely related to chronic interstitial lung disease. No acute consolidative disease in the right lung. No right pleural effusion. No evidence of pulmonary edema. Cardiac silhouette  and mediastinal contours are largely obscured by in the opacified left hemithorax. Extensive atherosclerotic calcifications in the thoracic aorta. The  IMPRESSION: 1. Status post left-sided chest tube placement with slight decrease in large left pleural effusion. Extensive atelectasis and/or consolidation throughout the left lung. 2. Coarse interstitial markings in the right lung suggesting underlying interstitial lung disease. 3. Atherosclerosis.   Electronically Signed   By: Vinnie Langton M.D.   On: 04/27/2015 12:51   Dg Chest Portable 1 View  04/27/2015   CLINICAL DATA:  Shortness of breath, pleural effusion, left chest pain  EXAM: PORTABLE CHEST 1 VIEW  COMPARISON:  04/18/2015  FINDINGS: Near complete opacification of the left hemithorax, likely reflecting increasing large left pleural effusion.  Mild subpleural reticulation/fibrosis at the right lung base.  No pneumothorax is seen.  The heart is normal in size but partially obscured.  IMPRESSION: New complete opacification of the left hemithorax, likely reflecting increasing large left pleural effusion.   Electronically Signed   By: Julian Hy M.D.   On: 04/27/2015 10:53   Dg Chest Port 1 View  04/26/2015   CLINICAL DATA:  Left pleural effusion, post thoracentesis. Rule out pneumothorax.  EXAM: PORTABLE CHEST 1 VIEW  COMPARISON:  Earlier this day at 1530 hour  FINDINGS: Portable exam at 1946 hour: Large left pleural effusion, however slightly decreased in size from prior exam with decreased apical component. Small amount of aerated lung is seen in the left upper hemithorax. No definite pneumothorax. Mild chronic change in the right lung without right pleural effusion. Cardiomediastinal contours are unchanged, partially obscured.  IMPRESSION: No pneumothorax. Persistent large left pleural effusion, slightly decreased in size post thoracentesis.   Electronically Signed   By: Jeb Levering M.D.   On:  04/26/2015 20:11    Microbiology: Recent  Results (from the past 240 hour(s))  AFB culture with smear     Status: None (Preliminary result)   Collection Time: 04/26/15  7:13 PM  Result Value Ref Range Status   Specimen Description FLUID PLEURAL  Final   Special Requests NONE  Final   Acid Fast Smear   Final    NO ACID FAST BACILLI SEEN Performed at [facility]    Culture   Final    CULTURE WILL BE EXAMINED FOR 6 WEEKS BEFORE ISSUING A FINAL REPORT Performed at [facility]    Report Status PENDING  Incomplete  Body fluid culture     Status: None   Collection Time: 04/26/15  7:13 PM  Result Value Ref Range Status   Specimen Description FLUID PLEURAL  Final   Special Requests NONE  Final   Gram Stain   Final    ABUNDANT WBC PRESENT, PREDOMINANTLY MONONUCLEAR NO ORGANISMS SEEN    Culture NO GROWTH 3 DAYS  Final   Report Status 04/30/2015 FINAL  Final  Fungal stain     Status: None   Collection Time: 04/26/15  7:13 PM  Result Value Ref Range Status   Specimen Description FLUID PLEURAL  Final   Special Requests NONE  Final   Fungal Smear   Final    NO YEAST OR FUNGAL ELEMENTS SEEN Performed at [facility]    Report Status 04/27/2015 FINAL  Final  MRSA PCR Screening     Status: None   Collection Time: 04/27/15  9:26 PM  Result Value Ref Range Status   MRSA by PCR NEGATIVE NEGATIVE Final    Comment:        The GeneXpert MRSA Assay (FDA approved for NASAL specimens only), is one component of a comprehensive MRSA colonization surveillance program. It is not intended to diagnose MRSA infection nor to guide or monitor treatment for MRSA infections.      CBC:  Recent Labs Lab 04/27/15 2120  WBC 7.7  HGB 11.9*  HCT 36.3*  MCV 88.8  PLT 182   BNP: BNP (last 3 results)  Recent Labs  04/26/15 1551  BNP 150.1*    ProBNP (last 3 results)  Recent Labs  05/31/14 0315  PROBNP 7931.0*    CBG:  Recent Labs Lab 05/03/15 0759 05/03/15 1120 05/03/15 1632 05/03/15 2108  05/04/15 0728  GLUCAP 253* 321* 119* 206* 216*       Signed:  Marzetta Board  [facility] 05/04/2015, 11:24 AM

## 2015-05-11 ENCOUNTER — Telehealth: Payer: Self-pay | Admitting: Pulmonary Disease

## 2015-05-11 NOTE — Telephone Encounter (Signed)
So, who do they need to contact to get this information?  I have no clue how to handle this call.

## 2015-05-11 NOTE — Telephone Encounter (Signed)
[name] [name] from [place] calling to obtain procedure code for biopsy Attempted to call Verdis Frederickson back, was on hold for 20 min.  Hung up.  W[facility]  [facility] - do you have a procedure code for this Biopsy?  Where do you look to get that information?

## 2015-05-11 NOTE — Telephone Encounter (Signed)
Not sure what he is having done i think he is an in pt Joellen [place]

## 2015-05-12 NOTE — Telephone Encounter (Signed)
i don't even see anything scheduled do you? [name] [name]

## 2015-05-12 NOTE — Telephone Encounter (Signed)
Noted [name] [name]

## 2015-05-12 NOTE — Telephone Encounter (Signed)
Called [facility] and spoke with [place]. [name] [name] per pt's chart he was admitted at the time of 04/26/15 and I did not see a biopsy that was performed, however, the pt had a left chest tube and cytology was ran on the pt's pleural fluid. Marya Amsler stated he would get this information to Verdis Frederickson and Verdis Frederickson should call back.   Will forward to [facility] as [place].

## 2015-05-17 ENCOUNTER — Other Ambulatory Visit: Payer: Self-pay | Admitting: Radiology

## 2015-05-17 ENCOUNTER — Other Ambulatory Visit (HOSPITAL_COMMUNITY): Payer: Self-pay | Admitting: Obstetrics and Gynecology

## 2015-05-17 DIAGNOSIS — J90 Pleural effusion, not elsewhere classified: Secondary | ICD-10-CM

## 2015-05-19 ENCOUNTER — Ambulatory Visit (HOSPITAL_COMMUNITY)
Admission: RE | Admit: 2015-05-19 | Discharge: 2015-05-19 | Disposition: A | Discharge status: Home / Self Care | Source: Ambulatory Visit | Attending: Obstetrics and Gynecology | Admitting: Obstetrics and Gynecology

## 2015-05-19 ENCOUNTER — Encounter (HOSPITAL_COMMUNITY): Payer: Self-pay

## 2015-05-19 DIAGNOSIS — J90 Pleural effusion, not elsewhere classified: Secondary | ICD-10-CM

## 2015-05-19 DIAGNOSIS — E1140 Type 2 diabetes mellitus with diabetic neuropathy, unspecified: Secondary | ICD-10-CM | POA: Diagnosis not present

## 2015-05-19 DIAGNOSIS — I4510 Unspecified right bundle-branch block: Secondary | ICD-10-CM | POA: Diagnosis not present

## 2015-05-19 DIAGNOSIS — Z87891 Personal history of nicotine dependence: Secondary | ICD-10-CM | POA: Diagnosis not present

## 2015-05-19 DIAGNOSIS — I4891 Unspecified atrial fibrillation: Secondary | ICD-10-CM | POA: Insufficient documentation

## 2015-05-19 DIAGNOSIS — M1990 Unspecified osteoarthritis, unspecified site: Secondary | ICD-10-CM | POA: Insufficient documentation

## 2015-05-19 DIAGNOSIS — N400 Enlarged prostate without lower urinary tract symptoms: Secondary | ICD-10-CM | POA: Insufficient documentation

## 2015-05-19 DIAGNOSIS — Z515 Encounter for palliative care: Secondary | ICD-10-CM | POA: Diagnosis not present

## 2015-05-19 DIAGNOSIS — E7800 Pure hypercholesterolemia, unspecified: Secondary | ICD-10-CM | POA: Insufficient documentation

## 2015-05-19 DIAGNOSIS — Z7984 Long term (current) use of oral hypoglycemic drugs: Secondary | ICD-10-CM | POA: Diagnosis not present

## 2015-05-19 DIAGNOSIS — K449 Diaphragmatic hernia without obstruction or gangrene: Secondary | ICD-10-CM | POA: Diagnosis not present

## 2015-05-19 DIAGNOSIS — I714 Abdominal aortic aneurysm, without rupture: Secondary | ICD-10-CM | POA: Insufficient documentation

## 2015-05-19 DIAGNOSIS — H3530 Unspecified macular degeneration: Secondary | ICD-10-CM | POA: Insufficient documentation

## 2015-05-19 DIAGNOSIS — J910 Malignant pleural effusion: Secondary | ICD-10-CM | POA: Diagnosis not present

## 2015-05-19 DIAGNOSIS — C3490 Malignant neoplasm of unspecified part of unspecified bronchus or lung: Secondary | ICD-10-CM | POA: Diagnosis not present

## 2015-05-19 DIAGNOSIS — J449 Chronic obstructive pulmonary disease, unspecified: Secondary | ICD-10-CM | POA: Diagnosis not present

## 2015-05-19 DIAGNOSIS — J8410 Pulmonary fibrosis, unspecified: Secondary | ICD-10-CM | POA: Diagnosis not present

## 2015-05-19 DIAGNOSIS — Z7982 Long term (current) use of aspirin: Secondary | ICD-10-CM | POA: Insufficient documentation

## 2015-05-19 LAB — CBC
HEMATOCRIT: 32.9 % — AB (ref 39.0–52.0)
Hemoglobin: 10.4 g/dL — ABNORMAL LOW (ref 13.0–17.0)
MCH: 28 pg (ref 26.0–34.0)
MCHC: 31.6 g/dL (ref 30.0–36.0)
MCV: 88.4 fL (ref 78.0–100.0)
PLATELETS: 232 10*3/uL (ref 150–400)
RBC: 3.72 MIL/uL — ABNORMAL LOW (ref 4.22–5.81)
RDW: 14.3 % (ref 11.5–15.5)
WBC: 5.2 10*3/uL (ref 4.0–10.5)

## 2015-05-19 LAB — APTT: APTT: 34 s (ref 24–37)

## 2015-05-19 LAB — BASIC METABOLIC PANEL
Anion gap: 9 (ref 5–15)
BUN: 10 mg/dL (ref 6–20)
CALCIUM: 8.7 mg/dL — AB (ref 8.9–10.3)
CO2: 28 mmol/L (ref 22–32)
Chloride: 96 mmol/L — ABNORMAL LOW (ref 101–111)
Creatinine, Ser: 0.92 mg/dL (ref 0.61–1.24)
GFR calc Af Amer: >60 mL/min (ref >60)
GLUCOSE: 185 mg/dL — AB (ref 65–99)
Potassium: 5.2 mmol/L — ABNORMAL HIGH (ref 3.5–5.1)
SODIUM: 133 mmol/L — AB (ref 135–145)

## 2015-05-19 LAB — PROTIME-INR
INR: 1.09 (ref 0.00–1.49)
Prothrombin Time: 14.3 seconds (ref 11.6–15.2)

## 2015-05-19 MED ORDER — LIDOCAINE HCL 1 % IJ SOLN
INTRAMUSCULAR | Status: AC
  Filled 2015-05-19: qty 20

## 2015-05-19 MED ORDER — CEFAZOLIN SODIUM-DEXTROSE 2-3 GM-% IV SOLR
INTRAVENOUS | Status: AC
  Filled 2015-05-19: qty 50

## 2015-05-19 MED ORDER — FENTANYL CITRATE (PF) 100 MCG/2ML IJ SOLN
INTRAMUSCULAR | Status: AC
  Filled 2015-05-19: qty 2

## 2015-05-19 MED ORDER — MIDAZOLAM HCL 2 MG/2ML IJ SOLN
INTRAMUSCULAR | Status: AC
  Filled 2015-05-19: qty 2

## 2015-05-19 MED ORDER — SODIUM CHLORIDE 0.9 % IV SOLN
Freq: Once | INTRAVENOUS | Status: AC
  Administered 2015-05-19: 08:00:00 via INTRAVENOUS

## 2015-05-19 MED ORDER — CEFAZOLIN SODIUM-DEXTROSE 2-3 GM-% IV SOLR: 2.0000 g | INTRAVENOUS | Status: DC

## 2015-05-19 NOTE — Sedation Documentation (Signed)
Dr Annamaria Boots spoke to family and patient to explain why procedure cancelled

## 2015-05-19 NOTE — Sedation Documentation (Signed)
In nurses station, family present. Having crackers and juice. Waiting transportation back to [facility]. Report given to [employer].

## 2015-05-19 NOTE — Sedation Documentation (Signed)
Procedure cancelled by Dr Annamaria Boots as not needed at this time

## 2015-05-19 NOTE — H&P (Signed)
Chief Complaint: Patient was seen in consultation today for recurrent malignant Left pleural effusion---need for pleurX catheter placement at the request of Tera Partridge  Referring Physician(s): Dr Tera Partridge  History of Present Illness: [name] [name] is a 81 y.o. male   Pt with Hx recurrent malignant L pleural effusion +small cell lung cancer Has had thoracentesis 2x in Sept and chest tube drain placed with PCCM Removed 05/02/2015 Recurrence again and now with Hospice care Request made for Left tunneled PleurX catheter placement  Past Medical History  Diagnosis Date  . Atrial fibrillation ([place])   . Gynecomastia   . RBBB   . High cholesterol   . Hiatal hernia   . AAA (abdominal aortic aneurysm) ([place])   . Heart murmur   . Neuropathic impotence   . Neuropathy ([place])   . Macular degeneration, wet ([place])   . Pulmonary fibrosis ([place])   . Liver disease   . Left displaced femoral neck fracture ([place]) 05/30/2014  . Elevated troponin   . Pleural effusion   . Shortness of breath dyspnea   . Diabetes ([place])     type 2  . Prostate enlargement   . Arthritis   . Aortic aneurysm [facility])     Past Surgical History  Procedure Laterality Date  . Colostomy  1960  . Shoulder surgery Right   . Nose surgery      multiple times--broken nose/lacerations  . Hip arthroplasty Left 06/01/2014    Procedure: ARTHROPLASTY BIPOLAR HIP;  Surgeon: Johnny Bridge, MD;  Location: [facility];  Service: Orthopedics;  Laterality: Left;  . Colostomy reversal      Allergies: Review of patient's allergies indicates no known allergies.  Medications: Prior to Admission medications   Medication Sig Start Date End Date Taking? Authorizing Provider  acetaminophen (TYLENOL) 500 MG tablet Take 1,000 mg by mouth every 6 (six) hours as needed for mild pain or moderate pain.   Yes Historical Provider, MD  amoxicillin (AMOXIL) 500 MG capsule Take 2,000 mg by mouth daily as needed (one hour before dental  appointment).   Yes Historical Provider, MD  Artificial Tear Ointment (LUBRICANT EYE) OINT Place 1 application into both eyes at bedtime.   Yes Historical Provider, MD  aspirin 325 MG tablet Take 325 mg by mouth daily.   Yes Historical Provider, MD  atorvastatin (LIPITOR) 40 MG tablet Take 40 mg by mouth at bedtime.   Yes Historical Provider, MD  B Complex Vitamins (VITAMIN B COMPLEX PO) Take 1 tablet by mouth daily.   Yes Historical Provider, MD  Calcium-Vitamin D-Vitamin K (VIACTIV) [phone] MG-UNT-MCG CHEW Chew 1 tablet by mouth 2 (two) times daily.   Yes Historical Provider, MD  carboxymethylcellulose (LUBRICANT EYE DROPS) 0.5 % SOLN Place 2 drops into both eyes as needed (dry eye).   Yes Historical Provider, MD  docusate sodium (COLACE) 100 MG capsule Take 100 mg by mouth daily.   Yes Historical Provider, MD  escitalopram (LEXAPRO) 10 MG tablet Take 10 mg by mouth daily.   Yes Historical Provider, MD  famotidine (PEPCID) 20 MG tablet Take 20 mg by mouth daily.   Yes Historical Provider, MD  finasteride (PROSCAR) 5 MG tablet Take 5 mg by mouth daily.   Yes Historical Provider, MD  fluticasone (FLONASE) 50 MCG/ACT nasal spray Place 1 spray into both nostrils daily.   Yes Historical Provider, MD  glipiZIDE (GLUCOTROL XL) 10 MG 24 hr tablet Take 10 mg by mouth daily with breakfast.   Yes  Historical Provider, MD  Glucosamine-Chondroit-Vit C-Mn (GLUCOSAMINE 1500 COMPLEX PO) Take 1,500 mg by mouth 2 (two) times daily.    Yes Historical Provider, MD  hydroxypropyl methylcellulose / hypromellose (ISOPTO TEARS / GONIOVISC) 2.5 % ophthalmic solution Place 1 drop into both eyes 4 (four) times daily.   Yes Historical Provider, MD  loperamide (IMODIUM) 2 MG capsule Take 2 mg by mouth as needed for diarrhea or loose stools.   Yes Historical Provider, MD  LORazepam (ATIVAN) 0.5 MG tablet Take 1 tablet (0.5 mg total) by mouth 2 (two) times daily. 05/04/15  Yes Costin Karlyne Greenspan, MD  meloxicam (MOBIC) 7.5 MG  tablet Take 7.5 mg by mouth daily.   Yes Historical Provider, MD  metFORMIN (GLUCOPHAGE-XR) 500 MG 24 hr tablet Take 1,000 mg by mouth 2 (two) times daily.   Yes Historical Provider, MD  Multiple Vitamins-Minerals (EYE VITAMINS PO) Take 1 tablet by mouth daily. SMOTHERS FORMULA   Yes Historical Provider, MD  oxyCODONE (OXY IR/ROXICODONE) 5 MG immediate release tablet Take 1 tablet (5 mg total) by mouth every 6 (six) hours as needed for breakthrough pain. 05/04/15  Yes Costin Karlyne Greenspan, MD  polyethylene glycol (MIRALAX / GLYCOLAX) packet Take 17 g by mouth every other day.   Yes Historical Provider, MD  senna (SENOKOT) 8.6 MG TABS tablet Take 1 tablet (8.6 mg total) by mouth 2 (two) times daily. Patient taking differently: Take 1 tablet by mouth at bedtime.  06/06/14  Yes Domenic Polite, MD  sodium chloride (OCEAN) 0.65 % SOLN nasal spray Place 2 sprays into both nostrils 3 (three) times daily before meals.   Yes Historical Provider, MD  sotalol (BETAPACE) 80 MG tablet Take 80 mg by mouth 2 (two) times daily.   Yes Historical Provider, MD  tamsulosin (FLOMAX) 0.4 MG CAPS capsule Take 0.4 mg by mouth daily after supper.   Yes Historical Provider, MD  traMADol (ULTRAM) 50 MG tablet Take one tablet by mouth every morning; Take one tablet by mouth every night at bedtime as needed for pain 05/04/15  Yes Costin Karlyne Greenspan, MD  traZODone (DESYREL) 50 MG tablet Take 25 mg by mouth at bedtime.   Yes Historical Provider, MD  Omega-3 Fatty Acids (FISH OIL) 1200 MG CAPS Take 1,200 mg by mouth 2 (two) times daily.     Historical Provider, MD     Family History  Problem Relation Age of Onset  . Rectal cancer Father   . COPD Brother     Social History   Social History  . Marital Status: Widowed    Spouse Name: N/A  . Number of Children: N/A  . Years of Education: N/A   Occupational History  . retired    Social History Main Topics  . Smoking status: Former Smoker -- 0.50 packs/day for 63 years    Types:  Cigarettes    Quit date: 05/30/2014  . Smokeless tobacco: Never Used     Comment: smoke about 14-15 cigs qd  . Alcohol Use: No     Comment: quit 8 years ago  . Drug Use: No  . Sexual Activity: Not Asked   Other Topics Concern  . None   Social History Narrative     Review of Systems: A 12 point ROS discussed and pertinent positives are indicated in the HPI above.  All other systems are negative.  Review of Systems  Constitutional: Positive for activity change and fatigue.  Respiratory: Positive for shortness of breath. Negative for cough.  Gastrointestinal: Negative for abdominal pain.  Neurological: Positive for weakness.  Psychiatric/Behavioral: Positive for confusion.    Vital Signs: BP 121/63 mmHg  Pulse 58  Temp(Src) 98.5 F (36.9 C)  Resp 20  Ht '6\' 1"'$  (1.854 m)  Wt 191 lb (86.637 kg)  BMI 25.20 kg/m2  SpO2 94%  Physical Exam  Cardiovascular: Normal rate, regular rhythm and normal heart sounds.   Pulmonary/Chest: Effort normal and breath sounds normal.  Abdominal: Soft. Bowel sounds are normal. There is no tenderness.  Musculoskeletal: Normal range of motion.  Neurological: He is alert.  Skin: Skin is warm and dry.  Psychiatric: He has a normal mood and affect.  Consent signed with dtr at bedside  Nursing note and vitals reviewed.   Mallampati Score:  MD Evaluation Airway: WNL Heart: WNL Abdomen: WNL Chest/ Lungs: WNL ASA  Classification: 3 Mallampati/Airway Score: One  Imaging: Dg Chest 2 View  05/04/2015  CLINICAL DATA:  Sob and soreness in chest today  ,ptx EXAM: CHEST  2 VIEW COMPARISON:  05/03/2015 FINDINGS: Heart size is enlarged. There is increased left lung opacity, confluent at the left base. Patchy density also seen within the right lung base. Bilateral pleural effusions, left greater than right. Trace left apical pneumothorax appear stable. IMPRESSION: 1. Stable left pneumothorax. 2. Increased left lung opacity consistent with infiltrate.  Electronically Signed   By: Nolon Nations M.D.   On: 05/04/2015 10:24   Dg Chest 2 View  04/26/2015  CLINICAL DATA:  Shortness of breath, onset 3-4 days ago. EXAM: CHEST  2 VIEW COMPARISON:  Chest x-ray 06/03/2014 FINDINGS: There is a large left pleural effusion with near complete opacification of the left hemithorax. Probable underlying COPD. Scarring noted in the right base. There is cardiomegaly. Mitral valve annular calcifications noted. No acute bony abnormality. IMPRESSION: Large left pleural effusion with near complete opacification of the left hemithorax. COPD.  Right basilar scarring. Cardiomegaly. Electronically Signed   By: Rolm Baptise M.D.   On: 04/26/2015 15:40   Ct Chest Wo Contrast  04/28/2015  CLINICAL DATA:  81 year old male with pleural effusion status post thoracentesis. LEFT chest tube placed. EXAM: CT CHEST WITHOUT CONTRAST TECHNIQUE: Multidetector CT imaging of the chest was performed following the standard protocol without IV contrast. COMPARISON:  Radiograph 04/27/2015 FINDINGS: Mediastinum/Nodes: No axillary or supraclavicular lymphadenopathy. No mediastinal hilar adenopathy. No pericardial fluid. Mild thickening of the esophagus. Lobular calcification coronary artery calcification. Lungs/Pleura: LEFT chest tube in place with tiny anterior pneumothorax. Minimal pleural fluid within the LEFT pleural space. There is consolidation within the RIGHT lower lobe and lingula consists with pneumonia. There is a rounded lesion in the LEFT lower lobe anteriorly measuring 34 by 27 mm (image 45, series 2 there is mild reticulation peripherally in the RIGHT lung. Upper abdomen: Limited view of the liver, kidneys, pancreas are unremarkable. Normal adrenal glands. No aggressive osseous lesion. Musculoskeletal: No aggressive osseous lesion. IMPRESSION: 1. Very small anterior LEFT pneumothorax with chest tube at LEFT lung base. 2. Nodular consolidation within the lingula and the LEFT lower lobe  consistent with pneumonia versus atelectasis. 3. Rounded mass lesion at the LEFT lung base with differential including rounded pneumonia/atelectasis versus neoplasm. Recommend correlation with pleural fluid cytology. Electronically Signed   By: Suzy Bouchard M.D.   On: 04/28/2015 11:05   Dg Chest Port 1 View  05/03/2015  CLINICAL DATA:  81 year old male chest tube removal, pneumothorax after left thoracentesis. Initial encounter. EXAM: PORTABLE CHEST 1 VIEW COMPARISON:  05/02/2015 and earlier.  FINDINGS: Portable AP semi upright view at 0701 hours. Stable lung volumes. Continued confluent opacity at the left lung base. Left chest tube was removed yesterday. Trace left apical pneumothorax is stable. Stable cardiac size and mediastinal contours. No new pulmonary opacity. IMPRESSION: 1. Stable trace left apical pneumothorax. 2. Stable lung volumes with confluent left lung base opacity. 3. No new cardiopulmonary abnormality. Electronically Signed   By: Genevie Ann M.D.   On: 05/03/2015 08:16   Dg Chest Port 1 View  05/02/2015  CLINICAL DATA:  Left-sided chest tube removal. EXAM: PORTABLE CHEST 1 VIEW COMPARISON:  May 02, 2015. FINDINGS: Minimal left apical pneumothorax is noted. Stable left basilar opacity is noted concerning for pneumonia or atelectasis with associated pleural effusion. Stable minimal right basilar subsegmental atelectasis or scarring is noted. Stable cardiomediastinal silhouette. IMPRESSION: Minimal left apical pneumothorax is noted. Stable left basilar opacity is noted concerning for pneumonia or atelectasis with associated pleural effusion. Stable minimal right basilar subsegmental atelectasis or scarring is noted. Electronically Signed   By: Marijo Conception, M.D.   On: 05/02/2015 18:42   Dg Chest Port 1 View  05/02/2015  CLINICAL DATA:  Status post left chest tube removal EXAM: PORTABLE CHEST - 1 VIEW COMPARISON:  05/02/2015 FINDINGS: Cardiac shadow is within normal limits. Persistent  left basilar infiltrate is seen. No recurrent pneumothorax is noted following chest tube removal. The right lung remains clear. No acute bony abnormality is seen. IMPRESSION: No recurrent pneumothorax following chest tube removal. Electronically Signed   By: Inez Catalina M.D.   On: 05/02/2015 16:03   Dg Chest Port 1 View  05/02/2015  CLINICAL DATA:  Respiratory failure. EXAM: PORTABLE CHEST 1 VIEW COMPARISON:  05/01/2015. FINDINGS: Left chest tube in stable position. No pneumothorax noted on today's exam. Persistent left lower lobe infiltrate with small pleural effusion noted. Persistent cardiomegaly. No pulmonary venous congestion. IMPRESSION: 1. Left chest tube in stable position. No pneumothorax noted on today's exam. 2. Persistent left lower lobe infiltrate and small left pleural effusion. 3. Stable cardiomegaly. Electronically Signed   By: Marcello Moores  Register   On: 05/02/2015 07:22   Dg Chest Port 1 View  05/01/2015  CLINICAL DATA:  Follow-up pleural effusion EXAM: PORTABLE CHEST 1 VIEW COMPARISON:  Portable chest x-ray of April 30, 2015 FINDINGS: The lungs are adequately inflated. The interstitial markings remain increased diffusely. The faint pleural line remains visible between the lateral aspects of the left second and third ribs. The small caliber chest tube is present at the left lung base laterally. There is no pneumothorax nor large pleural effusion. The left hemidiaphragm remains obscured. The cardiac silhouette remains enlarged. The pulmonary vascularity is not clearly engorged. The bony thorax is unremarkable where visualized. IMPRESSION: Stable appearance of the chest since yesterday's study. Trace left apical pneumothorax. There remainspleural fluid layering posteriorly associated with left basilar atelectasis or pneumonia. Stable cardiomegaly and bilaterally increased interstitial markings. Electronically Signed   By: David  Martinique M.D.   On: 05/01/2015 07:36   Dg Chest Port 1  View  04/30/2015  CLINICAL DATA:  Pleural effusion EXAM: PORTABLE CHEST 1 VIEW COMPARISON:  04/27/2015 FINDINGS: Pigtail pleural catheter on the left is unchanged. Minimal left pneumothorax unchanged. Left effusion and left lower lobe infiltrate unchanged. COPD. Scarring in the right lung base unchanged. Cardiac enlargement without heart failure. IMPRESSION: Minimal left pneumothorax is unchanged. Left lower lobe infiltrate and effusion also unchanged. No new findings since the prior chest x-ray. Electronically Signed   By: Juanda Crumble  Carlis Abbott M.D.   On: 04/30/2015 07:02   Dg Chest Port 1 View  04/27/2015  CLINICAL DATA:  Pleural effusion, chest tube in place. EXAM: PORTABLE CHEST 1 VIEW COMPARISON:  Earlier this day at 1230 hour FINDINGS: Pigtail catheter in the lateral left hemithorax with continued decrease in left pleural effusion, with small to moderate volume of pleural fluid remaining. There is a questionable tiny left pneumothorax versus artifact in the upper lateral hemithorax. Adjacent atelectasis or consolidation in the left mid and lower lung zone. Probable peripheral fibrosis in the right lung, unchanged from prior exam. There is atherosclerosis of the thoracic aorta. IMPRESSION: Decreased size of left pleural effusion with small to moderate volume of persistent pleural fluid. Question of tiny left pneumothorax versus artifact, left pigtail catheter in place. Patchy opacity in the left mid and lower lung zone, atelectasis versus pneumonia. Electronically Signed   By: Jeb Levering M.D.   On: 04/27/2015 20:45   Dg Chest Port 1 View  04/27/2015  CLINICAL DATA:  81 year old male with pleural effusion. EXAM: PORTABLE CHEST 1 VIEW COMPARISON:  Chest x-ray 04/27/2015. FINDINGS: New pigtail drainage catheter in lower aspect of the left hemithorax. There continues to be a large left-sided pleural effusion. No definite pneumothorax component. Minimal aerated lung in the apex of the left upper lobe. The  remainder of the left lung is either completely collapsed or consolidated. Coarse interstitial markings are again noted throughout the right lung, particularly at the right lung base, likely related to chronic interstitial lung disease. No acute consolidative disease in the right lung. No right pleural effusion. No evidence of pulmonary edema. Cardiac silhouette and mediastinal contours are largely obscured by in the opacified left hemithorax. Extensive atherosclerotic calcifications in the thoracic aorta. The IMPRESSION: 1. Status post left-sided chest tube placement with slight decrease in large left pleural effusion. Extensive atelectasis and/or consolidation throughout the left lung. 2. Coarse interstitial markings in the right lung suggesting underlying interstitial lung disease. 3. Atherosclerosis. Electronically Signed   By: Vinnie Langton M.D.   On: 04/27/2015 12:51   Dg Chest Portable 1 View  04/27/2015  CLINICAL DATA:  Shortness of breath, pleural effusion, left chest pain EXAM: PORTABLE CHEST 1 VIEW COMPARISON:  04/18/2015 FINDINGS: Near complete opacification of the left hemithorax, likely reflecting increasing large left pleural effusion. Mild subpleural reticulation/fibrosis at the right lung base. No pneumothorax is seen. The heart is normal in size but partially obscured. IMPRESSION: New complete opacification of the left hemithorax, likely reflecting increasing large left pleural effusion. Electronically Signed   By: Julian Hy M.D.   On: 04/27/2015 10:53   Dg Chest Port 1 View  04/26/2015  CLINICAL DATA:  Left pleural effusion, post thoracentesis. Rule out pneumothorax. EXAM: PORTABLE CHEST 1 VIEW COMPARISON:  Earlier this day at 1530 hour FINDINGS: Portable exam at 1946 hour: Large left pleural effusion, however slightly decreased in size from prior exam with decreased apical component. Small amount of aerated lung is seen in the left upper hemithorax. No definite pneumothorax. Mild  chronic change in the right lung without right pleural effusion. Cardiomediastinal contours are unchanged, partially obscured. IMPRESSION: No pneumothorax. Persistent large left pleural effusion, slightly decreased in size post thoracentesis. Electronically Signed   By: Jeb Levering M.D.   On: 04/26/2015 20:11    Labs:  CBC:  Recent Labs  04/26/15 1421 04/27/15 0343 04/27/15 2120 05/19/15 0710  WBC 5.8 6.0 7.7 5.2  HGB 11.9* 11.5* 11.9* 10.4*  HCT 36.2* 35.8* 36.3*  32.9*  PLT 178 192 182 232    COAGS:  Recent Labs  05/31/14 0315 04/26/15 1652 05/19/15 0710  INR 1.06 1.06 1.09  APTT  --   --  34    BMP:  Recent Labs  06/06/14 0630 04/26/15 1421 04/27/15 0343 05/19/15 0710  NA 140 134* 134* 133*  K 4.2 4.9 4.6 5.2*  CL 102 99* 98* 96*  CO2 '27 25 30 28  '$ GLUCOSE 221* 186* 168* 185*  BUN '22 16 17 10  '$ CALCIUM 8.6 8.6* 8.8* 8.7*  CREATININE 0.81 1.09 1.06 0.92  GFRNONAA 79* 59* >60 >60  GFRAA >90 >60 >60 >60    LIVER FUNCTION TESTS:  Recent Labs  05/31/14 0315 06/01/14 0259 04/26/15 1958  BILITOT 0.4 0.5  --   AST 17 24  --   ALT 12 12  --   ALKPHOS 94 84  --   PROT 6.9 6.7 6.2*  ALBUMIN 3.3* 3.1*  --     TUMOR MARKERS: No results for input(s): AFPTM, CEA, CA199, CHROMGRNA in the last 8760 hours.  Assessment and Plan:  Recurrent L malignant pleural effusion Now with Hospice care Scheduled for L PleurX catheter placement Risks and Benefits discussed with the patient including bleeding, infection, damage to adjacent structures, pneumothorax All of the patient's questions were answered, patient and family are agreeable to proceed. Consent signed and in chart.   Thank you for this interesting consult.  I greatly enjoyed meeting Aamir Mclinden and look forward to participating in their care.  A copy of this report was sent to the requesting provider on this date.  Signed: Anastashia Westerfeld A 05/19/2015, 8:11 AM    I spent a total of  30 Minutes    in face to face in clinical consultation, greater than 50% of which was counseling/coordinating care for L PleurX catheter placement

## 2015-05-19 NOTE — Sedation Documentation (Signed)
Discharged to [facility] with family to wait for his transportation back to [place].

## 2015-05-30 DEATH — deceased

## 2015-06-08 LAB — AFB CULTURE WITH SMEAR (NOT AT ARMC): ACID FAST SMEAR: NONE SEEN

## 2015-12-19 IMAGING — CT CT CERVICAL SPINE W/O CM
4 of 5 series · 16 of 33 positions shown, 18 images · non-contrast
Comparison: 10/01/2013.

CLINICAL DATA: Right neck pain after falling today and hitting his
shoulder against a door jamb.

EXAM:
CT HEAD WITHOUT CONTRAST
CT CERVICAL SPINE WITHOUT CONTRAST
TECHNIQUE: Multidetector CT imaging of the head and cervical spine was
performed following the standard protocol without intravenous
contrast. Multiplanar CT image reconstructions of the cervical spine
were also generated.

[Series 5: c_spine 2.0 i40s 3 · axial · 0.26mm/px · z∈[-238,-136]mm · 4 of 87 slices shown]
[im 18/87  bone]
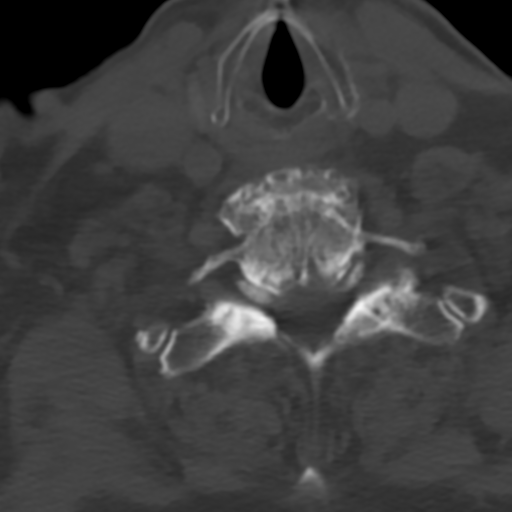
[im 35/87  bone]
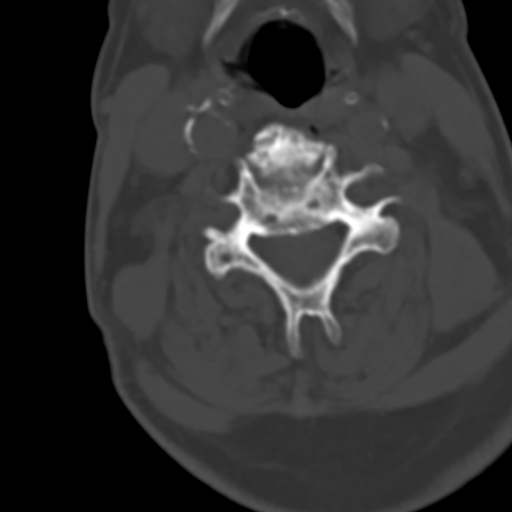
[im 52/87  bone]
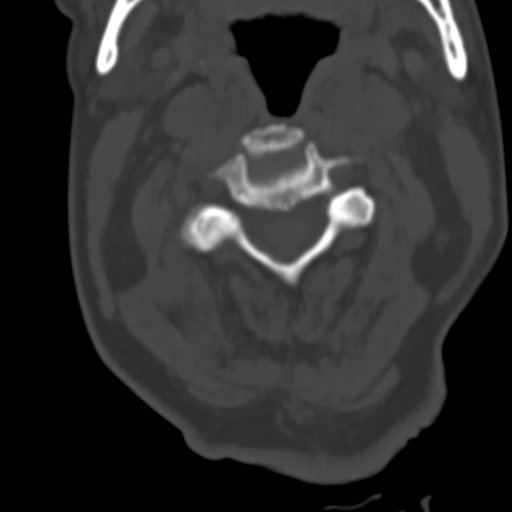
[im 69/87  bone]
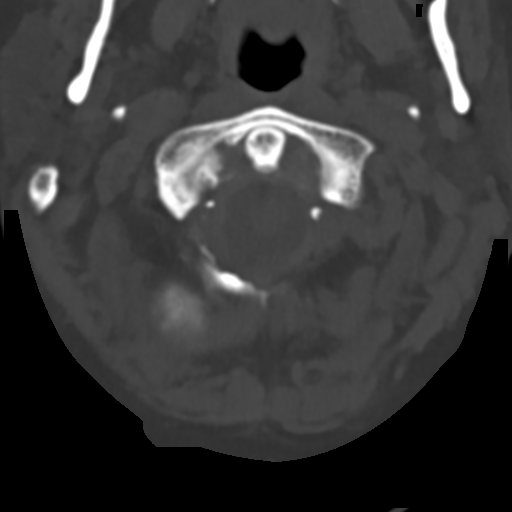

[Series 7: coronals · coronal · 0.36mm/px · 3 of 48 slices shown]
[im 10/48  bone]
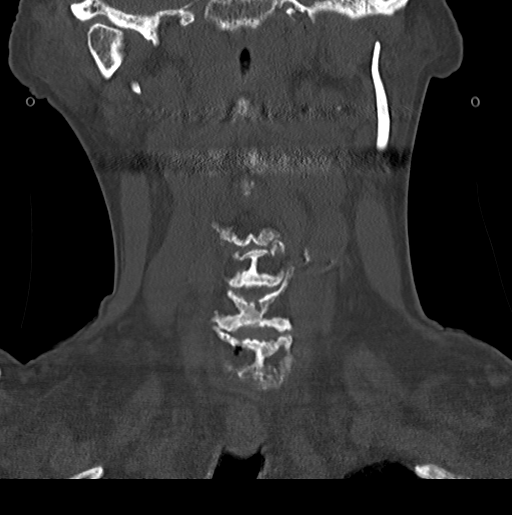
[im 19/48  bone]
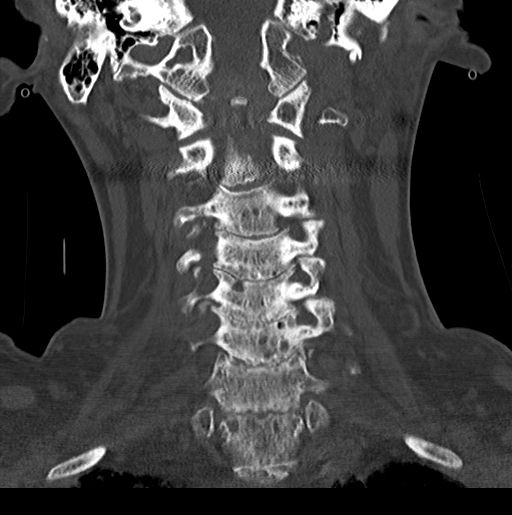
[im 29/48  bone]
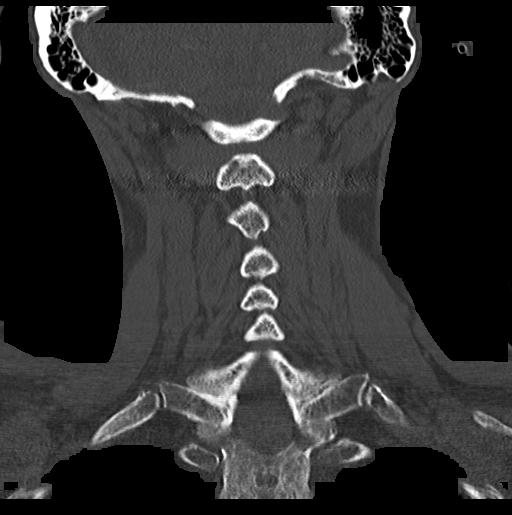

[Series 8: sagittals · sagittal · 0.33mm/px · 5 of 52 slices shown, 6 images]
[im 18/52  bone]
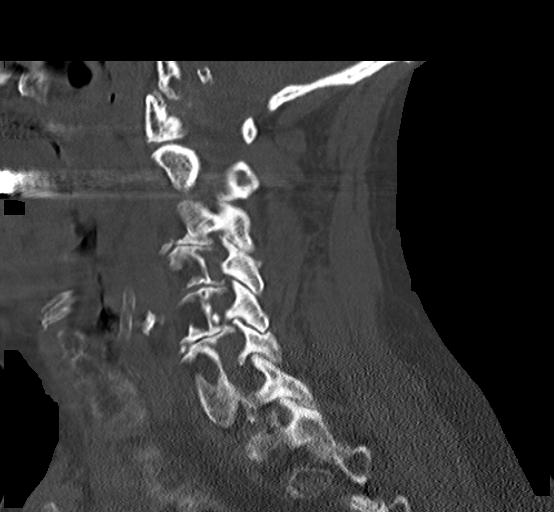
[im 22/52  bone]
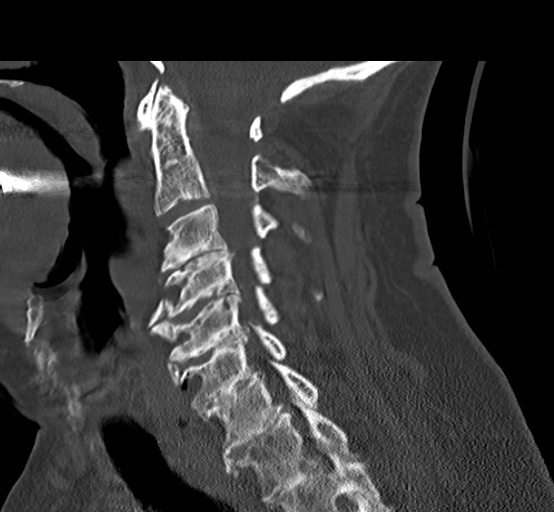
[im 26/52  soft-tissue]
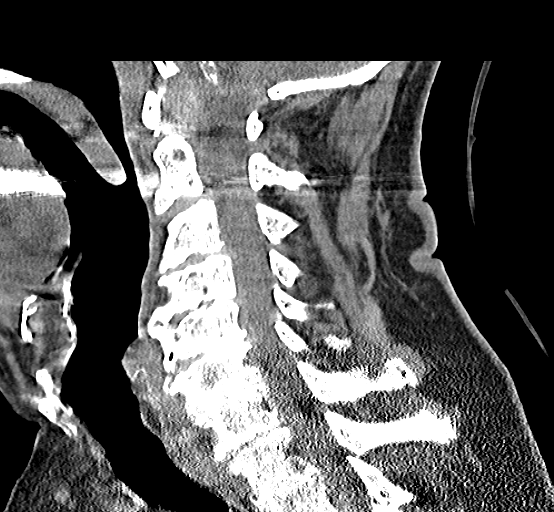
[im 26/52  bone]
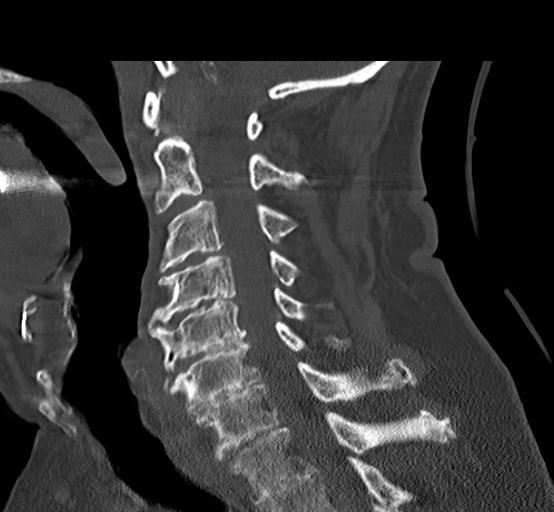
[im 30/52  bone]
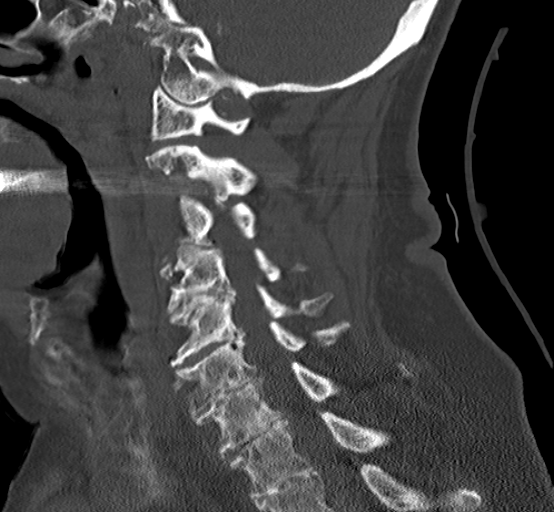
[im 35/52  bone]
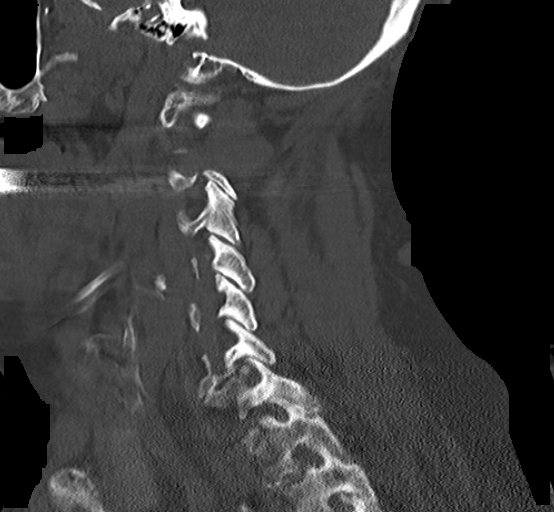

[Series 9: orthogonals · axial · 0.32mm/px · z∈[-262,-150]mm · 4 of 95 slices shown, 5 images]
[im 19/95  soft-tissue]
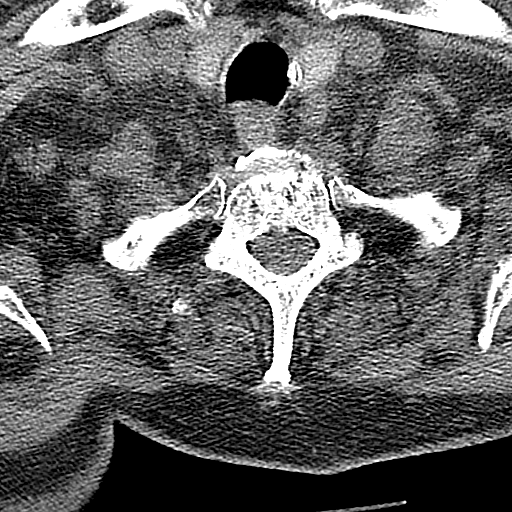
[im 19/95  bone]
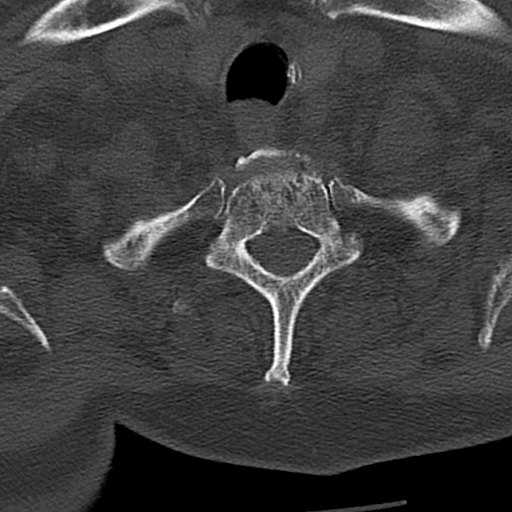
[im 38/95  bone]
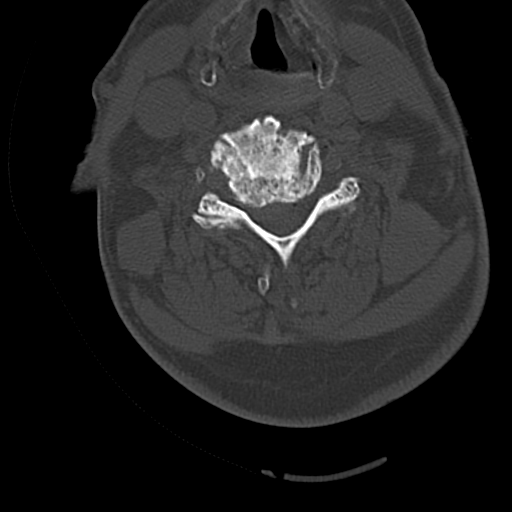
[im 57/95  bone]
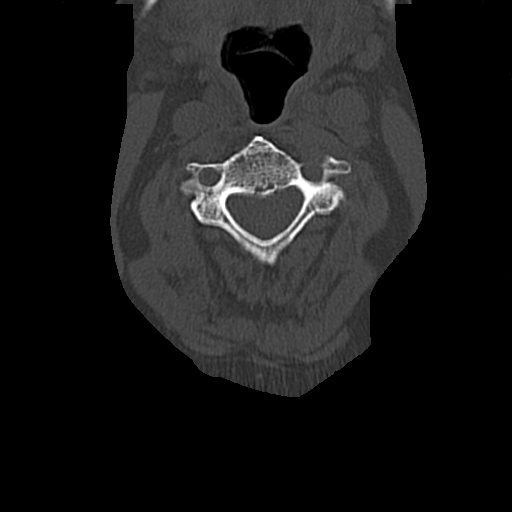
[im 76/95  bone]
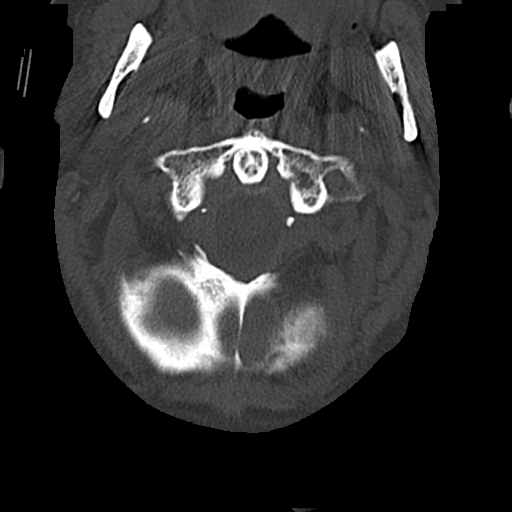

[16 of 33 positions shown; findings below may reference images not displayed]

FINDINGS: CT HEAD FINDINGS

Diffusely enlarged ventricles and subarachnoid spaces. Patchy white
matter low density in both cerebral hemispheres. No skull fracture,
intracranial hemorrhage or paranasal sinus air-fluid levels. Dense
arterial calcifications at the skull base.

CT CERVICAL SPINE FINDINGS

Extensive multilevel cervical spine degenerative changes. No
prevertebral soft tissue swelling, fractures or subluxations.
Bilateral vertebral and carotid artery atheromatous calcifications.
Bullous changes at both lung apices.
IMPRESSION: 1. No skull fracture or intracranial hemorrhage.
2. No cervical spine fracture or subluxation.
3. Stable mid atrophy and chronic small vessel white matter ischemic
changes.
4. Stable marked cervical spine degenerative changes.
5. COPD.

## 2015-12-19 IMAGING — CR DG THORACIC SPINE 2V
3 series · 3 of 3 positions shown · non-contrast
Comparison: None.

CLINICAL DATA: Fall, mid back pain

EXAM:
THORACIC SPINE - 2 VIEW

[t t-spine a.p.]
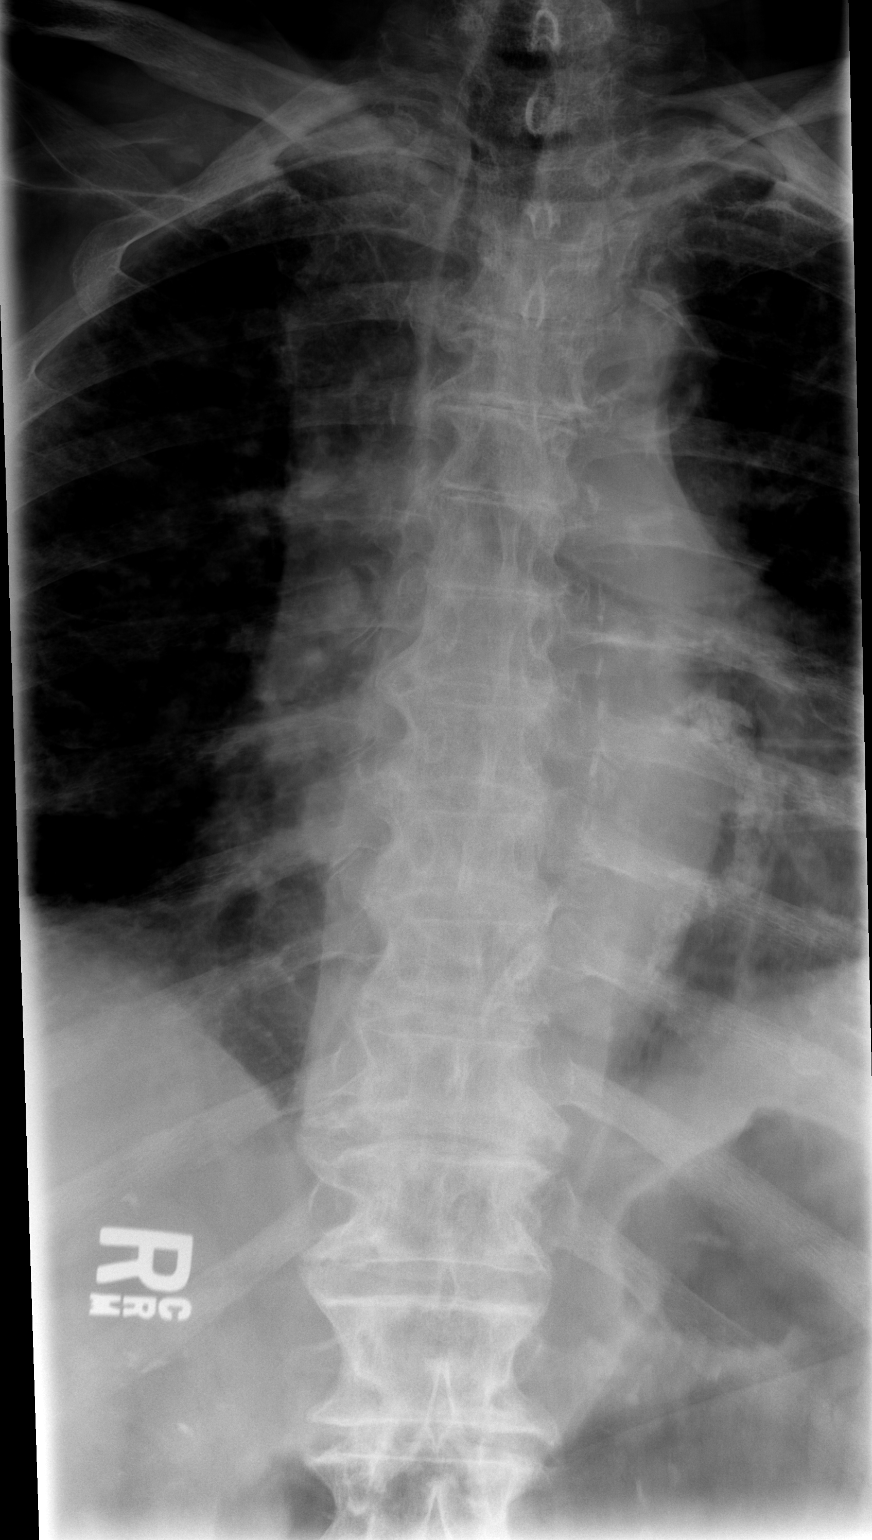

[t t-spine lat * (1 of 2)]
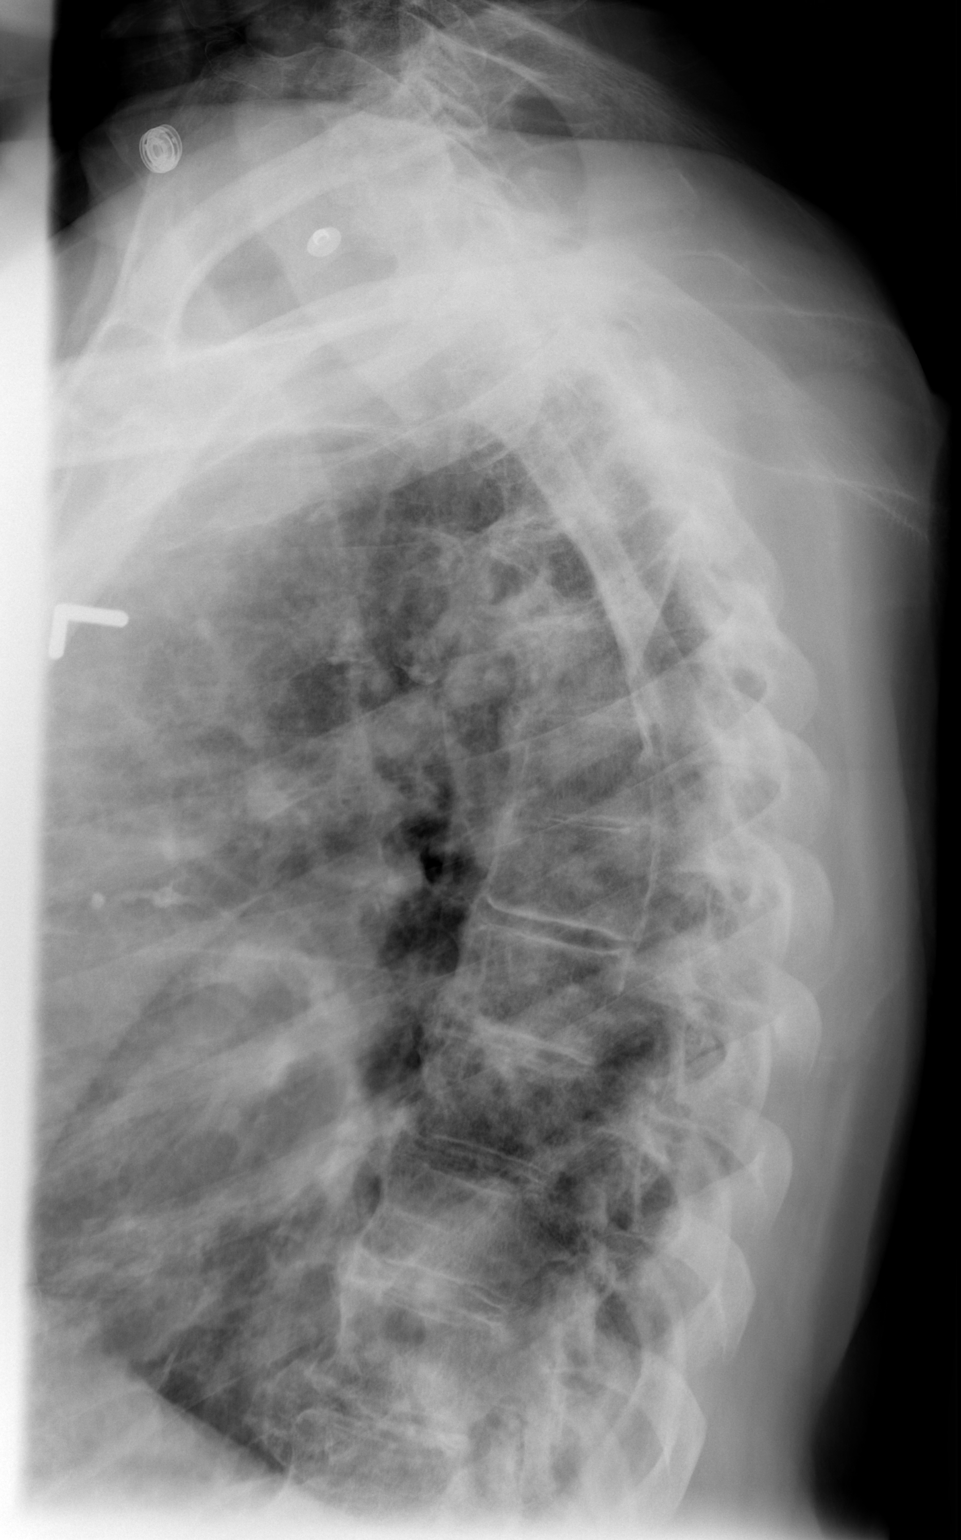

[t t-spine lat * (2 of 2)]
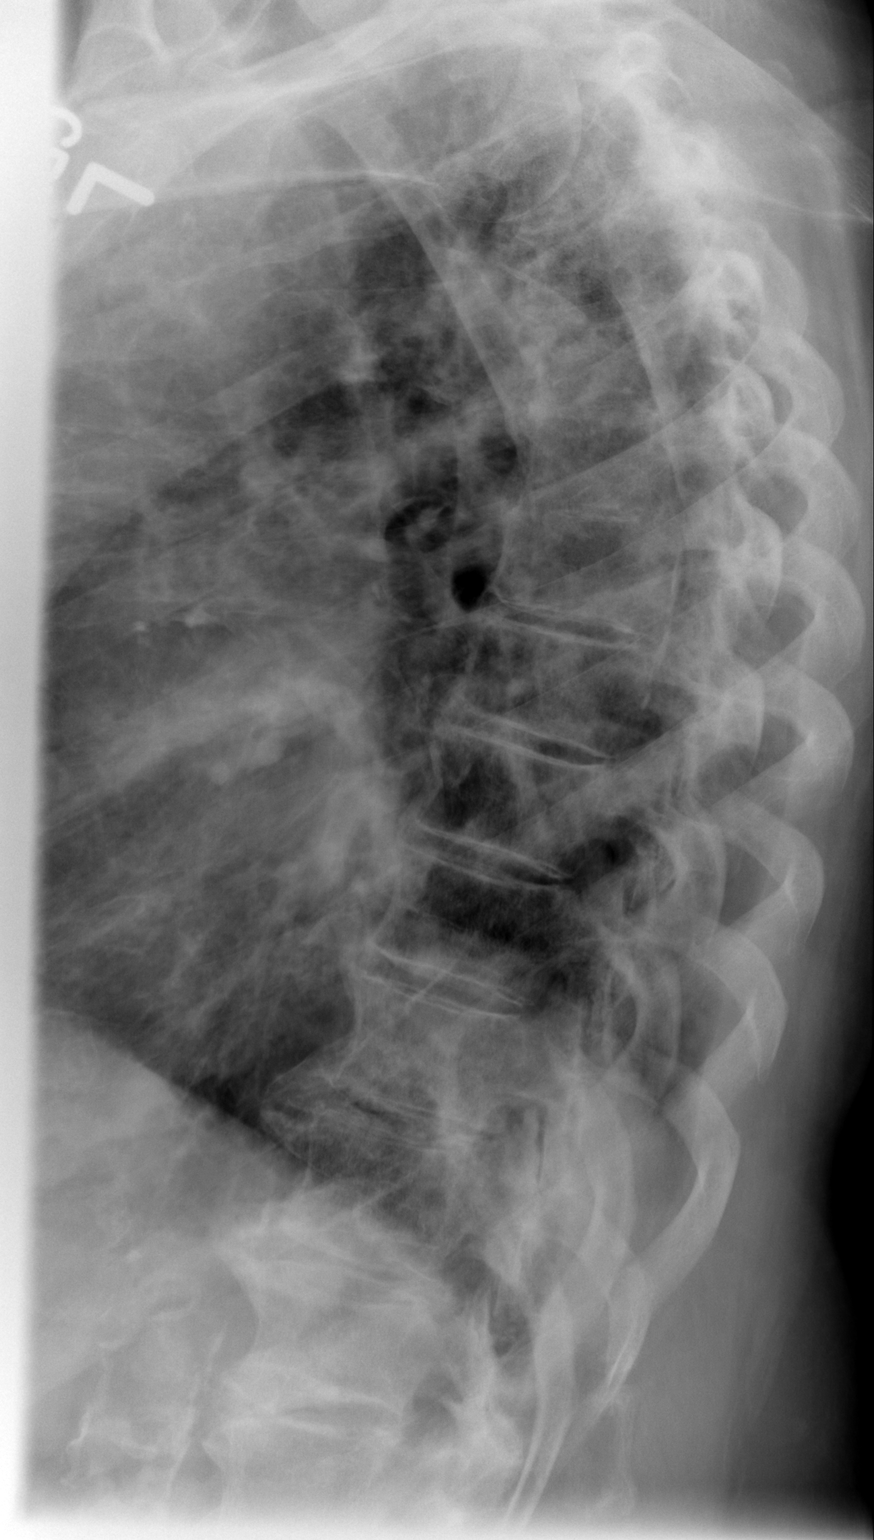

[3 of 3 positions shown; findings below may reference images not displayed]

FINDINGS: Normal thoracic kyphosis.

No evidence of fracture dislocation. Vertebral body heights are
maintained.

Moderate multilevel degenerative changes.

Vascular calcifications.

Visualized lungs are clear.
IMPRESSION: No fracture or dislocation is seen.

Moderate multilevel degenerative changes.
